# Patient Record
Sex: Male | Born: 1935
Health system: Southern US, Community
[De-identification: ages and names within clinical notes are randomized; demographics above are authoritative.]

## PROBLEM LIST (undated history)

## (undated) DIAGNOSIS — J449 Chronic obstructive pulmonary disease, unspecified: Secondary | ICD-10-CM

## (undated) DIAGNOSIS — G459 Transient cerebral ischemic attack, unspecified: Secondary | ICD-10-CM

## (undated) DIAGNOSIS — E079 Disorder of thyroid, unspecified: Secondary | ICD-10-CM

## (undated) DIAGNOSIS — E785 Hyperlipidemia, unspecified: Secondary | ICD-10-CM

## (undated) DIAGNOSIS — J45909 Unspecified asthma, uncomplicated: Secondary | ICD-10-CM

## (undated) DIAGNOSIS — I350 Nonrheumatic aortic (valve) stenosis: Secondary | ICD-10-CM

## (undated) DIAGNOSIS — I359 Nonrheumatic aortic valve disorder, unspecified: Secondary | ICD-10-CM

## (undated) DIAGNOSIS — I639 Cerebral infarction, unspecified: Secondary | ICD-10-CM

## (undated) DIAGNOSIS — C4490 Unspecified malignant neoplasm of skin, unspecified: Secondary | ICD-10-CM

## (undated) DIAGNOSIS — K922 Gastrointestinal hemorrhage, unspecified: Secondary | ICD-10-CM

## (undated) DIAGNOSIS — H269 Unspecified cataract: Secondary | ICD-10-CM

## (undated) DIAGNOSIS — I610 Nontraumatic intracerebral hemorrhage in hemisphere, subcortical: Secondary | ICD-10-CM

## (undated) DIAGNOSIS — C189 Malignant neoplasm of colon, unspecified: Secondary | ICD-10-CM

## (undated) DIAGNOSIS — E039 Hypothyroidism, unspecified: Secondary | ICD-10-CM

## (undated) DIAGNOSIS — I10 Essential (primary) hypertension: Secondary | ICD-10-CM

## (undated) DIAGNOSIS — K219 Gastro-esophageal reflux disease without esophagitis: Secondary | ICD-10-CM

## (undated) DIAGNOSIS — H409 Unspecified glaucoma: Secondary | ICD-10-CM

## (undated) DIAGNOSIS — N189 Chronic kidney disease, unspecified: Secondary | ICD-10-CM

## (undated) DIAGNOSIS — D500 Iron deficiency anemia secondary to blood loss (chronic): Secondary | ICD-10-CM

## (undated) HISTORY — PX: COLON SURGERY: SHX602

## (undated) HISTORY — DX: Chronic obstructive pulmonary disease, unspecified: J44.9

## (undated) HISTORY — DX: Cerebral infarction, unspecified: I63.9

## (undated) HISTORY — DX: Malignant neoplasm of colon, unspecified: C18.9

## (undated) HISTORY — DX: Unspecified glaucoma: H40.9

## (undated) HISTORY — DX: Unspecified cataract: H26.9

## (undated) HISTORY — DX: Iron deficiency anemia secondary to blood loss (chronic): D50.0

## (undated) HISTORY — PX: EYE SURGERY: SHX253

## (undated) HISTORY — DX: Gastrointestinal hemorrhage, unspecified: K92.2

## (undated) HISTORY — PX: JOINT REPLACEMENT: SHX530

## (undated) HISTORY — PX: COLON RESECTION: SHX5231

## (undated) HISTORY — DX: Disorder of thyroid, unspecified: E07.9

## (undated) HISTORY — DX: Chronic kidney disease, unspecified: N18.9

## (undated) HISTORY — DX: Essential (primary) hypertension: I10

## (undated) HISTORY — PX: REPLACEMENT TOTAL KNEE: SUR1224

## (undated) HISTORY — DX: Hyperlipidemia, unspecified: E78.5

---

## 2011-04-16 DIAGNOSIS — C44621 Squamous cell carcinoma of skin of unspecified upper limb, including shoulder: Secondary | ICD-10-CM | POA: Diagnosis not present

## 2011-04-16 DIAGNOSIS — L905 Scar conditions and fibrosis of skin: Secondary | ICD-10-CM | POA: Diagnosis not present

## 2011-06-30 DIAGNOSIS — E785 Hyperlipidemia, unspecified: Secondary | ICD-10-CM | POA: Diagnosis not present

## 2011-06-30 DIAGNOSIS — E039 Hypothyroidism, unspecified: Secondary | ICD-10-CM | POA: Diagnosis not present

## 2011-06-30 DIAGNOSIS — I10 Essential (primary) hypertension: Secondary | ICD-10-CM | POA: Diagnosis not present

## 2011-07-02 DIAGNOSIS — H02839 Dermatochalasis of unspecified eye, unspecified eyelid: Secondary | ICD-10-CM | POA: Diagnosis not present

## 2011-07-02 DIAGNOSIS — H0289 Other specified disorders of eyelid: Secondary | ICD-10-CM | POA: Diagnosis not present

## 2011-07-02 DIAGNOSIS — H4011X0 Primary open-angle glaucoma, stage unspecified: Secondary | ICD-10-CM | POA: Diagnosis not present

## 2011-07-02 DIAGNOSIS — H409 Unspecified glaucoma: Secondary | ICD-10-CM | POA: Diagnosis not present

## 2011-07-02 DIAGNOSIS — Z961 Presence of intraocular lens: Secondary | ICD-10-CM | POA: Diagnosis not present

## 2011-07-09 DIAGNOSIS — L578 Other skin changes due to chronic exposure to nonionizing radiation: Secondary | ICD-10-CM | POA: Diagnosis not present

## 2011-07-09 DIAGNOSIS — L570 Actinic keratosis: Secondary | ICD-10-CM | POA: Diagnosis not present

## 2011-07-09 DIAGNOSIS — L820 Inflamed seborrheic keratosis: Secondary | ICD-10-CM | POA: Diagnosis not present

## 2011-07-09 DIAGNOSIS — Z85828 Personal history of other malignant neoplasm of skin: Secondary | ICD-10-CM | POA: Diagnosis not present

## 2011-07-30 DIAGNOSIS — R972 Elevated prostate specific antigen [PSA]: Secondary | ICD-10-CM | POA: Diagnosis not present

## 2011-08-06 DIAGNOSIS — H905 Unspecified sensorineural hearing loss: Secondary | ICD-10-CM | POA: Diagnosis not present

## 2011-08-18 DIAGNOSIS — R0989 Other specified symptoms and signs involving the circulatory and respiratory systems: Secondary | ICD-10-CM | POA: Diagnosis not present

## 2011-08-18 DIAGNOSIS — I10 Essential (primary) hypertension: Secondary | ICD-10-CM | POA: Diagnosis not present

## 2011-08-18 DIAGNOSIS — R9430 Abnormal result of cardiovascular function study, unspecified: Secondary | ICD-10-CM | POA: Diagnosis not present

## 2011-08-19 DIAGNOSIS — H903 Sensorineural hearing loss, bilateral: Secondary | ICD-10-CM | POA: Diagnosis not present

## 2011-12-31 DIAGNOSIS — H2640 Unspecified secondary cataract: Secondary | ICD-10-CM | POA: Diagnosis not present

## 2011-12-31 DIAGNOSIS — Z961 Presence of intraocular lens: Secondary | ICD-10-CM | POA: Diagnosis not present

## 2011-12-31 DIAGNOSIS — H409 Unspecified glaucoma: Secondary | ICD-10-CM | POA: Diagnosis not present

## 2011-12-31 DIAGNOSIS — H4011X0 Primary open-angle glaucoma, stage unspecified: Secondary | ICD-10-CM | POA: Diagnosis not present

## 2011-12-31 DIAGNOSIS — H01009 Unspecified blepharitis unspecified eye, unspecified eyelid: Secondary | ICD-10-CM | POA: Diagnosis not present

## 2011-12-31 DIAGNOSIS — H02839 Dermatochalasis of unspecified eye, unspecified eyelid: Secondary | ICD-10-CM | POA: Diagnosis not present

## 2011-12-31 DIAGNOSIS — H0289 Other specified disorders of eyelid: Secondary | ICD-10-CM | POA: Diagnosis not present

## 2011-12-31 DIAGNOSIS — Z9849 Cataract extraction status, unspecified eye: Secondary | ICD-10-CM | POA: Diagnosis not present

## 2012-01-08 DIAGNOSIS — J9819 Other pulmonary collapse: Secondary | ICD-10-CM | POA: Diagnosis not present

## 2012-01-08 DIAGNOSIS — R918 Other nonspecific abnormal finding of lung field: Secondary | ICD-10-CM | POA: Diagnosis not present

## 2012-01-08 DIAGNOSIS — J984 Other disorders of lung: Secondary | ICD-10-CM | POA: Diagnosis not present

## 2012-01-12 DIAGNOSIS — Z23 Encounter for immunization: Secondary | ICD-10-CM | POA: Diagnosis not present

## 2012-01-12 DIAGNOSIS — J45909 Unspecified asthma, uncomplicated: Secondary | ICD-10-CM | POA: Diagnosis not present

## 2012-01-13 DIAGNOSIS — L578 Other skin changes due to chronic exposure to nonionizing radiation: Secondary | ICD-10-CM | POA: Diagnosis not present

## 2012-01-13 DIAGNOSIS — Z85828 Personal history of other malignant neoplasm of skin: Secondary | ICD-10-CM | POA: Diagnosis not present

## 2012-01-13 DIAGNOSIS — L821 Other seborrheic keratosis: Secondary | ICD-10-CM | POA: Diagnosis not present

## 2012-01-13 DIAGNOSIS — L538 Other specified erythematous conditions: Secondary | ICD-10-CM | POA: Diagnosis not present

## 2012-02-04 DIAGNOSIS — Z96659 Presence of unspecified artificial knee joint: Secondary | ICD-10-CM | POA: Diagnosis not present

## 2012-02-04 DIAGNOSIS — Z471 Aftercare following joint replacement surgery: Secondary | ICD-10-CM | POA: Diagnosis not present

## 2012-02-04 DIAGNOSIS — Z09 Encounter for follow-up examination after completed treatment for conditions other than malignant neoplasm: Secondary | ICD-10-CM | POA: Diagnosis not present

## 2012-06-16 ENCOUNTER — Ambulatory Visit (INDEPENDENT_AMBULATORY_CARE_PROVIDER_SITE_OTHER): Payer: Medicare Other | Admitting: Sports Medicine

## 2012-06-16 ENCOUNTER — Encounter: Payer: Self-pay | Admitting: Sports Medicine

## 2012-06-16 VITALS — BP 151/87 | HR 69 | Ht 70.0 in | Wt 203.0 lb

## 2012-06-16 DIAGNOSIS — E785 Hyperlipidemia, unspecified: Secondary | ICD-10-CM | POA: Insufficient documentation

## 2012-06-16 DIAGNOSIS — E039 Hypothyroidism, unspecified: Secondary | ICD-10-CM | POA: Diagnosis not present

## 2012-06-16 DIAGNOSIS — Z299 Encounter for prophylactic measures, unspecified: Secondary | ICD-10-CM

## 2012-06-16 DIAGNOSIS — Z0000 Encounter for general adult medical examination without abnormal findings: Secondary | ICD-10-CM | POA: Diagnosis not present

## 2012-06-16 DIAGNOSIS — H409 Unspecified glaucoma: Secondary | ICD-10-CM | POA: Insufficient documentation

## 2012-06-16 DIAGNOSIS — J309 Allergic rhinitis, unspecified: Secondary | ICD-10-CM

## 2012-06-16 DIAGNOSIS — K219 Gastro-esophageal reflux disease without esophagitis: Secondary | ICD-10-CM | POA: Insufficient documentation

## 2012-06-16 DIAGNOSIS — N400 Enlarged prostate without lower urinary tract symptoms: Secondary | ICD-10-CM | POA: Insufficient documentation

## 2012-06-16 DIAGNOSIS — J387 Other diseases of larynx: Secondary | ICD-10-CM

## 2012-06-16 DIAGNOSIS — I10 Essential (primary) hypertension: Secondary | ICD-10-CM | POA: Insufficient documentation

## 2012-06-16 DIAGNOSIS — J3089 Other allergic rhinitis: Secondary | ICD-10-CM | POA: Insufficient documentation

## 2012-06-16 DIAGNOSIS — J0100 Acute maxillary sinusitis, unspecified: Secondary | ICD-10-CM | POA: Insufficient documentation

## 2012-06-16 MED ORDER — OMEPRAZOLE 40 MG PO CPDR: 40.0000 mg | DELAYED_RELEASE_CAPSULE | Freq: Every day | ORAL | Status: DC

## 2012-06-16 MED ORDER — FINASTERIDE 1 MG PO TABS: 0.5000 mg | ORAL_TABLET | Freq: Every day | ORAL | Status: DC

## 2012-06-16 MED ORDER — AZITHROMYCIN 250 MG PO TABS: ORAL_TABLET | ORAL | Status: DC

## 2012-06-16 NOTE — Assessment & Plan Note (Signed)
-   Continue Flonase  °

## 2012-06-16 NOTE — Assessment & Plan Note (Signed)
Stable, checking TSH.

## 2012-06-16 NOTE — Assessment & Plan Note (Signed)
Continue current medications, refilling finasteride.

## 2012-06-16 NOTE — Assessment & Plan Note (Signed)
Physical exam performed today. Up-to-date on screening and vaccines. Colonoscopy 5 years ago was negative, Tdap 4 years ago, Pneumovax 5 years ago, Zostavax 3 years ago. Discussed PSA screening we will not proceed with this.

## 2012-06-16 NOTE — Assessment & Plan Note (Signed)
Continue eyedrops 

## 2012-06-16 NOTE — Assessment & Plan Note (Signed)
Continue current medications, recheck in one month.

## 2012-06-16 NOTE — Progress Notes (Signed)
Subjective:    CC: Establish care.   HPI: Marcus Orr is a very pleasant 79 year old male, he is my father-in-law's father. His current primary care physician passed away and he is looking for another one. He has no complaints with the exception of sinus drainage and pressure as well as hoarseness and throat clearing during the day.  Hypertension: Controlled.  Hypothyroidism: Controlled.  Hyperlipidemia: Controlled in the past.  Glaucoma: Controlled with current medications.  Perennial rhinitis: Controlled fairly well but symptoms are only at night, controlled with Flonase.  BPH: Well controlled with terazosin and finasteride.  Preventative measures: Up-to-date, see assessment and plan. Patient does desire complete physical today.  Past medical history, Surgical history, Family history not pertinant except as noted below, Social history, Allergies, and medications have been entered into the medical record, reviewed, and no changes needed.   Review of Systems: No headache, visual changes, nausea, vomiting, diarrhea, constipation, dizziness, abdominal pain, skin rash, fevers, chills, night sweats, swollen lymph nodes, weight loss, chest pain, body aches, joint swelling, muscle aches, shortness of breath, mood changes, visual or auditory hallucinations.  Objective:    General Appearance: Alert, well developed and well nourished, cooperative, no distress.  Head: Normocephalic, no obvious abnormality  Eyes: PERRL, EOM's intact, conjunctiva and corneas clear.  Nose: Nares symmetrical, septum midline, mucosa pink, clear watery discharge; no sinus tenderness  Throat: Lips, tongue, and mucosa are moist, pink, and intact; teeth intact  Neck: Supple, symmetrical, trachea midline, no adenopathy; thyroid: no enlargement, symmetric,no tenderness/mass/nodules; no carotid bruit, no JVD  Back: Symmetrical, no curvature, ROM normal, no CVA tenderness  Chest/Breast: No mass or tenderness  Lungs: Clear to  auscultation bilaterally, respirations unlabored  Heart: Normal PMI, no lower extremity edema, regular rate & rhythm, S1 and S2 normal, no murmurs, rubs, or gallops. Abdomen: Soft, non-tender, bowel sounds active all four quadrants, no mass, or organomegaly  Musculoskeletal: All joints, extremities examined, non-tender and unremarkable.  Lymphatic: No adenopathy  Skin/Hair/Nails: Skin warm, dry, and intact, no rashes or abnormal dyspigmentation  Neurologic: Alert and oriented x3, no cranial nerve deficits, normal strength and tone, gait steady   Impression and Recommendations:   The patient was counselled, risk factors were discussed, anticipatory guidance given.

## 2012-06-16 NOTE — Assessment & Plan Note (Signed)
Continue simvastatin. Checking lipids and CMET.

## 2012-06-16 NOTE — Assessment & Plan Note (Signed)
Continue Flonase, adding azithromycin 

## 2012-06-16 NOTE — Assessment & Plan Note (Addendum)
With excessive throat clearing and some nasal drainage. Adding Prilosec. Recheck in one month.

## 2012-06-17 LAB — COMPREHENSIVE METABOLIC PANEL
ALT: 18 U/L (ref 0–53)
Albumin: 4.4 g/dL (ref 3.5–5.2)
CO2: 27 mEq/L (ref 19–32)
Chloride: 101 mEq/L (ref 96–112)
Glucose, Bld: 82 mg/dL (ref 70–99)
Potassium: 3.9 mEq/L (ref 3.5–5.3)
Sodium: 138 mEq/L (ref 135–145)
Total Protein: 7.4 g/dL (ref 6.0–8.3)

## 2012-06-17 LAB — LIPID PANEL
Cholesterol: 107 mg/dL (ref 0–200)
HDL: 39 mg/dL — ABNORMAL LOW (ref >39)
LDL Cholesterol: 47 mg/dL (ref 0–99)
Total CHOL/HDL Ratio: 2.7 ratio
Triglycerides: 104 mg/dL (ref <150)
VLDL: 21 mg/dL (ref 0–40)

## 2012-06-17 LAB — CBC
HCT: 40.8 % (ref 39.0–52.0)
Hemoglobin: 14.2 g/dL (ref 13.0–17.0)
MCH: 30.1 pg (ref 26.0–34.0)
MCHC: 34.8 g/dL (ref 30.0–36.0)
MCV: 86.6 fL (ref 78.0–100.0)
Platelets: 299 K/uL (ref 150–400)
RBC: 4.71 MIL/uL (ref 4.22–5.81)
RDW: 14.2 % (ref 11.5–15.5)
WBC: 8.9 K/uL (ref 4.0–10.5)

## 2012-06-17 LAB — TESTOSTERONE, FREE, TOTAL, SHBG
Sex Hormone Binding: 45 nmol/L (ref 13–71)
Testosterone, Free: 40 pg/mL — ABNORMAL LOW (ref 47.0–244.0)
Testosterone-% Free: 1.6 % (ref 1.6–2.9)
Testosterone: 252 ng/dL — ABNORMAL LOW (ref 300–890)

## 2012-06-17 LAB — COMPREHENSIVE METABOLIC PANEL WITH GFR
AST: 21 U/L (ref 0–37)
Alkaline Phosphatase: 65 U/L (ref 39–117)
BUN: 24 mg/dL — ABNORMAL HIGH (ref 6–23)
Calcium: 9.7 mg/dL (ref 8.4–10.5)
Creat: 1.54 mg/dL — ABNORMAL HIGH (ref 0.50–1.35)
Total Bilirubin: 0.6 mg/dL (ref 0.3–1.2)

## 2012-06-17 LAB — TSH: TSH: 3.457 u[IU]/mL (ref 0.350–4.500)

## 2012-06-17 LAB — VITAMIN D 25 HYDROXY (VIT D DEFICIENCY, FRACTURES): Vit D, 25-Hydroxy: 19 ng/mL — ABNORMAL LOW (ref 30–89)

## 2012-06-17 MED ORDER — VITAMIN D (ERGOCALCIFEROL) 1.25 MG (50000 UNIT) PO CAPS: 50000.0000 [IU] | ORAL_CAPSULE | ORAL | Status: DC

## 2012-06-17 NOTE — Addendum Note (Signed)
Addended by: Monica Becton on: 06/17/2012 08:41 AM   Modules accepted: Orders

## 2012-06-21 ENCOUNTER — Other Ambulatory Visit: Payer: Self-pay | Admitting: Sports Medicine

## 2012-07-06 ENCOUNTER — Encounter: Payer: Self-pay | Admitting: Sports Medicine

## 2012-07-06 NOTE — Progress Notes (Signed)
Records reviewed, echocardiogram, Holter monitor, and EKG report scanned in.

## 2012-07-14 ENCOUNTER — Encounter: Payer: Self-pay | Admitting: Sports Medicine

## 2012-07-14 ENCOUNTER — Ambulatory Visit (INDEPENDENT_AMBULATORY_CARE_PROVIDER_SITE_OTHER): Payer: Medicare Other | Admitting: Sports Medicine

## 2012-07-14 VITALS — BP 132/77 | HR 64 | Wt 200.0 lb

## 2012-07-14 DIAGNOSIS — K219 Gastro-esophageal reflux disease without esophagitis: Secondary | ICD-10-CM

## 2012-07-14 DIAGNOSIS — N289 Disorder of kidney and ureter, unspecified: Secondary | ICD-10-CM | POA: Diagnosis not present

## 2012-07-14 DIAGNOSIS — N400 Enlarged prostate without lower urinary tract symptoms: Secondary | ICD-10-CM

## 2012-07-14 MED ORDER — HYDROCHLOROTHIAZIDE 25 MG PO TABS: 25.0000 mg | ORAL_TABLET | Freq: Every day | ORAL | Status: DC

## 2012-07-14 MED ORDER — FLUTICASONE PROPIONATE 50 MCG/ACT NA SUSP: 2.0000 | Freq: Every day | NASAL | Status: DC

## 2012-07-14 MED ORDER — TERAZOSIN HCL 5 MG PO CAPS: 5.0000 mg | ORAL_CAPSULE | Freq: Every day | ORAL | Status: DC

## 2012-07-14 MED ORDER — LISINOPRIL 40 MG PO TABS: 20.0000 mg | ORAL_TABLET | Freq: Every day | ORAL | Status: DC

## 2012-07-14 MED ORDER — FINASTERIDE 1 MG PO TABS: 0.5000 mg | ORAL_TABLET | Freq: Every day | ORAL | Status: DC

## 2012-07-14 MED ORDER — SIMVASTATIN 40 MG PO TABS: 40.0000 mg | ORAL_TABLET | Freq: Every evening | ORAL | Status: DC

## 2012-07-14 MED ORDER — LEVOTHYROXINE SODIUM 75 MCG PO TABS: 75.0000 ug | ORAL_TABLET | Freq: Every day | ORAL | Status: DC

## 2012-07-14 MED ORDER — OMEPRAZOLE 40 MG PO CPDR: 40.0000 mg | DELAYED_RELEASE_CAPSULE | Freq: Every day | ORAL | Status: DC

## 2012-07-14 MED ORDER — DORZOLAMIDE HCL-TIMOLOL MAL 2-0.5 % OP SOLN: 1.0000 [drp] | Freq: Two times a day (BID) | OPHTHALMIC | Status: DC

## 2012-07-14 MED ORDER — ASPIRIN 81 MG PO TABS: 81.0000 mg | ORAL_TABLET | Freq: Every day | ORAL | Status: DC

## 2012-07-14 NOTE — Progress Notes (Signed)
  Subjective:    CC: Followup  HPI: Hypertension: Well controlled.  Hyperlipidemia: Well controlled.  BPH: Well controlled. Does note an occasional, possibly once a month mild presyncope and weakness day in the morning that resolves by midday. He does not wish to change any of his medicines as his symptoms are overall well controlled, and these episodes are rare. He denies any palpitations, chest pain, shortness of breath, nausea during these episodes.  Renal insufficiency: Was mild, but he does understand we need to recheck his creatinine.  Past medical history, Surgical history, Family history not pertinant except as noted below, Social history, Allergies, and medications have been entered into the medical record, reviewed, and no changes needed.   Review of Systems: No fevers, chills, night sweats, weight loss, chest pain, or shortness of breath.   Objective:    General: Well Developed, well nourished, and in no acute distress.  Neuro: Alert and oriented x3, extra-ocular muscles intact, sensation grossly intact.  HEENT: Normocephalic, atraumatic, pupils equal round reactive to light, neck supple, no masses, no lymphadenopathy, thyroid nonpalpable.  Skin: Warm and dry, no rashes. Cardiac: Regular rate and rhythm, no murmurs rubs or gallops.  Respiratory: Clear to auscultation bilaterally. Not using accessory muscles, speaking in full sentences. Impression and Recommendations:

## 2012-07-15 DIAGNOSIS — L570 Actinic keratosis: Secondary | ICD-10-CM | POA: Diagnosis not present

## 2012-07-15 DIAGNOSIS — L578 Other skin changes due to chronic exposure to nonionizing radiation: Secondary | ICD-10-CM | POA: Diagnosis not present

## 2012-07-15 DIAGNOSIS — Z85828 Personal history of other malignant neoplasm of skin: Secondary | ICD-10-CM | POA: Diagnosis not present

## 2012-07-15 DIAGNOSIS — L820 Inflamed seborrheic keratosis: Secondary | ICD-10-CM | POA: Diagnosis not present

## 2012-07-15 LAB — RENAL FUNCTION PANEL
Albumin: 4.3 g/dL (ref 3.5–5.2)
BUN: 20 mg/dL (ref 6–23)
CO2: 30 meq/L (ref 19–32)
Calcium: 9.3 mg/dL (ref 8.4–10.5)
Chloride: 101 mEq/L (ref 96–112)
Creat: 1.76 mg/dL — ABNORMAL HIGH (ref 0.50–1.35)
Glucose, Bld: 88 mg/dL (ref 70–99)
Phosphorus: 3 mg/dL (ref 2.3–4.6)
Potassium: 3.9 mEq/L (ref 3.5–5.3)
Sodium: 139 meq/L (ref 135–145)

## 2012-07-15 NOTE — Addendum Note (Signed)
Addended by: Monica Becton on: 07/15/2012 09:32 AM   Modules accepted: Orders, Medications

## 2012-07-15 NOTE — Assessment & Plan Note (Signed)
Worsened on recheck. Discontinuing lisinopril, I would like to recheck in a month. If continues to worsen we'll initiate the renal failure workup.

## 2012-07-24 ENCOUNTER — Other Ambulatory Visit: Payer: Self-pay | Admitting: Sports Medicine

## 2012-08-11 ENCOUNTER — Other Ambulatory Visit: Payer: Self-pay | Admitting: *Deleted

## 2012-08-11 DIAGNOSIS — N289 Disorder of kidney and ureter, unspecified: Secondary | ICD-10-CM | POA: Diagnosis not present

## 2012-08-11 DIAGNOSIS — I10 Essential (primary) hypertension: Secondary | ICD-10-CM

## 2012-08-12 DIAGNOSIS — N289 Disorder of kidney and ureter, unspecified: Secondary | ICD-10-CM | POA: Diagnosis not present

## 2012-08-12 LAB — BASIC METABOLIC PANEL
BUN: 22 mg/dL (ref 6–23)
CO2: 28 mEq/L (ref 19–32)
Chloride: 103 mEq/L (ref 96–112)
Creat: 1.64 mg/dL — ABNORMAL HIGH (ref 0.50–1.35)

## 2012-08-12 LAB — BASIC METABOLIC PANEL WITH GFR
Calcium: 9 mg/dL (ref 8.4–10.5)
Glucose, Bld: 101 mg/dL — ABNORMAL HIGH (ref 70–99)
Potassium: 3.6 meq/L (ref 3.5–5.3)
Sodium: 139 meq/L (ref 135–145)

## 2012-08-13 ENCOUNTER — Other Ambulatory Visit: Payer: Self-pay | Admitting: *Deleted

## 2012-08-13 DIAGNOSIS — I10 Essential (primary) hypertension: Secondary | ICD-10-CM

## 2012-08-13 MED ORDER — AMLODIPINE BESYLATE 10 MG PO TABS: 5.0000 mg | ORAL_TABLET | Freq: Every day | ORAL | Status: DC

## 2012-08-13 NOTE — Assessment & Plan Note (Signed)
Blood pressures run 190s in the morning, and dropped to 120s in the evening. Continue to hold off on lisinopril. Adding amlodipine 5 mg at bedtime. Decrease hydrochlorothiazide to 12.5 mg. Return to see me in one month.

## 2012-08-18 DIAGNOSIS — H02839 Dermatochalasis of unspecified eye, unspecified eyelid: Secondary | ICD-10-CM | POA: Diagnosis not present

## 2012-08-18 DIAGNOSIS — H0289 Other specified disorders of eyelid: Secondary | ICD-10-CM | POA: Diagnosis not present

## 2012-08-18 DIAGNOSIS — H4011X0 Primary open-angle glaucoma, stage unspecified: Secondary | ICD-10-CM | POA: Diagnosis not present

## 2012-08-18 DIAGNOSIS — H409 Unspecified glaucoma: Secondary | ICD-10-CM | POA: Diagnosis not present

## 2012-08-18 DIAGNOSIS — Z961 Presence of intraocular lens: Secondary | ICD-10-CM | POA: Diagnosis not present

## 2012-09-13 ENCOUNTER — Ambulatory Visit (INDEPENDENT_AMBULATORY_CARE_PROVIDER_SITE_OTHER): Payer: Medicare Other | Admitting: Sports Medicine

## 2012-09-13 ENCOUNTER — Encounter: Payer: Self-pay | Admitting: Sports Medicine

## 2012-09-13 VITALS — BP 121/76 | HR 79 | Wt 203.0 lb

## 2012-09-13 DIAGNOSIS — N289 Disorder of kidney and ureter, unspecified: Secondary | ICD-10-CM | POA: Diagnosis not present

## 2012-09-13 DIAGNOSIS — I10 Essential (primary) hypertension: Secondary | ICD-10-CM

## 2012-09-13 NOTE — Assessment & Plan Note (Signed)
Rechecking creatinine. He is now off of lisinopril and blood pressure is extremely well-controlled on amlodipine.

## 2012-09-13 NOTE — Progress Notes (Signed)
  Subjective:    CC: Followup  HPI: Hypertension: We stopped Azul's lisinopril due to worsening renal function, I changed this to amlodipine, and blood pressures now are extremely well controlled. He does note a few episodes approximately once a month with his blood pressure will drop very low, to the 60-70 systolic, and he thinks his pulse drops into the 40s to 50s based on home blood pressure monitoring device. There is no inciting cause, it's not associated with exertion, he is not dehydrated, it occurs randomly. He denies any chest pain, neck stiffness, left arm pain, nausea, or diaphoresis during these episodes. He simply feels weak.  Hyperlipidemia: Stable.  Hypothyroidism: Stable.  Past medical history, Surgical history, Family history not pertinant except as noted below, Social history, Allergies, and medications have been entered into the medical record, reviewed, and no changes needed.   Review of Systems: No fevers, chills, night sweats, weight loss, chest pain, or shortness of breath.   Objective:    General: Well Developed, well nourished, and in no acute distress.  Neuro: Alert and oriented x3, extra-ocular muscles intact, sensation grossly intact.  HEENT: Normocephalic, atraumatic, pupils equal round reactive to light, neck supple, no masses, no lymphadenopathy, thyroid nonpalpable.  Skin: Warm and dry, no rashes. Cardiac: Regular rate and rhythm, no murmurs rubs or gallops, no lower extremity edema.  Respiratory: Clear to auscultation bilaterally. Not using accessory muscles, speaking in full sentences. Impression and Recommendations:

## 2012-09-13 NOTE — Assessment & Plan Note (Signed)
Extremely well controlled today on amlodipine 5 mg daily. To recap, he has had episodes of hypotension that are fairly rare, approximately monthly. I did have him check his vital signs and he reports that his blood pressure dropped into the 60s to 70s systolic, with a pulse in the 16X to 50s. He's not entirely sure if you or murmurs his correctly, but he wants to check it again, but I do think that this is a sign of a bradyarrhythmia. He has no symptoms of ischemia during this time. As it happened so rare I do suspect he will likely need a loop recorder implanted rather than simply a Holter monitor. He wants to look into it, recheck his vitals the next time this happens, and then will come see me in one month. I have recommended that we have him see the cardiologist for consideration of implantation of this loop recorder.

## 2012-09-14 LAB — BASIC METABOLIC PANEL WITH GFR
CO2: 29 meq/L (ref 19–32)
Calcium: 8.9 mg/dL (ref 8.4–10.5)
Chloride: 104 meq/L (ref 96–112)
Glucose, Bld: 90 mg/dL (ref 70–99)
Potassium: 3.7 meq/L (ref 3.5–5.3)
Sodium: 139 meq/L (ref 135–145)

## 2012-09-14 LAB — BASIC METABOLIC PANEL
BUN: 19 mg/dL (ref 6–23)
Creat: 1.41 mg/dL — ABNORMAL HIGH (ref 0.50–1.35)

## 2012-10-11 ENCOUNTER — Ambulatory Visit: Payer: Medicare Other | Admitting: Sports Medicine

## 2012-10-12 ENCOUNTER — Encounter: Payer: Self-pay | Admitting: Sports Medicine

## 2012-10-12 ENCOUNTER — Ambulatory Visit (INDEPENDENT_AMBULATORY_CARE_PROVIDER_SITE_OTHER): Payer: Medicare Other

## 2012-10-12 ENCOUNTER — Ambulatory Visit (INDEPENDENT_AMBULATORY_CARE_PROVIDER_SITE_OTHER): Payer: Medicare Other | Admitting: Sports Medicine

## 2012-10-12 VITALS — BP 142/82 | HR 69 | Wt 207.0 lb

## 2012-10-12 DIAGNOSIS — R062 Wheezing: Secondary | ICD-10-CM

## 2012-10-12 DIAGNOSIS — K219 Gastro-esophageal reflux disease without esophagitis: Secondary | ICD-10-CM

## 2012-10-12 DIAGNOSIS — J387 Other diseases of larynx: Secondary | ICD-10-CM

## 2012-10-12 DIAGNOSIS — N289 Disorder of kidney and ureter, unspecified: Secondary | ICD-10-CM

## 2012-10-12 DIAGNOSIS — R0602 Shortness of breath: Secondary | ICD-10-CM

## 2012-10-12 DIAGNOSIS — I10 Essential (primary) hypertension: Secondary | ICD-10-CM | POA: Diagnosis not present

## 2012-10-12 NOTE — Assessment & Plan Note (Signed)
Improved at the last visit.

## 2012-10-12 NOTE — Assessment & Plan Note (Signed)
We need to keep this in the back of our minds as a possible cause of the shortness of breath/cough/wheeze.

## 2012-10-12 NOTE — Assessment & Plan Note (Signed)
With associated cough, has been present for years, but worse when working outside in the dust. He did have a history of asthma as a child. I would like a chest x-ray, and spirometry. Come back for spirometry.

## 2012-10-12 NOTE — Assessment & Plan Note (Signed)
Doing extremely well. Episodes of hypotension and bradycardia have not occurred. If they continue to recur, he will need a loop recorder implanted.

## 2012-10-12 NOTE — Progress Notes (Signed)
  Subjective:    CC: Followup  HPI: Hypertension: Marcus Orr unfortunately had a few episodes of hypotension and bradycardia. We decreased his blood pressure medications, and this has since resolved. He has not had any further episodes.  Cough/shortness of breath: Marcus Orr has a history of asthma as a child, for the past few months he's noted mild shortness of breath, with a cough and a wheeze that is nonproductive, working outside in exposed to dust. Symptoms are mild, persistent. He denies any constitutional symptoms.  Renal insufficiency: Improved at the last visit.  Past medical history, Surgical history, Family history not pertinant except as noted below, Social history, Allergies, and medications have been entered into the medical record, reviewed, and no changes needed.   Review of Systems: No fevers, chills, night sweats, weight loss, chest pain, or shortness of breath.   Objective:    General: Well Developed, well nourished, and in no acute distress.  Neuro: Alert and oriented x3, extra-ocular muscles intact, sensation grossly intact.  HEENT: Normocephalic, atraumatic, pupils equal round reactive to light, neck supple, no masses, no lymphadenopathy, thyroid nonpalpable.  Skin: Warm and dry, no rashes. Cardiac: Regular rate and rhythm, no murmurs rubs or gallops, no lower extremity edema.  Respiratory: Clear to auscultation bilaterally. Not using accessory muscles, speaking in full sentences. Impression and Recommendations:

## 2012-10-22 ENCOUNTER — Ambulatory Visit (INDEPENDENT_AMBULATORY_CARE_PROVIDER_SITE_OTHER): Payer: Medicare Other | Admitting: Sports Medicine

## 2012-10-22 ENCOUNTER — Ambulatory Visit: Payer: Medicare Other | Admitting: Sports Medicine

## 2012-10-22 ENCOUNTER — Encounter: Payer: Self-pay | Admitting: Sports Medicine

## 2012-10-22 VITALS — BP 153/89 | HR 63 | Wt 200.0 lb

## 2012-10-22 DIAGNOSIS — I959 Hypotension, unspecified: Secondary | ICD-10-CM

## 2012-10-22 LAB — BASIC METABOLIC PANEL
CO2: 29 mEq/L (ref 19–32)
Calcium: 9.2 mg/dL (ref 8.4–10.5)
Chloride: 104 mEq/L (ref 96–112)
Sodium: 140 mEq/L (ref 135–145)

## 2012-10-22 LAB — BASIC METABOLIC PANEL WITH GFR
BUN: 21 mg/dL (ref 6–23)
Creat: 1.57 mg/dL — ABNORMAL HIGH (ref 0.50–1.35)
Glucose, Bld: 93 mg/dL (ref 70–99)
Potassium: 4 meq/L (ref 3.5–5.3)

## 2012-10-22 LAB — CBC
HCT: 38.1 % — ABNORMAL LOW (ref 39.0–52.0)
Hemoglobin: 13 g/dL (ref 13.0–17.0)
MCH: 29.4 pg (ref 26.0–34.0)
MCHC: 34.1 g/dL (ref 30.0–36.0)
MCV: 86.2 fL (ref 78.0–100.0)
Platelets: 325 10*3/uL (ref 150–400)
RBC: 4.42 MIL/uL (ref 4.22–5.81)
RDW: 14.5 % (ref 11.5–15.5)
WBC: 5.7 10*3/uL (ref 4.0–10.5)

## 2012-10-22 NOTE — Assessment & Plan Note (Signed)
Marcus Orr had a negative cardiac treadmill stress test approximately a year ago. Unfortunately when he exerts himself he gets episodes of hypotension, with blood pressures in the 80s systolic. We decreased his blood pressure medicine over a month ago, symptoms improved, but unfortunately yesterday he had another episode of relative hypotension, but feels as though his pulse rate was in the 50s during this episode. At this point I do think we need to pursue outpatient rhythm monitoring. I would like him to see his cardiologist for consideration of Holter monitor placement. I will check electrolytes, CBC, as well as cortisol levels.

## 2012-10-22 NOTE — Progress Notes (Signed)
  Subjective:    CC: Followup low blood pressures  HPI: Marcus Orr is a very pleasant 79 year old male with hypothyroidism, hyperlipidemia, hypertension.  I saw him a few months ago with episodes of hypotension to 80 systolic, we backed off of his blood pressure medicine and this improved, unfortunately he has been having further episodes almost daily of fatigue, and presyncope with exertion. He does endorse some family stress, his mother in law has moved into the house. Unfortunately these episodes persist for minutes to hours. He denies any chest pain, palpitations, visual changes during these episodes. No nausea, no diaphoresis. His wife checked his blood pressure during the episode and it was 80 systolic. He did have a treadmill stress test approximately a year ago that was negative. He has never had outpatient with monitoring. I actually discussed this with him last night, and recommended hospitalization with telemetry, but he declines this.  Past medical history, Surgical history, Family history not pertinant except as noted below, Social history, Allergies, and medications have been entered into the medical record, reviewed, and no changes needed.   Review of Systems: No fevers, chills, night sweats, weight loss, chest pain, or shortness of breath.   Objective:    General: Well Developed, well nourished, and in no acute distress.  Neuro: Alert and oriented x3, extra-ocular muscles intact, sensation grossly intact.  HEENT: Normocephalic, atraumatic, pupils equal round reactive to light, neck supple, no masses, no lymphadenopathy, thyroid nonpalpable.  Skin: Warm and dry, no rashes. Cardiac: Regular rate and rhythm, no murmurs rubs or gallops, no lower extremity edema.  Respiratory: Clear to auscultation bilaterally. Not using accessory muscles, speaking in full sentences.  12-lead EKG was reviewed, he is in sinus bradycardia with a rate of about 50, axis is normal, there are no T wave  abnormalities, PR interval is at the upper limit of normal.  Impression and Recommendations:

## 2012-10-25 LAB — CORTISOL, FREE

## 2012-10-26 ENCOUNTER — Other Ambulatory Visit: Payer: Medicare Other | Admitting: Sports Medicine

## 2012-10-26 DIAGNOSIS — E785 Hyperlipidemia, unspecified: Secondary | ICD-10-CM | POA: Diagnosis not present

## 2012-10-26 DIAGNOSIS — I498 Other specified cardiac arrhythmias: Secondary | ICD-10-CM | POA: Diagnosis not present

## 2012-10-26 DIAGNOSIS — I10 Essential (primary) hypertension: Secondary | ICD-10-CM | POA: Diagnosis not present

## 2012-10-28 ENCOUNTER — Encounter: Payer: Self-pay | Admitting: Sports Medicine

## 2012-10-28 ENCOUNTER — Ambulatory Visit: Payer: PRIVATE HEALTH INSURANCE | Admitting: Sports Medicine

## 2012-10-28 VITALS — BP 107/65 | HR 84 | Wt 202.0 lb

## 2012-10-28 DIAGNOSIS — Z299 Encounter for prophylactic measures, unspecified: Secondary | ICD-10-CM

## 2012-10-28 DIAGNOSIS — I959 Hypotension, unspecified: Secondary | ICD-10-CM

## 2012-10-28 DIAGNOSIS — R0602 Shortness of breath: Secondary | ICD-10-CM | POA: Insufficient documentation

## 2012-10-28 NOTE — Assessment & Plan Note (Signed)
Currently resolved, spirometry pre-and postbronchodilator is negative Return as needed.

## 2012-10-28 NOTE — Progress Notes (Signed)
  Subjective:    CC: Followup  HPI: Shortness of breath: Chest x-ray was negative, symptoms are likely related to seasonal inhaled allergens. We did do a pulmonary function test/spirometry today. Results will be dictated below. Shortness of breath has resolved.  Episodic hypotension: Blood pressures have dropped to 80 systolic, we backed off of his blood pressure medications but symptoms continue to recur, he declined any inpatient monitoring or evaluation, so we decided to proceed with outpatient rhythm monitoring, he recently saw the cardiologist who is setting up event monitoring. I am awaiting results.  Past medical history, Surgical history, Family history not pertinant except as noted below, Social history, Allergies, and medications have been entered into the medical record, reviewed, and no changes needed.   Review of Systems: No fevers, chills, night sweats, weight loss, chest pain, or shortness of breath.   Objective:    General: Well Developed, well nourished, and in no acute distress.  Neuro: Alert and oriented x3, extra-ocular muscles intact, sensation grossly intact.  HEENT: Normocephalic, atraumatic, pupils equal round reactive to light, neck supple, no masses, no lymphadenopathy, thyroid nonpalpable.  Skin: Warm and dry, no rashes. Cardiac: Regular rate and rhythm, no murmurs rubs or gallops, no lower extremity edema.  Respiratory: Clear to auscultation bilaterally. Not using accessory muscles, speaking in full sentences.  Spirometry was evaluated, pre-and post bronchodilator spirometry were both normal without any significant change after bronchodilator.  Impression and Recommendations:

## 2012-10-28 NOTE — Assessment & Plan Note (Signed)
Awaiting event recorder for cardiac electrical rhythm monitoring period outpatient.

## 2012-10-29 ENCOUNTER — Telehealth: Payer: Self-pay | Admitting: *Deleted

## 2012-10-29 DIAGNOSIS — R0602 Shortness of breath: Secondary | ICD-10-CM

## 2012-10-29 NOTE — Telephone Encounter (Signed)
ekg ordered

## 2012-11-01 DIAGNOSIS — I498 Other specified cardiac arrhythmias: Secondary | ICD-10-CM | POA: Diagnosis not present

## 2012-11-04 ENCOUNTER — Encounter: Payer: Self-pay | Admitting: Sports Medicine

## 2012-11-14 DIAGNOSIS — I440 Atrioventricular block, first degree: Secondary | ICD-10-CM | POA: Diagnosis not present

## 2012-11-14 DIAGNOSIS — I498 Other specified cardiac arrhythmias: Secondary | ICD-10-CM | POA: Diagnosis not present

## 2012-11-19 ENCOUNTER — Ambulatory Visit: Payer: Medicare Other | Admitting: Sports Medicine

## 2012-11-23 ENCOUNTER — Ambulatory Visit: Payer: Medicare Other | Admitting: Sports Medicine

## 2012-12-08 ENCOUNTER — Ambulatory Visit (INDEPENDENT_AMBULATORY_CARE_PROVIDER_SITE_OTHER): Payer: Medicare Other | Admitting: Sports Medicine

## 2012-12-08 ENCOUNTER — Encounter: Payer: Self-pay | Admitting: Sports Medicine

## 2012-12-08 VITALS — BP 138/80 | HR 67 | Wt 203.0 lb

## 2012-12-08 DIAGNOSIS — D485 Neoplasm of uncertain behavior of skin: Secondary | ICD-10-CM | POA: Diagnosis not present

## 2012-12-08 DIAGNOSIS — I10 Essential (primary) hypertension: Secondary | ICD-10-CM

## 2012-12-08 DIAGNOSIS — N400 Enlarged prostate without lower urinary tract symptoms: Secondary | ICD-10-CM

## 2012-12-08 DIAGNOSIS — L989 Disorder of the skin and subcutaneous tissue, unspecified: Secondary | ICD-10-CM | POA: Diagnosis not present

## 2012-12-08 DIAGNOSIS — H6191 Disorder of right external ear, unspecified: Secondary | ICD-10-CM | POA: Insufficient documentation

## 2012-12-08 DIAGNOSIS — I959 Hypotension, unspecified: Secondary | ICD-10-CM | POA: Diagnosis not present

## 2012-12-08 MED ORDER — FINASTERIDE 1 MG PO TABS: 1.0000 mg | ORAL_TABLET | Freq: Every day | ORAL | Status: DC

## 2012-12-08 NOTE — Assessment & Plan Note (Signed)
This does appear to be a small skin cancer. Cryotherapy performed. Return in a few months to recheck.

## 2012-12-08 NOTE — Assessment & Plan Note (Signed)
Well-controlled, no changes. Outpatient Holter monitoring was negative, he did have an episode while on the monitor, blood pressure dropped to 100 systolic, pulse remained above 60. Overall symptoms are less frequent.

## 2012-12-08 NOTE — Assessment & Plan Note (Signed)
Improved as above and hypertension tab.

## 2012-12-08 NOTE — Progress Notes (Signed)
  Subjective:    CC: Follow up  HPI: BPH: Doing well with finasteride, needs a refill.  Skin lesion: There is a small lesion on his right earlobe, she has had these before, they are small skin cancers, these are then treated in the past with cryotherapy. He cannot get into his dermatologist for some time.  Episodes of hypotension: Outpatient rhythm monitoring was negative, he did have an episode with a monitor in place, overall he feels much better with a decrease in his blood pressure medication. He has fewer episodes of hypotension, and with the most recent episode his blood pressure dropped to systolic 100/70, and pulse remained in the 70s.  Past medical history, Surgical history, Family history not pertinant except as noted below, Social history, Allergies, and medications have been entered into the medical record, reviewed, and no changes needed.   Review of Systems: No fevers, chills, night sweats, weight loss, chest pain, or shortness of breath.   Objective:    General: Well Developed, well nourished, and in no acute distress.  Neuro: Alert and oriented x3, extra-ocular muscles intact, sensation grossly intact.  HEENT: Normocephalic, atraumatic, pupils equal round reactive to light, neck supple, no masses, no lymphadenopathy, thyroid nonpalpable.  Skin: Warm and dry, no rashes. 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 Cardiac: Regular rate and rhythm, no murmurs rubs or gallops, no lower extremity edema.  Respiratory: Clear to auscultation bilaterally. Not using accessory muscles, speaking in full sentences.  Procedure:  Cryodestruction of right earlobe malignant lesion Consent obtained and verified. Time-out conducted. Noted no overlying erythema, induration, or other signs of local infection. Completed without difficulty using Cryo-Gun. This was repeated for a total of 3 freeze/thaw phases. Advised to call if fevers/chills, erythema,  induration, drainage, or persistent bleeding.  Impression and Recommendations:

## 2012-12-08 NOTE — Assessment & Plan Note (Signed)
Refilling , finasteride well controlled.

## 2012-12-20 ENCOUNTER — Telehealth: Payer: Self-pay

## 2012-12-20 NOTE — Telephone Encounter (Signed)
Spoke to Terri from [facility] [phone], she stated that the patient has already picked up Finasteride 1 mg 90 tablets from  the pharmacy and that no PA was required. Rhonda Cunningham,CMA

## 2012-12-22 DIAGNOSIS — R911 Solitary pulmonary nodule: Secondary | ICD-10-CM | POA: Diagnosis not present

## 2012-12-22 DIAGNOSIS — R918 Other nonspecific abnormal finding of lung field: Secondary | ICD-10-CM | POA: Diagnosis not present

## 2012-12-29 DIAGNOSIS — R911 Solitary pulmonary nodule: Secondary | ICD-10-CM | POA: Diagnosis not present

## 2012-12-29 DIAGNOSIS — J45909 Unspecified asthma, uncomplicated: Secondary | ICD-10-CM | POA: Diagnosis not present

## 2013-01-06 ENCOUNTER — Ambulatory Visit: Payer: Medicare Other | Admitting: Sports Medicine

## 2013-01-13 ENCOUNTER — Ambulatory Visit (INDEPENDENT_AMBULATORY_CARE_PROVIDER_SITE_OTHER): Payer: Medicare Other | Admitting: Sports Medicine

## 2013-01-13 ENCOUNTER — Encounter: Payer: Self-pay | Admitting: Sports Medicine

## 2013-01-13 VITALS — BP 134/76 | HR 72 | Wt 202.0 lb

## 2013-01-13 DIAGNOSIS — Z23 Encounter for immunization: Secondary | ICD-10-CM

## 2013-01-13 DIAGNOSIS — H6191 Disorder of right external ear, unspecified: Secondary | ICD-10-CM

## 2013-01-13 DIAGNOSIS — H6190 Disorder of external ear, unspecified, unspecified ear: Secondary | ICD-10-CM

## 2013-01-13 DIAGNOSIS — L570 Actinic keratosis: Secondary | ICD-10-CM

## 2013-01-13 DIAGNOSIS — N400 Enlarged prostate without lower urinary tract symptoms: Secondary | ICD-10-CM

## 2013-01-13 NOTE — Assessment & Plan Note (Signed)
Flat, looks nearly resolved. Repeat cryotherapy performed.

## 2013-01-13 NOTE — Progress Notes (Signed)
  Subjective:    CC: Followup  HPI: BPH: Well controlled on finasteride and Hytrin. 1 mg tablets were over $100 a month, 5 mg tablets are only $14 for 3 months and he would like to switch.  Skin lesion on ear lobe: Improved after initial cryotherapy, repeat cryotherapy as desired today. He also has some other skin lesions on his back.  Past medical history, Surgical history, Family history not pertinant except as noted below, Social history, Allergies, and medications have been entered into the medical record, reviewed, and no changes needed.   Review of Systems: No fevers, chills, night sweats, weight loss, chest pain, or shortness of breath.   Objective:    General: Well Developed, well nourished, and in no acute distress.  Neuro: Alert and oriented x3, extra-ocular muscles intact, sensation grossly intact.  HEENT: Normocephalic, atraumatic, pupils equal round reactive to light, neck supple, no masses, no lymphadenopathy, thyroid nonpalpable.  Skin: Warm and dry, no rashes. Cardiac: Regular rate and rhythm, no murmurs rubs or gallops, no lower extremity edema.  Respiratory: Clear to auscultation bilaterally. Not using accessory muscles, speaking in full sentences.  Procedure:  Cryodestruction of right ear 0.3 cm skin lesion, as well as multiple lesions 0.5 cm on the back that appear to be actinic keratoses. Consent obtained and verified. Time-out conducted. Noted no overlying erythema, induration, or other signs of local infection. Completed without difficulty using Cryo-Gun. Advised to call if fevers/chills, erythema, induration, drainage, or persistent bleeding.  Impression and Recommendations:

## 2013-01-13 NOTE — Assessment & Plan Note (Signed)
On the back. Cryotherapy performed on 3 locations.

## 2013-01-13 NOTE — Assessment & Plan Note (Signed)
Doing extremely well on finasteride 1 mg daily. This was expensive but 5 mg was cheaper.

## 2013-02-23 DIAGNOSIS — H02839 Dermatochalasis of unspecified eye, unspecified eyelid: Secondary | ICD-10-CM | POA: Diagnosis not present

## 2013-02-23 DIAGNOSIS — H4011X0 Primary open-angle glaucoma, stage unspecified: Secondary | ICD-10-CM | POA: Diagnosis not present

## 2013-02-23 DIAGNOSIS — H409 Unspecified glaucoma: Secondary | ICD-10-CM | POA: Diagnosis not present

## 2013-02-23 DIAGNOSIS — Z961 Presence of intraocular lens: Secondary | ICD-10-CM | POA: Diagnosis not present

## 2013-02-23 DIAGNOSIS — H0289 Other specified disorders of eyelid: Secondary | ICD-10-CM | POA: Diagnosis not present

## 2013-03-29 DIAGNOSIS — I498 Other specified cardiac arrhythmias: Secondary | ICD-10-CM | POA: Diagnosis not present

## 2013-03-29 DIAGNOSIS — E785 Hyperlipidemia, unspecified: Secondary | ICD-10-CM | POA: Diagnosis not present

## 2013-03-29 DIAGNOSIS — I10 Essential (primary) hypertension: Secondary | ICD-10-CM | POA: Diagnosis not present

## 2013-07-14 ENCOUNTER — Ambulatory Visit (INDEPENDENT_AMBULATORY_CARE_PROVIDER_SITE_OTHER): Payer: Medicare Other | Admitting: Sports Medicine

## 2013-07-14 ENCOUNTER — Encounter: Payer: Self-pay | Admitting: Sports Medicine

## 2013-07-14 VITALS — BP 127/81 | HR 74 | Ht 70.0 in | Wt 200.0 lb

## 2013-07-14 DIAGNOSIS — I959 Hypotension, unspecified: Secondary | ICD-10-CM

## 2013-07-14 DIAGNOSIS — I10 Essential (primary) hypertension: Secondary | ICD-10-CM | POA: Diagnosis not present

## 2013-07-14 DIAGNOSIS — H6190 Disorder of external ear, unspecified, unspecified ear: Secondary | ICD-10-CM | POA: Diagnosis not present

## 2013-07-14 DIAGNOSIS — N400 Enlarged prostate without lower urinary tract symptoms: Secondary | ICD-10-CM | POA: Diagnosis not present

## 2013-07-14 DIAGNOSIS — H6191 Disorder of right external ear, unspecified: Secondary | ICD-10-CM

## 2013-07-14 MED ORDER — FINASTERIDE 5 MG PO TABS: 5.0000 mg | ORAL_TABLET | Freq: Every day | ORAL | Status: DC

## 2013-07-14 NOTE — Assessment & Plan Note (Signed)
Doing extremely well on finasteride, we are going to refill the 5 mg tablets which is much cheaper for him.

## 2013-07-14 NOTE — Assessment & Plan Note (Signed)
Was due to dehydration, this has since resolved with avoiding his hydrochlorothiazide on days when he is going to work outside, and keeping himself well hydrated.

## 2013-07-14 NOTE — Assessment & Plan Note (Signed)
Resolved with cryotherapy.

## 2013-07-14 NOTE — Progress Notes (Signed)
  Subjective:    CC: Follow up  HPI: Hypertension: Well controlled, he did have a few episodes of hypotension and orthostasis last year, these resolved with discontinuing hydrochlorothiazide and prehydrating himself on days when he was going to be working outside in the heat.  BPH: Well controlled with finasteride, 5 mg tablets were much cheaper than 1 mg tablets, needs a refill.  Right ear lobe lesion : has since resolved with cryotherapy.  Past medical history, Surgical history, Family history not pertinant except as noted below, Social history, Allergies, and medications have been entered into the medical record, reviewed, and no changes needed.   Review of Systems: No fevers, chills, night sweats, weight loss, chest pain, or shortness of breath.   Objective:    General: Well Developed, well nourished, and in no acute distress.  Neuro: Alert and oriented x3, extra-ocular muscles intact, sensation grossly intact.  HEENT: Normocephalic, atraumatic, pupils equal round reactive to light, neck supple, no masses, no lymphadenopathy, thyroid nonpalpable.  Skin: Warm and dry, no rashes. Cardiac: Regular rate and rhythm, no murmurs rubs or gallops, no lower extremity edema.  Respiratory: Clear to auscultation bilaterally. Not using accessory muscles, speaking in full sentences.  Impression and Recommendations:

## 2013-07-14 NOTE — Assessment & Plan Note (Addendum)
Well-controlled, he did have some episodes of hypotension and presyncope, these were due to dehydration, he did have a negative outpatient rhythm monitoring with the cardiologist. He has done well in preventing these with pre-hydration and avoiding hydrochlorothiazide on days when he is going to be working outside in the sun. I have recommended salt-containing beverages such as Gatorade also on these days when he is working outside.

## 2013-08-13 ENCOUNTER — Other Ambulatory Visit: Payer: Self-pay | Admitting: Sports Medicine

## 2013-08-21 ENCOUNTER — Other Ambulatory Visit: Payer: Self-pay | Admitting: Sports Medicine

## 2013-08-21 DIAGNOSIS — J209 Acute bronchitis, unspecified: Secondary | ICD-10-CM | POA: Insufficient documentation

## 2013-08-21 MED ORDER — ALBUTEROL SULFATE HFA 108 (90 BASE) MCG/ACT IN AERS: 2.0000 | INHALATION_SPRAY | Freq: Four times a day (QID) | RESPIRATORY_TRACT | Status: DC | PRN

## 2013-08-21 MED ORDER — AZITHROMYCIN 250 MG PO TABS: ORAL_TABLET | ORAL | Status: DC

## 2013-08-21 MED ORDER — PREDNISONE 50 MG PO TABS: 50.0000 mg | ORAL_TABLET | Freq: Every day | ORAL | Status: DC

## 2013-08-21 NOTE — Assessment & Plan Note (Signed)
Increasing cough, wheeze, sob after working outside with pollen, dust.  Multilobe exp wheezes.  No exertional cp, nausea, diaphoresis, presyncope, palpitations.  rxing predisone burst, albuterol, azithromycin.  No lower ext swelling, no pleuritic cp.

## 2013-08-24 DIAGNOSIS — Z85828 Personal history of other malignant neoplasm of skin: Secondary | ICD-10-CM | POA: Diagnosis not present

## 2013-08-24 DIAGNOSIS — L578 Other skin changes due to chronic exposure to nonionizing radiation: Secondary | ICD-10-CM | POA: Diagnosis not present

## 2013-08-24 DIAGNOSIS — L570 Actinic keratosis: Secondary | ICD-10-CM | POA: Diagnosis not present

## 2013-08-24 DIAGNOSIS — L820 Inflamed seborrheic keratosis: Secondary | ICD-10-CM | POA: Diagnosis not present

## 2013-08-28 ENCOUNTER — Other Ambulatory Visit: Payer: Self-pay | Admitting: Sports Medicine

## 2013-08-30 ENCOUNTER — Encounter: Payer: Self-pay | Admitting: Sports Medicine

## 2013-08-30 ENCOUNTER — Ambulatory Visit (INDEPENDENT_AMBULATORY_CARE_PROVIDER_SITE_OTHER): Payer: Medicare Other | Admitting: Sports Medicine

## 2013-08-30 ENCOUNTER — Other Ambulatory Visit: Payer: Self-pay | Admitting: Sports Medicine

## 2013-08-30 VITALS — BP 164/96 | HR 71 | Ht 70.0 in | Wt 203.0 lb

## 2013-08-30 DIAGNOSIS — J209 Acute bronchitis, unspecified: Secondary | ICD-10-CM

## 2013-08-30 DIAGNOSIS — I10 Essential (primary) hypertension: Secondary | ICD-10-CM | POA: Diagnosis not present

## 2013-08-30 MED ORDER — PREDNISONE 50 MG PO TABS: 50.0000 mg | ORAL_TABLET | Freq: Every day | ORAL | Status: DC

## 2013-08-30 NOTE — Assessment & Plan Note (Signed)
Blood pressure is elevated today, he tells that he stopped his blood pressure medication recently due to some episodes of hypotension, and has been liberalizing his salt. He is going to get back onto his blood pressure medication.

## 2013-08-30 NOTE — Assessment & Plan Note (Signed)
Completely resolved with prednisone and bronchodilators. I will call in an extra course of prednisone that he can use if this recurs.

## 2013-08-30 NOTE — Progress Notes (Signed)
  Subjective:    CC: Followup  HPI: Acute bronchitis: Marcus Orr had a severe cough after working in the yard and being exposed to lots of dust a week ago. I saw him at a family function, and he was wheezing significantly with pleuritic pain. I immediately called him in some prednisone and azithromycin, as well as albuterol. He improved within an hour. He returns today with symptoms completely resolved.  Past medical history, Surgical history, Family history not pertinant except as noted below, Social history, Allergies, and medications have been entered into the medical record, reviewed, and no changes needed.   Review of Systems: No fevers, chills, night sweats, weight loss, chest pain, or shortness of breath.   Objective:    General: Well Developed, well nourished, and in no acute distress.  Neuro: Alert and oriented x3, extra-ocular muscles intact, sensation grossly intact.  HEENT: Normocephalic, atraumatic, pupils equal round reactive to light, neck supple, no masses, no lymphadenopathy, thyroid nonpalpable.  Skin: Warm and dry, no rashes. Cardiac: Regular rate and rhythm, no murmurs rubs or gallops, no lower extremity edema.  Respiratory: Clear to auscultation bilaterally. Not using accessory muscles, speaking in full sentences.  Impression and Recommendations:

## 2013-09-05 ENCOUNTER — Other Ambulatory Visit: Payer: Self-pay | Admitting: Sports Medicine

## 2013-10-11 DIAGNOSIS — I379 Nonrheumatic pulmonary valve disorder, unspecified: Secondary | ICD-10-CM | POA: Diagnosis not present

## 2013-10-11 DIAGNOSIS — Z8673 Personal history of transient ischemic attack (TIA), and cerebral infarction without residual deficits: Secondary | ICD-10-CM | POA: Diagnosis not present

## 2013-10-11 DIAGNOSIS — E785 Hyperlipidemia, unspecified: Secondary | ICD-10-CM | POA: Diagnosis not present

## 2013-10-11 DIAGNOSIS — I10 Essential (primary) hypertension: Secondary | ICD-10-CM | POA: Diagnosis not present

## 2013-10-19 DIAGNOSIS — H01009 Unspecified blepharitis unspecified eye, unspecified eyelid: Secondary | ICD-10-CM | POA: Diagnosis not present

## 2013-10-19 DIAGNOSIS — H0289 Other specified disorders of eyelid: Secondary | ICD-10-CM | POA: Diagnosis not present

## 2013-10-19 DIAGNOSIS — H02839 Dermatochalasis of unspecified eye, unspecified eyelid: Secondary | ICD-10-CM | POA: Diagnosis not present

## 2013-10-19 DIAGNOSIS — H2640 Unspecified secondary cataract: Secondary | ICD-10-CM | POA: Diagnosis not present

## 2013-10-19 DIAGNOSIS — Z961 Presence of intraocular lens: Secondary | ICD-10-CM | POA: Diagnosis not present

## 2013-10-19 DIAGNOSIS — Z9849 Cataract extraction status, unspecified eye: Secondary | ICD-10-CM | POA: Diagnosis not present

## 2013-10-19 DIAGNOSIS — H409 Unspecified glaucoma: Secondary | ICD-10-CM | POA: Diagnosis not present

## 2013-10-19 DIAGNOSIS — H4011X0 Primary open-angle glaucoma, stage unspecified: Secondary | ICD-10-CM | POA: Diagnosis not present

## 2013-12-02 ENCOUNTER — Other Ambulatory Visit: Payer: Self-pay | Admitting: Sports Medicine

## 2013-12-13 ENCOUNTER — Telehealth: Payer: Self-pay | Admitting: Sports Medicine

## 2013-12-13 MED ORDER — ACETAZOLAMIDE 125 MG PO TABS: ORAL_TABLET | ORAL | Status: DC

## 2013-12-13 NOTE — Telephone Encounter (Signed)
Marcus Orr is taking his mother to [place], he is susceptible to altitude sickness, we are going to give him the preventative dose of acetazolamide

## 2013-12-27 DIAGNOSIS — L820 Inflamed seborrheic keratosis: Secondary | ICD-10-CM | POA: Diagnosis not present

## 2013-12-27 DIAGNOSIS — Z85828 Personal history of other malignant neoplasm of skin: Secondary | ICD-10-CM | POA: Diagnosis not present

## 2013-12-27 DIAGNOSIS — L578 Other skin changes due to chronic exposure to nonionizing radiation: Secondary | ICD-10-CM | POA: Diagnosis not present

## 2013-12-27 DIAGNOSIS — L821 Other seborrheic keratosis: Secondary | ICD-10-CM | POA: Diagnosis not present

## 2014-01-05 ENCOUNTER — Other Ambulatory Visit: Payer: Self-pay | Admitting: *Deleted

## 2014-01-05 DIAGNOSIS — J209 Acute bronchitis, unspecified: Secondary | ICD-10-CM

## 2014-01-05 MED ORDER — ALBUTEROL SULFATE HFA 108 (90 BASE) MCG/ACT IN AERS: 2.0000 | INHALATION_SPRAY | Freq: Four times a day (QID) | RESPIRATORY_TRACT | Status: DC | PRN

## 2014-02-09 DIAGNOSIS — C44622 Squamous cell carcinoma of skin of right upper limb, including shoulder: Secondary | ICD-10-CM | POA: Diagnosis not present

## 2014-02-09 DIAGNOSIS — C44722 Squamous cell carcinoma of skin of right lower limb, including hip: Secondary | ICD-10-CM | POA: Diagnosis not present

## 2014-02-09 DIAGNOSIS — L570 Actinic keratosis: Secondary | ICD-10-CM | POA: Diagnosis not present

## 2014-02-09 DIAGNOSIS — Z85828 Personal history of other malignant neoplasm of skin: Secondary | ICD-10-CM | POA: Diagnosis not present

## 2014-02-09 DIAGNOSIS — D045 Carcinoma in situ of skin of trunk: Secondary | ICD-10-CM | POA: Diagnosis not present

## 2014-02-09 DIAGNOSIS — L820 Inflamed seborrheic keratosis: Secondary | ICD-10-CM | POA: Diagnosis not present

## 2014-02-09 DIAGNOSIS — D485 Neoplasm of uncertain behavior of skin: Secondary | ICD-10-CM | POA: Diagnosis not present

## 2014-02-09 DIAGNOSIS — L579 Skin changes due to chronic exposure to nonionizing radiation, unspecified: Secondary | ICD-10-CM | POA: Diagnosis not present

## 2014-02-09 DIAGNOSIS — D492 Neoplasm of unspecified behavior of bone, soft tissue, and skin: Secondary | ICD-10-CM | POA: Diagnosis not present

## 2014-02-10 DIAGNOSIS — Z23 Encounter for immunization: Secondary | ICD-10-CM | POA: Diagnosis not present

## 2014-03-02 DIAGNOSIS — D045 Carcinoma in situ of skin of trunk: Secondary | ICD-10-CM | POA: Diagnosis not present

## 2014-03-18 ENCOUNTER — Other Ambulatory Visit: Payer: Self-pay | Admitting: Sports Medicine

## 2014-04-19 DIAGNOSIS — C44722 Squamous cell carcinoma of skin of right lower limb, including hip: Secondary | ICD-10-CM | POA: Diagnosis not present

## 2014-04-19 DIAGNOSIS — L905 Scar conditions and fibrosis of skin: Secondary | ICD-10-CM | POA: Diagnosis not present

## 2014-04-25 DIAGNOSIS — H02832 Dermatochalasis of right lower eyelid: Secondary | ICD-10-CM | POA: Diagnosis not present

## 2014-04-25 DIAGNOSIS — H26493 Other secondary cataract, bilateral: Secondary | ICD-10-CM | POA: Diagnosis not present

## 2014-04-25 DIAGNOSIS — H02831 Dermatochalasis of right upper eyelid: Secondary | ICD-10-CM | POA: Diagnosis not present

## 2014-04-25 DIAGNOSIS — H4011X2 Primary open-angle glaucoma, moderate stage: Secondary | ICD-10-CM | POA: Diagnosis not present

## 2014-04-25 DIAGNOSIS — H02835 Dermatochalasis of left lower eyelid: Secondary | ICD-10-CM | POA: Diagnosis not present

## 2014-04-25 DIAGNOSIS — H01009 Unspecified blepharitis unspecified eye, unspecified eyelid: Secondary | ICD-10-CM | POA: Diagnosis not present

## 2014-04-25 DIAGNOSIS — H0289 Other specified disorders of eyelid: Secondary | ICD-10-CM | POA: Diagnosis not present

## 2014-04-25 DIAGNOSIS — H02834 Dermatochalasis of left upper eyelid: Secondary | ICD-10-CM | POA: Diagnosis not present

## 2014-04-25 DIAGNOSIS — Z961 Presence of intraocular lens: Secondary | ICD-10-CM | POA: Diagnosis not present

## 2014-04-29 ENCOUNTER — Other Ambulatory Visit: Payer: Self-pay | Admitting: Sports Medicine

## 2014-05-02 DIAGNOSIS — L570 Actinic keratosis: Secondary | ICD-10-CM | POA: Diagnosis not present

## 2014-05-02 DIAGNOSIS — L820 Inflamed seborrheic keratosis: Secondary | ICD-10-CM | POA: Diagnosis not present

## 2014-05-03 DIAGNOSIS — C44622 Squamous cell carcinoma of skin of right upper limb, including shoulder: Secondary | ICD-10-CM | POA: Diagnosis not present

## 2014-05-03 DIAGNOSIS — L905 Scar conditions and fibrosis of skin: Secondary | ICD-10-CM | POA: Diagnosis not present

## 2014-08-03 ENCOUNTER — Other Ambulatory Visit: Payer: Self-pay | Admitting: Sports Medicine

## 2014-08-31 DIAGNOSIS — L570 Actinic keratosis: Secondary | ICD-10-CM | POA: Diagnosis not present

## 2014-08-31 DIAGNOSIS — L579 Skin changes due to chronic exposure to nonionizing radiation, unspecified: Secondary | ICD-10-CM | POA: Diagnosis not present

## 2014-08-31 DIAGNOSIS — L303 Infective dermatitis: Secondary | ICD-10-CM | POA: Diagnosis not present

## 2014-08-31 DIAGNOSIS — Z85828 Personal history of other malignant neoplasm of skin: Secondary | ICD-10-CM | POA: Diagnosis not present

## 2014-08-31 DIAGNOSIS — L820 Inflamed seborrheic keratosis: Secondary | ICD-10-CM | POA: Diagnosis not present

## 2014-08-31 DIAGNOSIS — L814 Other melanin hyperpigmentation: Secondary | ICD-10-CM | POA: Diagnosis not present

## 2014-08-31 DIAGNOSIS — L821 Other seborrheic keratosis: Secondary | ICD-10-CM | POA: Diagnosis not present

## 2014-08-31 DIAGNOSIS — B372 Candidiasis of skin and nail: Secondary | ICD-10-CM | POA: Diagnosis not present

## 2014-10-05 DIAGNOSIS — L299 Pruritus, unspecified: Secondary | ICD-10-CM | POA: Diagnosis not present

## 2014-10-05 DIAGNOSIS — L2082 Flexural eczema: Secondary | ICD-10-CM | POA: Diagnosis not present

## 2014-10-09 DIAGNOSIS — E785 Hyperlipidemia, unspecified: Secondary | ICD-10-CM | POA: Diagnosis not present

## 2014-10-09 DIAGNOSIS — I371 Nonrheumatic pulmonary valve insufficiency: Secondary | ICD-10-CM | POA: Diagnosis not present

## 2014-10-09 DIAGNOSIS — I10 Essential (primary) hypertension: Secondary | ICD-10-CM | POA: Diagnosis not present

## 2014-10-20 ENCOUNTER — Other Ambulatory Visit: Payer: Self-pay | Admitting: Sports Medicine

## 2014-10-26 ENCOUNTER — Other Ambulatory Visit: Payer: Self-pay | Admitting: Sports Medicine

## 2014-10-26 DIAGNOSIS — H01001 Unspecified blepharitis right upper eyelid: Secondary | ICD-10-CM | POA: Diagnosis not present

## 2014-10-26 DIAGNOSIS — H02839 Dermatochalasis of unspecified eye, unspecified eyelid: Secondary | ICD-10-CM | POA: Diagnosis not present

## 2014-10-26 DIAGNOSIS — H02831 Dermatochalasis of right upper eyelid: Secondary | ICD-10-CM | POA: Diagnosis not present

## 2014-10-26 DIAGNOSIS — H01009 Unspecified blepharitis unspecified eye, unspecified eyelid: Secondary | ICD-10-CM | POA: Diagnosis not present

## 2014-10-26 DIAGNOSIS — Z961 Presence of intraocular lens: Secondary | ICD-10-CM | POA: Diagnosis not present

## 2014-10-26 DIAGNOSIS — H4011X2 Primary open-angle glaucoma, moderate stage: Secondary | ICD-10-CM | POA: Diagnosis not present

## 2014-11-09 DIAGNOSIS — L303 Infective dermatitis: Secondary | ICD-10-CM | POA: Diagnosis not present

## 2014-11-09 DIAGNOSIS — L820 Inflamed seborrheic keratosis: Secondary | ICD-10-CM | POA: Diagnosis not present

## 2014-11-09 DIAGNOSIS — L2082 Flexural eczema: Secondary | ICD-10-CM | POA: Diagnosis not present

## 2014-11-09 DIAGNOSIS — L570 Actinic keratosis: Secondary | ICD-10-CM | POA: Diagnosis not present

## 2014-11-09 DIAGNOSIS — B079 Viral wart, unspecified: Secondary | ICD-10-CM | POA: Diagnosis not present

## 2014-12-13 DIAGNOSIS — L570 Actinic keratosis: Secondary | ICD-10-CM | POA: Diagnosis not present

## 2014-12-13 DIAGNOSIS — L820 Inflamed seborrheic keratosis: Secondary | ICD-10-CM | POA: Diagnosis not present

## 2015-01-04 ENCOUNTER — Encounter: Payer: Self-pay | Admitting: Sports Medicine

## 2015-01-04 ENCOUNTER — Ambulatory Visit (INDEPENDENT_AMBULATORY_CARE_PROVIDER_SITE_OTHER): Payer: Medicare Other | Admitting: Sports Medicine

## 2015-01-04 VITALS — BP 143/79 | HR 70 | Ht 70.0 in | Wt 193.0 lb

## 2015-01-04 DIAGNOSIS — E785 Hyperlipidemia, unspecified: Secondary | ICD-10-CM

## 2015-01-04 DIAGNOSIS — Z23 Encounter for immunization: Secondary | ICD-10-CM | POA: Diagnosis not present

## 2015-01-04 DIAGNOSIS — H6122 Impacted cerumen, left ear: Secondary | ICD-10-CM | POA: Diagnosis not present

## 2015-01-04 DIAGNOSIS — I10 Essential (primary) hypertension: Secondary | ICD-10-CM

## 2015-01-04 DIAGNOSIS — Z0000 Encounter for general adult medical examination without abnormal findings: Secondary | ICD-10-CM

## 2015-01-04 DIAGNOSIS — E039 Hypothyroidism, unspecified: Secondary | ICD-10-CM

## 2015-01-04 NOTE — Assessment & Plan Note (Signed)
Rechecking TSH 

## 2015-01-04 NOTE — Progress Notes (Signed)
Subjective:    Marcus Vantol Sr. is a 81 y.o. male who presents for Medicare Annual/Subsequent preventive examination.   Preventive Screening-Counseling & Management  Tobacco History  Smoking status  . Never Smoker   Smokeless tobacco  . Not on file    Problems Prior to Visit 1. Difficulty hearing from left ear  Current Problems (verified) Patient Active Problem List   Diagnosis Date Noted  . Acute bronchitis 08/21/2013  . Actinic keratosis 01/13/2013  . Lesion of right earlobe 12/08/2012  . Renal insufficiency 07/14/2012  . BPH (benign prostatic hyperplasia) 06/16/2012  . Essential hypertension, benign 06/16/2012  . Hyperlipidemia LDL goal <100 06/16/2012  . Hypothyroidism 06/16/2012  . Glaucoma 06/16/2012  . Laryngopharyngeal reflux 06/16/2012  . Perennial allergic rhinitis 06/16/2012  . Preventive measure 06/16/2012    Medications Prior to Visit Current Outpatient Prescriptions on File Prior to Visit  Medication Sig Dispense Refill  . acetaZOLAMIDE (DIAMOX) 125 MG tablet One tab by mouth twice a day starting 24 hours before ascent and stopping 2 - 3 days after arrival. 60 tablet 0  . albuterol (PROVENTIL HFA;VENTOLIN HFA) 108 (90 BASE) MCG/ACT inhaler Inhale 2 puffs into the lungs every 6 (six) hours as needed for wheezing. 2 Inhaler 11  . amLODipine (NORVASC) 10 MG tablet TAKE ONE-HALF (1/2) TABLET (5 MG) DAILY 30 tablet 10  . ASPIRIN LOW DOSE 81 MG EC tablet TAKE 1 TABLET DAILY 90 tablet 0  . dorzolamide-timolol (COSOPT) 22.3-6.8 MG/ML ophthalmic solution Place 1 drop into both eyes 2 (two) times daily. 10 mL 11  . finasteride (PROSCAR) 5 MG tablet Take 1 tablet (5 mg total) by mouth daily. 90 tablet 3  . fluticasone (FLONASE) 50 MCG/ACT nasal spray Place 2 sprays into the nose daily. 16 g 3  . hydrochlorothiazide (HYDRODIURIL) 25 MG tablet TAKE 1 TABLET DAILY 90 tablet 0  . levothyroxine (SYNTHROID, LEVOTHROID) 75 MCG tablet TAKE 1 TABLET DAILY 90 tablet 1  .  lisinopril (PRINIVIL,ZESTRIL) 40 MG tablet TAKE 1/2 TABLET (20MG )  DAILY 90 tablet 2  . omeprazole (PRILOSEC) 40 MG capsule TAKE 1 CAPSULE DAILY AT DINNERTIME 90 capsule 0  . simvastatin (ZOCOR) 40 MG tablet TAKE 1 TABLET EVERY EVENING 900 tablet 0  . terazosin (HYTRIN) 5 MG capsule TAKE 1 CAPSULE AT BEDTIME 90 capsule 0  . Vitamin D, Ergocalciferol, (DRISDOL) 50000 UNITS CAPS      No current facility-administered medications on file prior to visit.    Current Medications (verified) Current Outpatient Prescriptions  Medication Sig Dispense Refill  . acetaZOLAMIDE (DIAMOX) 125 MG tablet One tab by mouth twice a day starting 24 hours before ascent and stopping 2 - 3 days after arrival. 60 tablet 0  . albuterol (PROVENTIL HFA;VENTOLIN HFA) 108 (90 BASE) MCG/ACT inhaler Inhale 2 puffs into the lungs every 6 (six) hours as needed for wheezing. 2 Inhaler 11  . amLODipine (NORVASC) 10 MG tablet TAKE ONE-HALF (1/2) TABLET (5 MG) DAILY 30 tablet 10  . ASPIRIN LOW DOSE 81 MG EC tablet TAKE 1 TABLET DAILY 90 tablet 0  . dorzolamide-timolol (COSOPT) 22.3-6.8 MG/ML ophthalmic solution Place 1 drop into both eyes 2 (two) times daily. 10 mL 11  . finasteride (PROSCAR) 5 MG tablet Take 1 tablet (5 mg total) by mouth daily. 90 tablet 3  . fluticasone (FLONASE) 50 MCG/ACT nasal spray Place 2 sprays into the nose daily. 16 g 3  . hydrochlorothiazide (HYDRODIURIL) 25 MG tablet TAKE 1 TABLET DAILY 90 tablet 0  . levothyroxine (  SYNTHROID, LEVOTHROID) 75 MCG tablet TAKE 1 TABLET DAILY 90 tablet 1  . lisinopril (PRINIVIL,ZESTRIL) 40 MG tablet TAKE 1/2 TABLET (20MG )  DAILY 90 tablet 2  . omeprazole (PRILOSEC) 40 MG capsule TAKE 1 CAPSULE DAILY AT DINNERTIME 90 capsule 0  . simvastatin (ZOCOR) 40 MG tablet TAKE 1 TABLET EVERY EVENING 900 tablet 0  . terazosin (HYTRIN) 5 MG capsule TAKE 1 CAPSULE AT BEDTIME 90 capsule 0  . Vitamin D, Ergocalciferol, (DRISDOL) 50000 UNITS CAPS      No current facility-administered  medications for this visit.     Allergies (verified) Review of patient's allergies indicates no known allergies.   PAST HISTORY  Family History History reviewed. No pertinent family history.  Social History Social History  Substance Use Topics  . Smoking status: Never Smoker   . Smokeless tobacco: Not on file  . Alcohol Use: No    Are there smokers in your home (other than you)?  No  Risk Factors Current exercise habits: Home exercise routine includes walking 2 hrs per day.  Dietary issues discussed: Not indicated   Cardiac risk factors: advanced age (older than 53 for men, 21 for women), dyslipidemia and hypertension.  Depression Screen (Note: if answer to either of the following is "Yes", a more complete depression screening is indicated)   Q1: Over the past two weeks, have you felt down, depressed or hopeless? No  Q2: Over the past two weeks, have you felt little interest or pleasure in doing things? No  Have you lost interest or pleasure in daily life? No  Do you often feel hopeless? No  Do you cry easily over simple problems? No  Activities of Daily Living In your present state of health, do you have any difficulty performing the following activities?:  Driving? No Managing money?  No Feeding yourself? No Getting from bed to chair? No Climbing a flight of stairs? No Preparing food and eating?: No Bathing or showering? No Getting dressed: No Getting to the toilet? No Using the toilet:No Moving around from place to place: No In the past year have you fallen or had a near fall?:No   Are you sexually active?  No  Do you have more than one partner?  No  Hearing Difficulties: No Do you often ask people to speak up or repeat themselves? No Do you experience ringing or noises in your ears? No Do you have difficulty understanding soft or whispered voices? No   Do you feel that you have a problem with memory? No  Do you often misplace items? No  Do you feel safe  at home?  Yes  Cognitive Testing  Alert? Yes  Normal Appearance?Yes  Oriented to person? Yes  Place? Yes   Time? Yes  Recall of three objects?  Yes  Can perform simple calculations? Yes  Displays appropriate judgment?Yes  Can read the correct time from a watch face?Yes   Advanced Directives have been discussed with the patient? Yes   List the Names of Other Physician/Practitioners you currently use: 1.    Indicate any recent Medical Services you may have received from other than [facility] providers in the past year (date may be approximate).  Immunization History  Administered Date(s) Administered  . Influenza Whole 04/15/2011  . Influenza,inj,Quad PF,36+ Mos 01/13/2013    Screening Tests Health Maintenance  Topic Date Due  . PNA vac Low Risk Adult (1 of 2 - PCV13) 10/18/2000  . INFLUENZA VACCINE  11/13/2014  . COLONOSCOPY  04/14/2017  .  TETANUS/TDAP  04/14/2018  . ZOSTAVAX  Addressed    All answers were reviewed with the patient and necessary referrals were made:  Aundria Mems, MD   01/04/2015   History reviewed: allergies, current medications, past family history, past medical history, past social history, past surgical history and problem list  Review of Systems A comprehensive review of systems was negative.    Objective:   Blood pressure 143/79, pulse 70, height 5\' 10"  (1.778 m), weight 193 lb (87.544 kg). Body mass index is 27.69 kg/(m^2).  General: Well Developed, well nourished, and in no acute distress.  Neuro: Alert and oriented x3, extra-ocular muscles intact, sensation grossly intact. Cranial nerves II through XII are intact, motor, sensory, and coordinative functions are all intact. HEENT: Normocephalic, atraumatic, pupils equal round reactive to light, neck supple, no masses, no lymphadenopathy, thyroid nonpalpable. Oropharynx, nasopharynx, external ear canals are unremarkable.cerumen impaction in the left ear canal Skin: Warm and dry, no rashes  noted.  Cardiac: Regular rate and rhythm, no murmurs rubs or gallops.  Respiratory: Clear to auscultation bilaterally. Not using accessory muscles, speaking in full sentences.  Abdominal: Soft, nontender, nondistended, positive bowel sounds, no masses, no organomegaly.  Musculoskeletal: Shoulder, elbow, wrist, hip, knee, ankle stable, and with full range of motion.  Indication: Cerumen impaction of the ear(s) Medical necessity statement: On physical examination, cerumen impairs clinically significant portions of the external auditory canal, and tympanic membrane. Noted obstructive, copious cerumen that cannot be removed without magnification and instrumentations requiring physician skills Consent: Discussed benefits and risks of procedure and verbal consent obtained Procedure: Patient was prepped for the procedure. Utilized an otoscope to assess and take note of the ear canal, the tympanic membrane, and the presence, amount, and placement of the cerumen. Gentle water irrigation and soft plastic curette was utilized to remove cerumen.  Post procedure examination: shows cerumen was completely removed. Patient tolerated procedure well. The patient is made aware that they may experience temporary vertigo, temporary hearing loss, and temporary discomfort. If these symptom last for more than 24 hours to call the clinic or proceed to the ED.   Assessment:     Healthy male      Plan:     During the course of the visit the patient was educated and counseled about appropriate screening and preventive services including:    Pneumococcal vaccine   Influenza vaccine  Diet review for nutrition referral? Yes ____  Not Indicated _x___   Patient Instructions (the written plan) was given to the patient.  Medicare Attestation I have personally reviewed: The patient's medical and social history Their use of alcohol, tobacco or illicit drugs Their current medications and supplements The patient's  functional ability including ADLs,fall risks, home safety risks, cognitive, and hearing and visual impairment Diet and physical activities Evidence for depression or mood disorders  The patient's weight, height, BMI, and visual acuity have been recorded in the chart.  I have made referrals, counseling, and provided education to the patient based on review of the above and I have provided the patient with a written personalized care plan for preventive services.     ___________________________________________ Gwen Her. Dianah Field, M.D., ABFM., CAQSM. Primary Care and [place] [employer] of [employer] [facility]

## 2015-01-04 NOTE — Assessment & Plan Note (Signed)
Continue simvastatin, rechecking lipids

## 2015-01-04 NOTE — Assessment & Plan Note (Signed)
Under moderate control on current medications, he did become presyncopal and hypotensive with any increase in his blood pressure medication. At his age his current numbers are adequate.

## 2015-01-08 DIAGNOSIS — E039 Hypothyroidism, unspecified: Secondary | ICD-10-CM | POA: Diagnosis not present

## 2015-01-08 DIAGNOSIS — E785 Hyperlipidemia, unspecified: Secondary | ICD-10-CM | POA: Diagnosis not present

## 2015-01-09 LAB — CBC
HCT: 38.7 % — ABNORMAL LOW (ref 39.0–52.0)
Hemoglobin: 12.8 g/dL — ABNORMAL LOW (ref 13.0–17.0)
MCH: 29.8 pg (ref 26.0–34.0)
MCHC: 33.1 g/dL (ref 30.0–36.0)
MCV: 90 fL (ref 78.0–100.0)
MPV: 9.3 fL (ref 8.6–12.4)
Platelets: 297 10*3/uL (ref 150–400)
RBC: 4.3 MIL/uL (ref 4.22–5.81)
RDW: 14.8 % (ref 11.5–15.5)
WBC: 5.7 10*3/uL (ref 4.0–10.5)

## 2015-01-09 LAB — COMPREHENSIVE METABOLIC PANEL WITH GFR
AST: 18 U/L (ref 10–35)
Albumin: 4 g/dL (ref 3.6–5.1)
Alkaline Phosphatase: 66 U/L (ref 40–115)
CO2: 27 mmol/L (ref 20–31)
Calcium: 9 mg/dL (ref 8.6–10.3)
Glucose, Bld: 87 mg/dL (ref 65–99)
Potassium: 4.3 mmol/L (ref 3.5–5.3)
Total Bilirubin: 0.5 mg/dL (ref 0.2–1.2)

## 2015-01-09 LAB — COMPREHENSIVE METABOLIC PANEL
ALT: 14 U/L (ref 9–46)
BUN: 17 mg/dL (ref 7–25)
Chloride: 105 mmol/L (ref 98–110)
Creat: 1.46 mg/dL — ABNORMAL HIGH (ref 0.70–1.18)
Sodium: 142 mmol/L (ref 135–146)
Total Protein: 6.7 g/dL (ref 6.1–8.1)

## 2015-01-09 LAB — LIPID PANEL
Cholesterol: 178 mg/dL (ref 125–200)
HDL: 36 mg/dL — ABNORMAL LOW (ref >=40)
LDL Cholesterol: 111 mg/dL (ref <130)
Total CHOL/HDL Ratio: 4.9 ratio (ref <=5.0)
Triglycerides: 157 mg/dL — ABNORMAL HIGH (ref <150)
VLDL: 31 mg/dL — ABNORMAL HIGH (ref <30)

## 2015-01-09 LAB — TSH: TSH: 3.24 u[IU]/mL (ref 0.350–4.500)

## 2015-01-10 DIAGNOSIS — L570 Actinic keratosis: Secondary | ICD-10-CM | POA: Diagnosis not present

## 2015-01-22 ENCOUNTER — Other Ambulatory Visit: Payer: Self-pay | Admitting: Sports Medicine

## 2015-03-06 ENCOUNTER — Other Ambulatory Visit: Payer: Self-pay | Admitting: Sports Medicine

## 2015-03-06 ENCOUNTER — Ambulatory Visit (INDEPENDENT_AMBULATORY_CARE_PROVIDER_SITE_OTHER): Payer: Medicare Other

## 2015-03-06 DIAGNOSIS — Z96653 Presence of artificial knee joint, bilateral: Secondary | ICD-10-CM | POA: Insufficient documentation

## 2015-03-06 DIAGNOSIS — Z96652 Presence of left artificial knee joint: Secondary | ICD-10-CM | POA: Diagnosis not present

## 2015-03-06 DIAGNOSIS — Z96651 Presence of right artificial knee joint: Secondary | ICD-10-CM | POA: Diagnosis not present

## 2015-03-06 DIAGNOSIS — Z471 Aftercare following joint replacement surgery: Secondary | ICD-10-CM | POA: Diagnosis not present

## 2015-03-06 NOTE — Assessment & Plan Note (Signed)
Rechecking x-rays, bilateral arthroplasty was done in [place], patient is doing extremely well, asymptomatic, and simply need surveillance x-rays.

## 2015-04-03 DIAGNOSIS — D492 Neoplasm of unspecified behavior of bone, soft tissue, and skin: Secondary | ICD-10-CM | POA: Diagnosis not present

## 2015-04-03 DIAGNOSIS — L570 Actinic keratosis: Secondary | ICD-10-CM | POA: Diagnosis not present

## 2015-04-03 DIAGNOSIS — L579 Skin changes due to chronic exposure to nonionizing radiation, unspecified: Secondary | ICD-10-CM | POA: Diagnosis not present

## 2015-04-03 DIAGNOSIS — Z85828 Personal history of other malignant neoplasm of skin: Secondary | ICD-10-CM | POA: Diagnosis not present

## 2015-04-03 DIAGNOSIS — L820 Inflamed seborrheic keratosis: Secondary | ICD-10-CM | POA: Diagnosis not present

## 2015-04-03 DIAGNOSIS — D0471 Carcinoma in situ of skin of right lower limb, including hip: Secondary | ICD-10-CM | POA: Diagnosis not present

## 2015-04-20 ENCOUNTER — Other Ambulatory Visit: Payer: Self-pay | Admitting: Sports Medicine

## 2015-04-26 ENCOUNTER — Other Ambulatory Visit: Payer: Self-pay

## 2015-04-26 DIAGNOSIS — J209 Acute bronchitis, unspecified: Secondary | ICD-10-CM

## 2015-04-26 MED ORDER — ALBUTEROL SULFATE HFA 108 (90 BASE) MCG/ACT IN AERS: 2.0000 | INHALATION_SPRAY | Freq: Four times a day (QID) | RESPIRATORY_TRACT | Status: DC | PRN

## 2015-05-16 DIAGNOSIS — C44722 Squamous cell carcinoma of skin of right lower limb, including hip: Secondary | ICD-10-CM | POA: Diagnosis not present

## 2015-06-25 ENCOUNTER — Ambulatory Visit (INDEPENDENT_AMBULATORY_CARE_PROVIDER_SITE_OTHER): Payer: Medicare Other | Admitting: Sports Medicine

## 2015-06-25 ENCOUNTER — Encounter: Payer: Self-pay | Admitting: Sports Medicine

## 2015-06-25 ENCOUNTER — Ambulatory Visit (INDEPENDENT_AMBULATORY_CARE_PROVIDER_SITE_OTHER): Payer: Medicare Other

## 2015-06-25 VITALS — BP 142/87 | HR 79 | Resp 18 | Wt 199.2 lb

## 2015-06-25 DIAGNOSIS — R6889 Other general symptoms and signs: Secondary | ICD-10-CM | POA: Insufficient documentation

## 2015-06-25 DIAGNOSIS — R059 Cough, unspecified: Secondary | ICD-10-CM

## 2015-06-25 DIAGNOSIS — R05 Cough: Secondary | ICD-10-CM

## 2015-06-25 DIAGNOSIS — R0602 Shortness of breath: Secondary | ICD-10-CM | POA: Diagnosis not present

## 2015-06-25 DIAGNOSIS — R079 Chest pain, unspecified: Secondary | ICD-10-CM | POA: Diagnosis not present

## 2015-06-25 MED ORDER — PREDNISONE 50 MG PO TABS
50.0000 mg | ORAL_TABLET | Freq: Every day | ORAL | Status: DC
Start: 2015-06-25 — End: 2015-06-26

## 2015-06-25 MED ORDER — FLUTICASONE PROPIONATE 50 MCG/ACT NA SUSP: NASAL | Status: DC

## 2015-06-25 MED ORDER — AZITHROMYCIN 250 MG PO TABS: ORAL_TABLET | ORAL | Status: DC

## 2015-06-25 NOTE — Assessment & Plan Note (Addendum)
Azithromycin, prednisone, chest x-ray.  Flonase twice a day. Return if no better in 2 weeks  Pharmacy did not have 50 mg prednisone pills and stopped, they're requesting to do 5, 10 mg pills daily for 5 days.

## 2015-06-25 NOTE — Progress Notes (Signed)
  Subjective:    CC: Coughing  HPI: Marcus Orr just returned from a trip to [place], for the past several weeks he's had an increasing cough, shortness of breath, fatigue. Symptoms are moderate, persistent, no chest pain, no lower extremity swelling, no fevers, chills, night sweats, or weight loss.  Past medical history, Surgical history, Family history not pertinant except as noted below, Social history, Allergies, and medications have been entered into the medical record, reviewed, and no changes needed.   Review of Systems: No fevers, chills, night sweats, weight loss, chest pain, or shortness of breath.   Objective:    General: Well Developed, well nourished, and in no acute distress.  Neuro: Alert and oriented x3, extra-ocular muscles intact, sensation grossly intact.  HEENT: Normocephalic, atraumatic, pupils equal round reactive to light, neck supple, no masses, no lymphadenopathy, thyroid nonpalpable.  Oropharynx, nasopharynx, ear canals unremarkable Skin: Warm and dry, no rashes. Cardiac: Regular rate and rhythm, no murmurs rubs or gallops, no lower extremity edema.  Respiratory: Coarse breath sounds in the left base. Not using accessory muscles, speaking in full sentences.  X-ray personally reviewed, there are prominent vascular markings but no clear evidence of infiltrate  Impression and Recommendations:

## 2015-06-26 MED ORDER — PREDNISONE 10 MG PO TABS: 50.0000 mg | ORAL_TABLET | Freq: Every day | ORAL | Status: DC

## 2015-06-26 NOTE — Addendum Note (Signed)
Addended by: Silverio Decamp on: 06/26/2015 02:05 PM   Modules accepted: Orders

## 2015-07-09 ENCOUNTER — Ambulatory Visit (INDEPENDENT_AMBULATORY_CARE_PROVIDER_SITE_OTHER): Payer: Medicare Other | Admitting: Sports Medicine

## 2015-07-09 ENCOUNTER — Encounter: Payer: Self-pay | Admitting: Sports Medicine

## 2015-07-09 VITALS — BP 158/70 | HR 69 | Resp 18 | Wt 196.1 lb

## 2015-07-09 DIAGNOSIS — N400 Enlarged prostate without lower urinary tract symptoms: Secondary | ICD-10-CM | POA: Diagnosis not present

## 2015-07-09 DIAGNOSIS — R05 Cough: Secondary | ICD-10-CM

## 2015-07-09 DIAGNOSIS — R059 Cough, unspecified: Secondary | ICD-10-CM

## 2015-07-09 MED ORDER — FINASTERIDE 5 MG PO TABS: 5.0000 mg | ORAL_TABLET | Freq: Every day | ORAL | Status: DC

## 2015-07-09 MED ORDER — BUDESONIDE-FORMOTEROL FUMARATE 160-4.5 MCG/ACT IN AERO: 1.0000 | INHALATION_SPRAY | Freq: Two times a day (BID) | RESPIRATORY_TRACT | Status: DC

## 2015-07-09 MED ORDER — LORATADINE 10 MG PO TABS: 10.0000 mg | ORAL_TABLET | Freq: Every day | ORAL | Status: DC

## 2015-07-09 NOTE — Assessment & Plan Note (Signed)
Overall feels better and exam is benign. Still has some subjective shortness of breath. Spirometry was negative in 2014 and an echocardiogram was negative in 2012, stress test was negative approximately 2 years ago. Adding Symbicort for daily use. My suspicion is there is some element of allergy here.

## 2015-07-09 NOTE — Progress Notes (Signed)
  Subjective:    CC: Follow-up  HPI: Cough: Improved with steroids and antibiotics however still has some subjective shortness of breath with exertion, he had a negative echocardiogram in 2012, a normal stress test 2 years ago, and spirometry was negative in 2014. Does have some upper respiratory type stridor, also has significant rhinorrhea and other signs of allergy. Not taking any antihistamines. No chest pain.  Past medical history, Surgical history, Family history not pertinant except as noted below, Social history, Allergies, and medications have been entered into the medical record, reviewed, and no changes needed.   Review of Systems: No fevers, chills, night sweats, weight loss, chest pain, or shortness of breath.   Objective:    General: Well Developed, well nourished, and in no acute distress.  Neuro: Alert and oriented x3, extra-ocular muscles intact, sensation grossly intact.  HEENT: Normocephalic, atraumatic, pupils equal round reactive to light, neck supple, no masses, no lymphadenopathy, thyroid nonpalpable.  Skin: Warm and dry, no rashes. Cardiac: Regular rate and rhythm, no murmurs rubs or gallops, no lower extremity edema.  Respiratory: Clear to auscultation bilaterally. Not using accessory muscles, speaking in full sentences.  Impression and Recommendations:    I spent 25 minutes with this patient, greater than 50% was face-to-face time counseling regarding the above diagnoses

## 2015-07-12 ENCOUNTER — Other Ambulatory Visit: Payer: Self-pay | Admitting: *Deleted

## 2015-07-12 DIAGNOSIS — N400 Enlarged prostate without lower urinary tract symptoms: Secondary | ICD-10-CM

## 2015-07-12 MED ORDER — FINASTERIDE 5 MG PO TABS: 5.0000 mg | ORAL_TABLET | Freq: Every day | ORAL | Status: DC

## 2015-08-06 ENCOUNTER — Encounter: Payer: Self-pay | Admitting: Sports Medicine

## 2015-08-06 ENCOUNTER — Ambulatory Visit (INDEPENDENT_AMBULATORY_CARE_PROVIDER_SITE_OTHER): Payer: Medicare Other | Admitting: Sports Medicine

## 2015-08-06 VITALS — BP 164/78 | HR 71 | Resp 16 | Wt 199.5 lb

## 2015-08-06 DIAGNOSIS — R05 Cough: Secondary | ICD-10-CM | POA: Diagnosis not present

## 2015-08-06 DIAGNOSIS — R059 Cough, unspecified: Secondary | ICD-10-CM

## 2015-08-06 DIAGNOSIS — I10 Essential (primary) hypertension: Secondary | ICD-10-CM

## 2015-08-06 NOTE — Assessment & Plan Note (Signed)
Persistently elevated on recheck, he is only doing one half tab of amlodipine, he will increase to one half tab twice a day.

## 2015-08-06 NOTE — Progress Notes (Signed)
   Subjective:    CC: Follow-up  HPI: Hypertension: Slightly elevated, has self discontinued his lisinopril.  Shows of breath: Negative recent stress test, negative pre-and postbronchodilator spirometry, we did add a bit of Symbicort which has resolved all the symptoms.  Past medical history, Surgical history, Family history not pertinant except as noted below, Social history, Allergies, and medications have been entered into the medical record, reviewed, and no changes needed.   Review of Systems: No fevers, chills, night sweats, weight loss, chest pain, or shortness of breath.   Objective:    General: Well Developed, well nourished, and in no acute distress.  Neuro: Alert and oriented x3, extra-ocular muscles intact, sensation grossly intact.  HEENT: Normocephalic, atraumatic, pupils equal round reactive to light, neck supple, no masses, no lymphadenopathy, thyroid nonpalpable.  Skin: Warm and dry, no rashes. Cardiac: Regular rate and rhythm, no murmurs rubs or gallops, no lower extremity edema.  Respiratory: Clear to auscultation bilaterally. Not using accessory muscles, speaking in full sentences.  Impression and Recommendations:

## 2015-08-06 NOTE — Assessment & Plan Note (Signed)
Resolved with Symbicort

## 2015-08-10 DIAGNOSIS — H01009 Unspecified blepharitis unspecified eye, unspecified eyelid: Secondary | ICD-10-CM | POA: Diagnosis not present

## 2015-08-10 DIAGNOSIS — H401131 Primary open-angle glaucoma, bilateral, mild stage: Secondary | ICD-10-CM | POA: Diagnosis not present

## 2015-08-10 DIAGNOSIS — H401132 Primary open-angle glaucoma, bilateral, moderate stage: Secondary | ICD-10-CM | POA: Diagnosis not present

## 2015-08-10 DIAGNOSIS — H02839 Dermatochalasis of unspecified eye, unspecified eyelid: Secondary | ICD-10-CM | POA: Diagnosis not present

## 2015-08-10 DIAGNOSIS — H02836 Dermatochalasis of left eye, unspecified eyelid: Secondary | ICD-10-CM | POA: Diagnosis not present

## 2015-08-10 DIAGNOSIS — H02833 Dermatochalasis of right eye, unspecified eyelid: Secondary | ICD-10-CM | POA: Diagnosis not present

## 2015-08-10 DIAGNOSIS — Z79899 Other long term (current) drug therapy: Secondary | ICD-10-CM | POA: Diagnosis not present

## 2015-08-10 DIAGNOSIS — Z7982 Long term (current) use of aspirin: Secondary | ICD-10-CM | POA: Diagnosis not present

## 2015-08-10 DIAGNOSIS — Z961 Presence of intraocular lens: Secondary | ICD-10-CM | POA: Diagnosis not present

## 2015-08-14 DIAGNOSIS — L579 Skin changes due to chronic exposure to nonionizing radiation, unspecified: Secondary | ICD-10-CM | POA: Diagnosis not present

## 2015-08-14 DIAGNOSIS — L570 Actinic keratosis: Secondary | ICD-10-CM | POA: Diagnosis not present

## 2015-08-14 DIAGNOSIS — L820 Inflamed seborrheic keratosis: Secondary | ICD-10-CM | POA: Diagnosis not present

## 2015-08-14 DIAGNOSIS — Z85828 Personal history of other malignant neoplasm of skin: Secondary | ICD-10-CM | POA: Diagnosis not present

## 2015-09-12 ENCOUNTER — Other Ambulatory Visit: Payer: Self-pay

## 2015-09-12 MED ORDER — AMLODIPINE BESYLATE 10 MG PO TABS: 5.0000 mg | ORAL_TABLET | Freq: Two times a day (BID) | ORAL | Status: DC

## 2015-09-12 NOTE — Progress Notes (Signed)
Reordered Rx per Pt request. Directions changed per last OV with PCP.

## 2015-09-21 ENCOUNTER — Other Ambulatory Visit: Payer: Self-pay | Admitting: Sports Medicine

## 2015-09-21 DIAGNOSIS — R059 Cough, unspecified: Secondary | ICD-10-CM

## 2015-09-21 DIAGNOSIS — R05 Cough: Secondary | ICD-10-CM

## 2015-09-21 MED ORDER — FLUTICASONE PROPIONATE 50 MCG/ACT NA SUSP: NASAL | Status: DC

## 2015-09-30 ENCOUNTER — Telehealth: Payer: Self-pay | Admitting: Sports Medicine

## 2015-09-30 ENCOUNTER — Other Ambulatory Visit: Payer: Self-pay | Admitting: Sports Medicine

## 2015-09-30 DIAGNOSIS — D509 Iron deficiency anemia, unspecified: Secondary | ICD-10-CM

## 2015-09-30 DIAGNOSIS — R6889 Other general symptoms and signs: Secondary | ICD-10-CM | POA: Diagnosis not present

## 2015-09-30 DIAGNOSIS — R791 Abnormal coagulation profile: Secondary | ICD-10-CM | POA: Diagnosis not present

## 2015-09-30 DIAGNOSIS — D649 Anemia, unspecified: Secondary | ICD-10-CM | POA: Diagnosis not present

## 2015-09-30 NOTE — Telephone Encounter (Signed)
Spirometry negative in 2014, no symptoms of angina, negative treadmill stress test 3 years ago, ECHO in 2012 showed moderate pulmonic regurgitation and mild to moderate aortic stenosis, suspect simple deconditioning.  No chest pain, no heart failure symptoms.  Checking CBC, CMP, d-dimer, BNP.  Need updated ECHO.

## 2015-09-30 NOTE — Assessment & Plan Note (Addendum)
Spirometry negative in 2014, no symptoms of angina, negative treadmill stress test 3 years ago, ECHO in 2012 showed moderate pulmonic regurgitation and mild to moderate aortic stenosis, suspect simple deconditioning.  No chest pain, no heart failure symptoms.  Checking CBC, CMP, d-dimer, BNP.  Need updated ECHO.  Echo is scheduled, noted hemoglobin down to 6, adding additional blood work including an anemia panel, reticulocyte count, heavy metal screen, peripheral smear, blood type, he will need 2 units of packed red blood cells, we are setting this up at the [place] [facility] [place]. He will also need referral to gastroenterology, on further questioning does endorse some black and tarry stools. He also has an elevated d-dimer, setting him up for CT angiogram of the pulmonary arteries today.

## 2015-10-01 DIAGNOSIS — R6889 Other general symptoms and signs: Secondary | ICD-10-CM | POA: Diagnosis not present

## 2015-10-01 LAB — CBC
HCT: 21.2 % — ABNORMAL LOW (ref 38.5–50.0)
Hemoglobin: 6.4 g/dL — CL (ref 13.2–17.1)
MCH: 21.3 pg — ABNORMAL LOW (ref 27.0–33.0)
MCHC: 30.2 g/dL — ABNORMAL LOW (ref 32.0–36.0)
MCV: 70.4 fL — ABNORMAL LOW (ref 80.0–100.0)
MPV: 9 fL (ref 7.5–12.5)
Platelets: 439 10*3/uL — ABNORMAL HIGH (ref 140–400)
RBC: 3.01 MIL/uL — ABNORMAL LOW (ref 4.20–5.80)
RDW: 16 % — ABNORMAL HIGH (ref 11.0–15.0)
WBC: 7.3 K/uL (ref 3.8–10.8)

## 2015-10-01 LAB — COMPREHENSIVE METABOLIC PANEL
AST: 19 U/L (ref 10–35)
Albumin: 4 g/dL (ref 3.6–5.1)
CO2: 25 mmol/L (ref 20–31)
Calcium: 8.7 mg/dL (ref 8.6–10.3)
Chloride: 107 mmol/L (ref 98–110)
Creat: 1.48 mg/dL — ABNORMAL HIGH (ref 0.70–1.18)
Sodium: 140 mmol/L (ref 135–146)
Total Bilirubin: 0.3 mg/dL (ref 0.2–1.2)
Total Protein: 6.5 g/dL (ref 6.1–8.1)

## 2015-10-01 LAB — COMPREHENSIVE METABOLIC PANEL WITH GFR
ALT: 15 U/L (ref 9–46)
Alkaline Phosphatase: 58 U/L (ref 40–115)
BUN: 28 mg/dL — ABNORMAL HIGH (ref 7–25)
Glucose, Bld: 92 mg/dL (ref 65–99)
Potassium: 4.7 mmol/L (ref 3.5–5.3)

## 2015-10-02 ENCOUNTER — Encounter: Payer: Self-pay | Admitting: Sports Medicine

## 2015-10-02 ENCOUNTER — Encounter: Payer: Self-pay | Admitting: Hematology & Oncology

## 2015-10-02 ENCOUNTER — Ambulatory Visit (HOSPITAL_BASED_OUTPATIENT_CLINIC_OR_DEPARTMENT_OTHER)
Admission: RE | Admit: 2015-10-02 | Discharge: 2015-10-02 | Disposition: A | Discharge status: Home / Self Care | Payer: Medicare Other | Source: Ambulatory Visit | Attending: Sports Medicine | Admitting: Sports Medicine

## 2015-10-02 ENCOUNTER — Ambulatory Visit (HOSPITAL_BASED_OUTPATIENT_CLINIC_OR_DEPARTMENT_OTHER): Payer: Medicare Other

## 2015-10-02 ENCOUNTER — Encounter (HOSPITAL_BASED_OUTPATIENT_CLINIC_OR_DEPARTMENT_OTHER): Payer: Self-pay

## 2015-10-02 ENCOUNTER — Ambulatory Visit (HOSPITAL_BASED_OUTPATIENT_CLINIC_OR_DEPARTMENT_OTHER): Payer: Medicare Other | Admitting: Hematology & Oncology

## 2015-10-02 ENCOUNTER — Other Ambulatory Visit (HOSPITAL_BASED_OUTPATIENT_CLINIC_OR_DEPARTMENT_OTHER): Payer: Medicare Other

## 2015-10-02 VITALS — BP 152/68 | HR 57 | Resp 20

## 2015-10-02 VITALS — BP 156/67 | HR 75 | Temp 97.7°F | Wt 198.0 lb

## 2015-10-02 DIAGNOSIS — K769 Liver disease, unspecified: Secondary | ICD-10-CM | POA: Diagnosis not present

## 2015-10-02 DIAGNOSIS — K922 Gastrointestinal hemorrhage, unspecified: Secondary | ICD-10-CM

## 2015-10-02 DIAGNOSIS — D500 Iron deficiency anemia secondary to blood loss (chronic): Secondary | ICD-10-CM

## 2015-10-02 DIAGNOSIS — K921 Melena: Secondary | ICD-10-CM

## 2015-10-02 DIAGNOSIS — R918 Other nonspecific abnormal finding of lung field: Secondary | ICD-10-CM | POA: Insufficient documentation

## 2015-10-02 DIAGNOSIS — R6889 Other general symptoms and signs: Secondary | ICD-10-CM | POA: Insufficient documentation

## 2015-10-02 HISTORY — DX: Iron deficiency anemia secondary to blood loss (chronic): D50.0

## 2015-10-02 HISTORY — DX: Gastrointestinal hemorrhage, unspecified: K92.2

## 2015-10-02 LAB — BRAIN NATRIURETIC PEPTIDE: Brain Natriuretic Peptide: 43.9 pg/mL (ref <100)

## 2015-10-02 LAB — IRON AND TIBC
%SAT: 1 % — ABNORMAL LOW (ref 15–60)
Iron: <10 ug/dL — ABNORMAL LOW (ref 50–180)
TIBC: 338 ug/dL (ref 250–425)
UIBC: 333 ug/dL (ref 125–400)

## 2015-10-02 LAB — CMP (CANCER CENTER ONLY)
ALK PHOS: 59 U/L (ref 26–84)
ALT: 25 U/L (ref 10–47)
AST: 23 U/L (ref 11–38)
Albumin: 3.6 g/dL (ref 3.3–5.5)
BILIRUBIN TOTAL: 0.6 mg/dL (ref 0.20–1.60)
BUN: 27 mg/dL — AB (ref 7–22)
CALCIUM: 8.6 mg/dL (ref 8.0–10.3)
CO2: 27 mEq/L (ref 18–33)
Chloride: 102 mEq/L (ref 98–108)
Creat: 1.7 mg/dl — ABNORMAL HIGH (ref 0.6–1.2)
GLUCOSE: 93 mg/dL (ref 73–118)
Potassium: 4 mEq/L (ref 3.3–4.7)
Sodium: 133 mEq/L (ref 128–145)
TOTAL PROTEIN: 6.7 g/dL (ref 6.4–8.1)

## 2015-10-02 LAB — CBC WITH DIFFERENTIAL (CANCER CENTER ONLY)
BASO#: 0.1 10*3/uL (ref 0.0–0.2)
BASO%: 1.2 % (ref 0.0–2.0)
EOS%: 5.6 % (ref 0.0–7.0)
Eosinophils Absolute: 0.4 10*3/uL (ref 0.0–0.5)
HEMATOCRIT: 20.5 % — AB (ref 38.7–49.9)
HGB: 6.2 g/dL — CL (ref 13.0–17.1)
LYMPH#: 1.4 10*3/uL (ref 0.9–3.3)
LYMPH%: 19.9 % (ref 14.0–48.0)
MCH: 21.9 pg — ABNORMAL LOW (ref 28.0–33.4)
MCHC: 30.2 g/dL — ABNORMAL LOW (ref 32.0–35.9)
MCV: 72 fL — AB (ref 82–98)
MONO#: 0.7 10*3/uL (ref 0.1–0.9)
MONO%: 9.4 % (ref 0.0–13.0)
NEUT%: 63.9 % (ref 40.0–80.0)
NEUTROS ABS: 4.5 10*3/uL (ref 1.5–6.5)
PLATELETS: 326 10*3/uL (ref 145–400)
RBC: 2.83 10*6/uL — AB (ref 4.20–5.70)
RDW: 15.4 % (ref 11.1–15.7)
WBC: 7 10*3/uL (ref 4.0–10.0)

## 2015-10-02 LAB — CHCC SATELLITE - SMEAR

## 2015-10-02 LAB — RETICULOCYTES
ABS Retic: 30400 cells/uL (ref 25000–90000)
RBC.: 3.04 MIL/uL — ABNORMAL LOW (ref 4.20–5.80)
Retic Ct Pct: 1 %

## 2015-10-02 LAB — D-DIMER, QUANTITATIVE: D-Dimer, Quant: 0.96 mcg/mL FEU — ABNORMAL HIGH (ref <0.50)

## 2015-10-02 MED ORDER — IOPAMIDOL (ISOVUE-370) INJECTION 76%
100.0000 mL | Freq: Once | INTRAVENOUS | Status: AC | PRN
  Administered 2015-10-02: 80 mL via INTRAVENOUS

## 2015-10-02 MED ORDER — SODIUM CHLORIDE 0.9 % IV SOLN
Freq: Once | INTRAVENOUS | Status: AC
  Administered 2015-10-02: 14:00:00 via INTRAVENOUS

## 2015-10-02 MED ORDER — SODIUM CHLORIDE 0.9 % IV SOLN
510.0000 mg | Freq: Once | INTRAVENOUS | Status: AC
  Administered 2015-10-02: 510 mg via INTRAVENOUS
  Filled 2015-10-02: qty 17

## 2015-10-02 NOTE — Assessment & Plan Note (Signed)
Severe and symptomatically. Adding additional hematologic indices, referral to the [place] [facility] for 2 units of packed red blood cells, I'm also going to send him to GI.

## 2015-10-02 NOTE — Patient Instructions (Signed)

## 2015-10-02 NOTE — Addendum Note (Signed)
Addended by: Silverio Decamp on: 10/02/2015 10:02 AM   Modules accepted: Orders

## 2015-10-02 NOTE — Progress Notes (Signed)
Referral MD  Reason for Referral: Iron deficiency anemia secondary to GI bleeding   Chief Complaint  Patient presents with  . New Patient (Initial Visit)  : I feel short of breath and my blood count is low.  HPI: [name] is a very nice 82 year old white male. He has a very interesting job. He used to work for [employer] back in the 4s and 75s. 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 He really enjoyed this.  His grandson in law is Dr. Marland KitchenT" who is a internal medicine doctor and Marcus Orr. [name] has been having some shortness of breath issues. He subsequently saw Dr. Darene Orr and had a CT) of the chest done. This did not show pulmonary embolism. He has some non-distinct pulmonary nodules which were subcentimeter.  Blood work was then done. This showed a hemoglobin of 6.4. His white cell count and platelet count were okay.  He has not had any obvious melena. He has had no hematemesis. He has had no fever.  His MCV was quite low. It was 70. Back 6 months ago it was 90.  He is not chewing ice. He does feel some shortness of breath. He has had no cough. He has had no rashes. He has had no obvious change in bowel or bladder habits.  He is not a vegetarian. He has not had any abdominal surgery.  There has not been any change in medications. He is not on any blood thinner.  He does not smoke. He really does not drink.  There is no history of blood disorders in the family.  Overall, his performance status is ECOG 0.   Past Medical History  Diagnosis Date  . Hypertension   . Hyperlipidemia   . Thyroid disease   . Iron deficiency anemia due to chronic blood loss 10/02/2015  . Lower GI bleed 10/02/2015  :  Past Surgical History  Procedure Laterality Date  . Replacement total knee    :   Current outpatient prescriptions:  .  albuterol (PROVENTIL HFA;VENTOLIN HFA) 108 (90 Base) MCG/ACT inhaler, Inhale 2 puffs into the lungs every 6 (six) hours as needed for wheezing., Disp: 2 Inhaler, Rfl:  11 .  amLODipine (NORVASC) 10 MG tablet, Take 0.5 tablets (5 mg total) by mouth 2 (two) times daily., Disp: 180 tablet, Rfl: 1 .  ASPIRIN LOW DOSE 81 MG EC tablet, TAKE 1 TABLET DAILY, Disp: 90 tablet, Rfl: 3 .  budesonide-formoterol (SYMBICORT) 160-4.5 MCG/ACT inhaler, Inhale 1 puff into the lungs 2 (two) times daily., Disp: 1 Inhaler, Rfl: 0 .  dorzolamide-timolol (COSOPT) 22.3-6.8 MG/ML ophthalmic solution, Place 1 drop into both eyes 2 (two) times daily., Disp: 10 mL, Rfl: 11 .  finasteride (PROSCAR) 5 MG tablet, Take 1 tablet (5 mg total) by mouth daily., Disp: 90 tablet, Rfl: 3 .  fluticasone (FLONASE) 50 MCG/ACT nasal spray, One spray in each nostril twice a day, use left hand for right nostril, and right hand for left nostril., Disp: 48 g, Rfl: 3 .  hydrochlorothiazide (HYDRODIURIL) 25 MG tablet, TAKE 1 TABLET DAILY, Disp: 90 tablet, Rfl: 3 .  levothyroxine (SYNTHROID, LEVOTHROID) 75 MCG tablet, TAKE 1 TABLET DAILY, Disp: 90 tablet, Rfl: 1 .  loratadine (CLARITIN) 10 MG tablet, Take 1 tablet (10 mg total) by mouth daily., Disp: 90 tablet, Rfl: 3 .  omeprazole (PRILOSEC) 40 MG capsule, TAKE 1 CAPSULE DAILY AT DINNER TIME, Disp: 90 capsule, Rfl: 3 .  simvastatin (ZOCOR) 40 MG tablet, TAKE 1 TABLET  EVERY EVENING, Disp: 900 tablet, Rfl: 0 .  terazosin (HYTRIN) 5 MG capsule, TAKE 1 CAPSULE AT BEDTIME, Disp: 90 capsule, Rfl: 3:  :  No Known Allergies:  No family history on file.:  Social History   Social History  . Marital Status: Married    Spouse Name: N/A  . Number of Children: N/A  . Years of Education: N/A   Occupational History  . Not on file.   Social History Main Topics  . Smoking status: Never Smoker   . Smokeless tobacco: Not on file  . Alcohol Use: No  . Drug Use: No  . Sexual Activity: Yes   Other Topics Concern  . Not on file   Social History Narrative  :  Pertinent items are noted in HPI.  Exam: @IPVITALS @ Well-developed and well-nourished white male in  no obvious distress. Vital signs temperature of 97.7. Pulse 75. Blood pressure 156/67. Weight is 198 pounds. Head negative exam shows no ocular or oral lesions. There are no palpable cervical or supraclavicular lymph nodes. Lungs are clear bilaterally. Cardiac exam regular rate and rhythm with a normal S1 and S2. He has no murmurs, rubs or bruits. Abdomen is soft. Has good bowel sounds. There is no fluid wave. There is no palpable liver or spleen tip. Rectal exam shows no external hemorrhoids. No mass is noted in the rectal vault. Stool is brown. There is some heme positivity. Extremity shows no clubbing, cyanosis or edema. Neurological exam shows no focal neurological deficits. Skin exam shows no rashes, ecchymoses or petechia.    Recent Labs  09/30/15 1046  WBC 7.3  HGB 6.4*  HCT 21.2*  PLT 439*    Recent Labs  09/30/15 1046  NA 140  K 4.7  CL 107  CO2 25  GLUCOSE 92  BUN 28*  CREATININE 1.48*  CALCIUM 8.7    Blood smear review: Microcytic population of red blood cells. There is some hypochromia. There is some anisocytosis and poikilocytosis. There are no nucleated red blood cells. I see no teardrop cells. He has no schistocytes or spherocytes. There is no rouleau formation. White cells been normal in morphology maturation. He has no immature myeloid or lymphoid forms. There is no hypersegmented polys. I see no atypical lymphocytes. Platelets are adequate in number and size.  Pathology: None     Assessment and Plan:  [name] is a very nice 82 year old white male. He clearly has iron deficiency anemia. His blood smear is highly consistent with iron deficiency anemia.  He definitely needs to have a upper endoscopy and colonoscopy. His stool is heme positive.  He is pretty asymptomatic. As such, this GI bleeding is slow and obviously long-lasting. His body has been able to compensate.  I don't think we have to transfuse him at this point. I think we can give him iron. We'll  give him iron today. I'll give him iron in one week.  He says she's going to be seen by a gastroenterologist.  He does not need a bone marrow test. I don't see any other tests that we have to do. He does not any x-ray test. He had a CT angiogram of his chest.  I spent about 45 minutes with him. It has been very nice talking with him. He is a very interesting man.  We'll plan to get him back to see [place] in another 3 weeks. When he comes in for iron infusion, we will check his blood count.

## 2015-10-02 NOTE — Addendum Note (Signed)
Addended by: Silverio Decamp on: 10/02/2015 09:58 AM   Modules accepted: Orders

## 2015-10-03 DIAGNOSIS — K9089 Other intestinal malabsorption: Secondary | ICD-10-CM | POA: Diagnosis not present

## 2015-10-03 DIAGNOSIS — R0600 Dyspnea, unspecified: Secondary | ICD-10-CM | POA: Diagnosis not present

## 2015-10-03 DIAGNOSIS — D509 Iron deficiency anemia, unspecified: Secondary | ICD-10-CM | POA: Diagnosis not present

## 2015-10-03 LAB — ABO AND RH: Rh Type: POSITIVE

## 2015-10-03 LAB — IRON AND TIBC
%SAT: 4 % — AB (ref 20–55)
Iron: 13 ug/dL — ABNORMAL LOW (ref 42–163)
TIBC: 330 ug/dL (ref 202–409)
UIBC: 317 ug/dL (ref 117–376)

## 2015-10-03 LAB — FOLATE: Folate: 10.3 ng/mL (ref >5.4)

## 2015-10-03 LAB — FERRITIN
Ferritin: 10 ng/mL — ABNORMAL LOW (ref 20–380)
Ferritin: 13 ng/ml — ABNORMAL LOW (ref 22–316)

## 2015-10-03 LAB — RETICULOCYTES: Reticulocyte Count: 1.1 % (ref 0.6–2.6)

## 2015-10-03 LAB — ERYTHROPOIETIN: ERYTHROPOIETIN: 178.8 m[IU]/mL — AB (ref 2.6–18.5)

## 2015-10-03 LAB — VITAMIN B12: Vitamin B-12: 266 pg/mL (ref 200–1100)

## 2015-10-04 LAB — HEMOGLOBINOPATHY EVALUATION
HGB C: 0 %
HGB S: 0 %
Hemoglobin A2 Quantitation: 1.5 % (ref 0.7–3.1)
Hemoglobin F Quantitation: 0 % (ref 0.0–2.0)
Hgb A: 98.5 % — ABNORMAL HIGH (ref 94.0–98.0)

## 2015-10-05 DIAGNOSIS — K648 Other hemorrhoids: Secondary | ICD-10-CM | POA: Diagnosis not present

## 2015-10-05 DIAGNOSIS — D509 Iron deficiency anemia, unspecified: Secondary | ICD-10-CM | POA: Diagnosis not present

## 2015-10-05 DIAGNOSIS — D122 Benign neoplasm of ascending colon: Secondary | ICD-10-CM | POA: Diagnosis not present

## 2015-10-05 DIAGNOSIS — K2970 Gastritis, unspecified, without bleeding: Secondary | ICD-10-CM | POA: Diagnosis not present

## 2015-10-05 DIAGNOSIS — C180 Malignant neoplasm of cecum: Secondary | ICD-10-CM | POA: Diagnosis not present

## 2015-10-05 DIAGNOSIS — D120 Benign neoplasm of cecum: Secondary | ICD-10-CM | POA: Diagnosis not present

## 2015-10-09 ENCOUNTER — Encounter: Payer: Self-pay | Admitting: Hematology & Oncology

## 2015-10-10 ENCOUNTER — Other Ambulatory Visit (HOSPITAL_BASED_OUTPATIENT_CLINIC_OR_DEPARTMENT_OTHER): Payer: Medicare Other

## 2015-10-10 ENCOUNTER — Encounter: Payer: Self-pay | Admitting: Hematology & Oncology

## 2015-10-10 ENCOUNTER — Telehealth: Payer: Self-pay | Admitting: *Deleted

## 2015-10-10 ENCOUNTER — Other Ambulatory Visit: Payer: Self-pay | Admitting: Family

## 2015-10-10 ENCOUNTER — Ambulatory Visit (HOSPITAL_BASED_OUTPATIENT_CLINIC_OR_DEPARTMENT_OTHER): Payer: Medicare Other | Admitting: Hematology & Oncology

## 2015-10-10 ENCOUNTER — Ambulatory Visit (HOSPITAL_COMMUNITY)
Admission: RE | Admit: 2015-10-10 | Discharge: 2015-10-10 | Disposition: A | Discharge status: Home / Self Care | Payer: Medicare Other | Source: Ambulatory Visit | Attending: Hematology & Oncology | Admitting: Hematology & Oncology

## 2015-10-10 ENCOUNTER — Ambulatory Visit (HOSPITAL_BASED_OUTPATIENT_CLINIC_OR_DEPARTMENT_OTHER)
Admission: RE | Admit: 2015-10-10 | Discharge: 2015-10-10 | Disposition: A | Discharge status: Home / Self Care | Payer: Medicare Other | Source: Ambulatory Visit | Attending: Sports Medicine | Admitting: Sports Medicine

## 2015-10-10 ENCOUNTER — Ambulatory Visit (HOSPITAL_BASED_OUTPATIENT_CLINIC_OR_DEPARTMENT_OTHER): Payer: Medicare Other

## 2015-10-10 VITALS — BP 128/7 | HR 86 | Temp 97.8°F | Resp 20

## 2015-10-10 DIAGNOSIS — K922 Gastrointestinal hemorrhage, unspecified: Secondary | ICD-10-CM | POA: Diagnosis not present

## 2015-10-10 DIAGNOSIS — K921 Melena: Secondary | ICD-10-CM | POA: Diagnosis not present

## 2015-10-10 DIAGNOSIS — I059 Rheumatic mitral valve disease, unspecified: Secondary | ICD-10-CM | POA: Insufficient documentation

## 2015-10-10 DIAGNOSIS — D500 Iron deficiency anemia secondary to blood loss (chronic): Secondary | ICD-10-CM

## 2015-10-10 DIAGNOSIS — I77810 Thoracic aortic ectasia: Secondary | ICD-10-CM | POA: Diagnosis not present

## 2015-10-10 DIAGNOSIS — I119 Hypertensive heart disease without heart failure: Secondary | ICD-10-CM | POA: Insufficient documentation

## 2015-10-10 DIAGNOSIS — C180 Malignant neoplasm of cecum: Secondary | ICD-10-CM

## 2015-10-10 DIAGNOSIS — R6889 Other general symptoms and signs: Secondary | ICD-10-CM | POA: Diagnosis not present

## 2015-10-10 DIAGNOSIS — E785 Hyperlipidemia, unspecified: Secondary | ICD-10-CM | POA: Insufficient documentation

## 2015-10-10 DIAGNOSIS — K639 Disease of intestine, unspecified: Secondary | ICD-10-CM

## 2015-10-10 DIAGNOSIS — I352 Nonrheumatic aortic (valve) stenosis with insufficiency: Secondary | ICD-10-CM | POA: Diagnosis not present

## 2015-10-10 DIAGNOSIS — C182 Malignant neoplasm of ascending colon: Secondary | ICD-10-CM

## 2015-10-10 DIAGNOSIS — R0600 Dyspnea, unspecified: Secondary | ICD-10-CM | POA: Diagnosis not present

## 2015-10-10 DIAGNOSIS — C189 Malignant neoplasm of colon, unspecified: Secondary | ICD-10-CM

## 2015-10-10 HISTORY — DX: Malignant neoplasm of colon, unspecified: C18.9

## 2015-10-10 LAB — ECHOCARDIOGRAM COMPLETE
AO mean calculated velocity dopler: 140 cm/s
AV Area VTI index: 0.87 cm2/m2
AV Area VTI: 1.62 cm2
AV Area mean vel: 1.32 cm2
AV Mean grad: 9 mmHg
AV Peak grad: 17 mmHg
AV VEL mean LVOT/AV: 0.42
AV area mean vel ind: 0.63 cm2/m2
AV peak Index: 0.78
AV pk vel: 207 cm/s
AV vel: 1.81
Ao pk vel: 0.52 m/s
E decel time: 236 ms
E/e' ratio: 9.28
FS: 28 % (ref 28–44)
IVS/LV PW RATIO, ED: 0.99
LA ID, A-P, ES: 41 mm
LA diam end sys: 41 mm
LA diam index: 1.97 cm/m2
LA vol A4C: 74.2 mL
LV E/e' medial: 9.28
LV E/e'average: 9.28
LV PW d: 12 mm — AB (ref 0.6–1.1)
LV dias vol index: 40 mL/m2
LV dias vol: 83 mL (ref 62–150)
LV e' LATERAL: 8.05 cm/s
LV sys vol index: 16 mL/m2
LV sys vol: 33 mL (ref 21–61)
LVOT SV: 83 mL
LVOT VTI: 26.4 cm
LVOT area: 3.14 cm2
LVOT diameter: 20 mm
LVOT peak VTI: 0.58 cm
LVOT peak vel: 107 cm/s
MV Dec: 236
MV Peak grad: 2 mmHg
MV pk A vel: 121 m/s
MV pk E vel: 74.7 m/s
Simpson's disk: 60
Stroke v: 50 ml
TAPSE: 22.8 mm
TDI e' lateral: 8.05
TDI e' medial: 7.18
VTI: 45.7 cm
Valve area index: 0.87
Valve area: 1.81 cm2

## 2015-10-10 LAB — CBC WITH DIFFERENTIAL (CANCER CENTER ONLY)
BASO#: 0.1 10*3/uL (ref 0.0–0.2)
BASO%: 0.8 % (ref 0.0–2.0)
EOS ABS: 0.5 10*3/uL (ref 0.0–0.5)
EOS%: 6.5 % (ref 0.0–7.0)
HCT: 22.1 % — ABNORMAL LOW (ref 38.7–49.9)
HEMOGLOBIN: 6.7 g/dL — AB (ref 13.0–17.1)
LYMPH#: 1 10*3/uL (ref 0.9–3.3)
LYMPH%: 14.1 % (ref 14.0–48.0)
MCH: 23.9 pg — AB (ref 28.0–33.4)
MCHC: 30.3 g/dL — ABNORMAL LOW (ref 32.0–35.9)
MCV: 79 fL — AB (ref 82–98)
MONO#: 0.5 10*3/uL (ref 0.1–0.9)
MONO%: 6.8 % (ref 0.0–13.0)
NEUT%: 71.8 % (ref 40.0–80.0)
NEUTROS ABS: 5.3 10*3/uL (ref 1.5–6.5)
Platelets: 350 10*3/uL (ref 145–400)
RBC: 2.8 10*6/uL — AB (ref 4.20–5.70)
RDW: 22.9 % — ABNORMAL HIGH (ref 11.1–15.7)
WBC: 7.4 10*3/uL (ref 4.0–10.0)

## 2015-10-10 LAB — ABO/RH: ABO/RH(D): A POS

## 2015-10-10 LAB — CHCC SATELLITE - SMEAR

## 2015-10-10 MED ORDER — HEPARIN SOD (PORK) LOCK FLUSH 100 UNIT/ML IV SOLN
250.0000 [IU] | Freq: Once | INTRAVENOUS | Status: DC | PRN
  Filled 2015-10-10: qty 5

## 2015-10-10 MED ORDER — SODIUM CHLORIDE 0.9 % IV SOLN
Freq: Once | INTRAVENOUS | Status: DC
  Administered 2015-10-10: 12:00:00 via INTRAVENOUS

## 2015-10-10 MED ORDER — SODIUM CHLORIDE 0.9 % IV SOLN
510.0000 mg | Freq: Once | INTRAVENOUS | Status: DC
  Administered 2015-10-10: 510 mg via INTRAVENOUS
  Filled 2015-10-10: qty 17

## 2015-10-10 NOTE — Telephone Encounter (Signed)
Critical Value Hgb 6.7 Dr Ennever notified. No orders at this time.  

## 2015-10-10 NOTE — Progress Notes (Signed)
11:25 AM Pt placed in exam room to speak with Dr. Marin Olp.

## 2015-10-10 NOTE — Progress Notes (Signed)
Hematology and Oncology Follow Up Visit  Marcus Knappenberger Sr. [id number] Sep 04, 1933 82 y.o. 10/10/2015   Principle Diagnosis:   Iron deficiency anemia secondary to GI bleeding due to cecal mass  Current Therapy:    IV iron  Resection of cecal mass in 1-2 weeks  Blood transfusion with 2 units of packed blood cells on 6/30     Interim History:  Marcus Orr is back for an unscheduled visit. Shockingly enough, he had a colonoscopy done on 10/03/2015. This showed a mass in the cecum. Biopsies were done and the pathology report ([id number]) showed adenocarcinoma. He did have upper endoscopy also. This was relatively unremarkable.  He was in for his second dose of IV iron today. Once I found out about the cecal adenocarcinoma, I wanted to talk with him.  He sees the surgeon I think tomorrow.  His hemoglobin is 6.7. I think he will need 2 units of blood so that he will be much more ready for surgery.  He is not having any abdominal pain.  We first saw him last week, we did do a rectal exam on him which did show some blood in the stool.  Overall, his performance status is ECOG 0.  Medications:  Current outpatient prescriptions:  .  albuterol (PROVENTIL HFA;VENTOLIN HFA) 108 (90 Base) MCG/ACT inhaler, Inhale 2 puffs into the lungs every 6 (six) hours as needed for wheezing., Disp: 2 Inhaler, Rfl: 11 .  amLODipine (NORVASC) 10 MG tablet, Take 0.5 tablets (5 mg total) by mouth 2 (two) times daily., Disp: 180 tablet, Rfl: 1 .  budesonide-formoterol (SYMBICORT) 160-4.5 MCG/ACT inhaler, Inhale 1 puff into the lungs 2 (two) times daily. (Patient not taking: Reported on 10/10/2015), Disp: 1 Inhaler, Rfl: 0 .  dorzolamide-timolol (COSOPT) 22.3-6.8 MG/ML ophthalmic solution, Place 1 drop into both eyes 2 (two) times daily., Disp: 10 mL, Rfl: 11 .  finasteride (PROSCAR) 5 MG tablet, Take 1 tablet (5 mg total) by mouth daily., Disp: 90 tablet, Rfl: 3 .  fluticasone (FLONASE) 50 MCG/ACT nasal spray, One  spray in each nostril twice a day, use left hand for right nostril, and right hand for left nostril., Disp: 48 g, Rfl: 3 .  hydrochlorothiazide (HYDRODIURIL) 25 MG tablet, TAKE 1 TABLET DAILY, Disp: 90 tablet, Rfl: 3 .  levothyroxine (SYNTHROID, LEVOTHROID) 75 MCG tablet, TAKE 1 TABLET DAILY, Disp: 90 tablet, Rfl: 1 .  loratadine (CLARITIN) 10 MG tablet, Take 1 tablet (10 mg total) by mouth daily., Disp: 90 tablet, Rfl: 3 .  omeprazole (PRILOSEC) 40 MG capsule, TAKE 1 CAPSULE DAILY AT DINNER TIME, Disp: 90 capsule, Rfl: 3 .  simvastatin (ZOCOR) 40 MG tablet, TAKE 1 TABLET EVERY EVENING, Disp: 900 tablet, Rfl: 0 .  terazosin (HYTRIN) 5 MG capsule, TAKE 1 CAPSULE AT BEDTIME, Disp: 90 capsule, Rfl: 3  Allergies: No Known Allergies  Past Medical History, Surgical history, Social history, and Family History were reviewed and updated.  Review of Systems: As above  Physical Exam:  vitals were not taken for this visit.  Wt Readings from Last 3 Encounters:  10/02/15 198 lb (89.812 kg)  08/06/15 199 lb 8 oz (90.493 kg)  07/09/15 196 lb 1.6 oz (88.95 kg)     Head negative exam shows no ocular or oral lesions. There are no palpable cervical or supraclavicular lymph nodes. Lungs are clear bilaterally. Cardiac exam regular rate and rhythm with a normal S1 and S2. He has no murmurs, rubs or bruits. Abdomen is soft. Has good bowel sounds.  There is no fluid wave. There is no palpable liver or spleen tip. Extremity shows no clubbing, cyanosis or edema. Neurological exam shows no focal neurological deficits. Skin exam shows no rashes, ecchymoses or petechia. Axillary exam shows no axillary lymphadenopathy.  Lab Results  Component Value Date   WBC 7.4 10/10/2015   HGB 6.7* 10/10/2015   HCT 22.1* 10/10/2015   MCV 79* 10/10/2015   PLT 350 10/10/2015     Chemistry      Component Value Date/Time   NA 133 10/02/2015 1346   NA 140 09/30/2015 1046   K 4.0 10/02/2015 1346   K 4.7 09/30/2015 1046   CL  102 10/02/2015 1346   CL 107 09/30/2015 1046   CO2 27 10/02/2015 1346   CO2 25 09/30/2015 1046   BUN 27* 10/02/2015 1346   BUN 28* 09/30/2015 1046   CREATININE 1.7* 10/02/2015 1346      Component Value Date/Time   CALCIUM 8.6 10/02/2015 1346   CALCIUM 8.7 09/30/2015 1046   ALKPHOS 59 10/02/2015 1346   ALKPHOS 58 09/30/2015 1046   AST 23 10/02/2015 1346   AST 19 09/30/2015 1046   ALT 25 10/02/2015 1346   ALT 15 09/30/2015 1046   BILITOT 0.60 10/02/2015 1346   BILITOT 0.3 09/30/2015 1046         Impression and Plan: Marcus Orr is A 83 year old white male. He is in great shape. It is no surprise that he has a cecal mass. These typically only cause anemia secondary to chronic GI blood loss.  He says he did have a CT scan that was done. This did not show any obvious metastatic disease.  We will have to await the results of the surgery. We will have to see if he has any positive lymph nodes. If he does, and he would still be a candidate for adjuvant chemotherapy.  I need to make sure that his tumor is also sent off for the genetic markers. I would like to see the tumor sent off for KRAS, MMR/MSI, and HER-2.  Hopefully, the surgery will be set up for the next week or so.  I plan to see him back in about a month or so. By then, I would've thought that he would've had surgery.  I spent about 35 minutes with him this morning.   Volanda Napoleon, MD 6/28/20175:15 PM

## 2015-10-10 NOTE — Patient Instructions (Signed)

## 2015-10-10 NOTE — Progress Notes (Signed)
Echocardiogram 2D Echocardiogram has been performed.  Joelene Millin 10/10/2015, 10:14 AM

## 2015-10-11 DIAGNOSIS — C182 Malignant neoplasm of ascending colon: Secondary | ICD-10-CM | POA: Diagnosis not present

## 2015-10-11 DIAGNOSIS — D509 Iron deficiency anemia, unspecified: Secondary | ICD-10-CM | POA: Diagnosis not present

## 2015-10-11 LAB — RETICULOCYTES: RETICULOCYTE COUNT: 3.8 % — AB (ref 0.6–2.6)

## 2015-10-12 ENCOUNTER — Ambulatory Visit (HOSPITAL_BASED_OUTPATIENT_CLINIC_OR_DEPARTMENT_OTHER): Payer: Medicare Other

## 2015-10-12 VITALS — BP 163/87 | HR 60 | Temp 97.8°F | Resp 16

## 2015-10-12 DIAGNOSIS — K639 Disease of intestine, unspecified: Secondary | ICD-10-CM

## 2015-10-12 DIAGNOSIS — D500 Iron deficiency anemia secondary to blood loss (chronic): Secondary | ICD-10-CM | POA: Diagnosis not present

## 2015-10-12 DIAGNOSIS — K922 Gastrointestinal hemorrhage, unspecified: Secondary | ICD-10-CM | POA: Diagnosis not present

## 2015-10-12 LAB — PREPARE RBC (CROSSMATCH)

## 2015-10-12 MED ORDER — DIPHENHYDRAMINE HCL 25 MG PO CAPS
25.0000 mg | ORAL_CAPSULE | Freq: Once | ORAL | Status: AC
  Administered 2015-10-12: 25 mg via ORAL

## 2015-10-12 MED ORDER — ACETAMINOPHEN 325 MG PO TABS
650.0000 mg | ORAL_TABLET | Freq: Once | ORAL | Status: AC
  Administered 2015-10-12: 650 mg via ORAL

## 2015-10-12 MED ORDER — FUROSEMIDE 10 MG/ML IJ SOLN
20.0000 mg | Freq: Once | INTRAMUSCULAR | Status: AC
  Administered 2015-10-12: 20 mg via INTRAVENOUS

## 2015-10-12 MED ORDER — FUROSEMIDE 10 MG/ML IJ SOLN
INTRAMUSCULAR | Status: AC
  Filled 2015-10-12: qty 4

## 2015-10-12 MED ORDER — DIPHENHYDRAMINE HCL 25 MG PO CAPS
ORAL_CAPSULE | ORAL | Status: AC
  Filled 2015-10-12: qty 1

## 2015-10-12 MED ORDER — ACETAMINOPHEN 325 MG PO TABS
ORAL_TABLET | ORAL | Status: AC
  Filled 2015-10-12: qty 2

## 2015-10-12 NOTE — Progress Notes (Signed)
Assumed care of patient @ 1330; report received from [facility] RN.  Patient to call and notify Dr Marin Olp once surgery is scheduled and then follow up will be planned.

## 2015-10-12 NOTE — Patient Instructions (Signed)

## 2015-10-13 LAB — TYPE AND SCREEN
ABO/RH(D): A POS
ANTIBODY SCREEN: NEGATIVE
Unit division: 0
Unit division: 0

## 2015-10-17 ENCOUNTER — Encounter: Payer: Self-pay | Admitting: Hematology & Oncology

## 2015-10-25 ENCOUNTER — Telehealth: Payer: Self-pay

## 2015-10-25 DIAGNOSIS — C182 Malignant neoplasm of ascending colon: Secondary | ICD-10-CM | POA: Diagnosis not present

## 2015-10-25 DIAGNOSIS — R7989 Other specified abnormal findings of blood chemistry: Secondary | ICD-10-CM | POA: Diagnosis not present

## 2015-10-25 NOTE — Telephone Encounter (Signed)
There were some benign-appearing pulmonary nodules that just need a follow-up CT in December, there was also a lesion in the liver that looked like a benign cyst rather than a metastases. Further staging workup will be with his oncologist.

## 2015-10-25 NOTE — Telephone Encounter (Signed)
Pt would like for you to take another look at his CT Scan. Pt states another provider states he may have seen something and would like for you to take a look.

## 2015-10-26 ENCOUNTER — Ambulatory Visit: Payer: Medicare Other

## 2015-10-26 ENCOUNTER — Ambulatory Visit: Payer: Medicare Other | Admitting: Family

## 2015-10-26 ENCOUNTER — Other Ambulatory Visit: Payer: Medicare Other

## 2015-11-01 ENCOUNTER — Encounter: Payer: Self-pay | Admitting: Sports Medicine

## 2015-11-01 DIAGNOSIS — C182 Malignant neoplasm of ascending colon: Secondary | ICD-10-CM | POA: Diagnosis not present

## 2015-11-01 DIAGNOSIS — E785 Hyperlipidemia, unspecified: Secondary | ICD-10-CM | POA: Diagnosis not present

## 2015-11-01 DIAGNOSIS — I10 Essential (primary) hypertension: Secondary | ICD-10-CM | POA: Diagnosis not present

## 2015-11-01 DIAGNOSIS — I359 Nonrheumatic aortic valve disorder, unspecified: Secondary | ICD-10-CM | POA: Diagnosis not present

## 2015-11-01 DIAGNOSIS — I371 Nonrheumatic pulmonary valve insufficiency: Secondary | ICD-10-CM | POA: Diagnosis not present

## 2015-11-07 DIAGNOSIS — J4520 Mild intermittent asthma, uncomplicated: Secondary | ICD-10-CM | POA: Diagnosis not present

## 2015-11-07 DIAGNOSIS — C189 Malignant neoplasm of colon, unspecified: Secondary | ICD-10-CM | POA: Diagnosis not present

## 2015-11-07 DIAGNOSIS — R938 Abnormal findings on diagnostic imaging of other specified body structures: Secondary | ICD-10-CM | POA: Diagnosis not present

## 2015-11-07 DIAGNOSIS — Z01811 Encounter for preprocedural respiratory examination: Secondary | ICD-10-CM | POA: Diagnosis not present

## 2015-12-06 DIAGNOSIS — Z8673 Personal history of transient ischemic attack (TIA), and cerebral infarction without residual deficits: Secondary | ICD-10-CM | POA: Diagnosis not present

## 2015-12-06 DIAGNOSIS — I10 Essential (primary) hypertension: Secondary | ICD-10-CM | POA: Diagnosis not present

## 2015-12-06 DIAGNOSIS — Z01812 Encounter for preprocedural laboratory examination: Secondary | ICD-10-CM | POA: Diagnosis not present

## 2015-12-06 DIAGNOSIS — C182 Malignant neoplasm of ascending colon: Secondary | ICD-10-CM | POA: Diagnosis not present

## 2015-12-06 DIAGNOSIS — Z7951 Long term (current) use of inhaled steroids: Secondary | ICD-10-CM | POA: Diagnosis not present

## 2015-12-06 DIAGNOSIS — Z79899 Other long term (current) drug therapy: Secondary | ICD-10-CM | POA: Diagnosis not present

## 2015-12-12 DIAGNOSIS — Z79899 Other long term (current) drug therapy: Secondary | ICD-10-CM | POA: Diagnosis not present

## 2015-12-12 DIAGNOSIS — E039 Hypothyroidism, unspecified: Secondary | ICD-10-CM | POA: Diagnosis not present

## 2015-12-12 DIAGNOSIS — C180 Malignant neoplasm of cecum: Secondary | ICD-10-CM | POA: Diagnosis not present

## 2015-12-12 DIAGNOSIS — I10 Essential (primary) hypertension: Secondary | ICD-10-CM | POA: Diagnosis not present

## 2015-12-12 DIAGNOSIS — K219 Gastro-esophageal reflux disease without esophagitis: Secondary | ICD-10-CM | POA: Diagnosis not present

## 2015-12-12 DIAGNOSIS — Z8673 Personal history of transient ischemic attack (TIA), and cerebral infarction without residual deficits: Secondary | ICD-10-CM | POA: Diagnosis not present

## 2015-12-12 DIAGNOSIS — N400 Enlarged prostate without lower urinary tract symptoms: Secondary | ICD-10-CM | POA: Diagnosis not present

## 2015-12-12 DIAGNOSIS — D509 Iron deficiency anemia, unspecified: Secondary | ICD-10-CM | POA: Diagnosis present

## 2015-12-12 DIAGNOSIS — Z7982 Long term (current) use of aspirin: Secondary | ICD-10-CM | POA: Diagnosis not present

## 2015-12-12 DIAGNOSIS — E785 Hyperlipidemia, unspecified: Secondary | ICD-10-CM | POA: Diagnosis not present

## 2016-01-04 ENCOUNTER — Ambulatory Visit (INDEPENDENT_AMBULATORY_CARE_PROVIDER_SITE_OTHER): Payer: Medicare Other | Admitting: Sports Medicine

## 2016-01-04 ENCOUNTER — Encounter: Payer: Self-pay | Admitting: Sports Medicine

## 2016-01-04 VITALS — BP 132/73 | HR 72 | Ht 70.0 in | Wt 188.2 lb

## 2016-01-04 DIAGNOSIS — I10 Essential (primary) hypertension: Secondary | ICD-10-CM | POA: Diagnosis not present

## 2016-01-04 DIAGNOSIS — R918 Other nonspecific abnormal finding of lung field: Secondary | ICD-10-CM

## 2016-01-04 DIAGNOSIS — E039 Hypothyroidism, unspecified: Secondary | ICD-10-CM

## 2016-01-04 DIAGNOSIS — Z0000 Encounter for general adult medical examination without abnormal findings: Secondary | ICD-10-CM

## 2016-01-04 DIAGNOSIS — C182 Malignant neoplasm of ascending colon: Secondary | ICD-10-CM

## 2016-01-04 DIAGNOSIS — E785 Hyperlipidemia, unspecified: Secondary | ICD-10-CM | POA: Diagnosis not present

## 2016-01-04 NOTE — Assessment & Plan Note (Signed)
Post hemicolectomy, suspected surgical cure.

## 2016-01-04 NOTE — Progress Notes (Signed)
Subjective:    Marcus Bridwell Sr. is a 82 y.o. male who presents for Medicare Annual/Subsequent preventive examination.   Preventive Screening-Counseling & Management  Tobacco History  Smoking Status  . Never Smoker  Smokeless Tobacco  . Not on file    Problems Prior to Visit 1.   Current Problems (verified) Patient Active Problem List   Diagnosis Date Noted  . Colon cancer ([place]) 10/10/2015  . Iron deficiency anemia due to chronic blood loss 10/02/2015  . Lower GI bleed 10/02/2015  . Pulmonary nodules 10/02/2015  . Decreased exercise tolerance 06/25/2015  . History of bilateral knee arthroplasty 03/06/2015  . Actinic keratosis 01/13/2013  . Renal insufficiency 07/14/2012  . BPH (benign prostatic hyperplasia) 06/16/2012  . Essential hypertension, benign 06/16/2012  . Hyperlipidemia LDL goal <100 06/16/2012  . Hypothyroidism 06/16/2012  . Glaucoma 06/16/2012  . Laryngopharyngeal reflux 06/16/2012  . Perennial allergic rhinitis 06/16/2012  . Preventive measure 06/16/2012    Medications Prior to Visit Current Outpatient Prescriptions on File Prior to Visit  Medication Sig Dispense Refill  . albuterol (PROVENTIL HFA;VENTOLIN HFA) 108 (90 Base) MCG/ACT inhaler Inhale 2 puffs into the lungs every 6 (six) hours as needed for wheezing. 2 Inhaler 11  . amLODipine (NORVASC) 10 MG tablet Take 0.5 tablets (5 mg total) by mouth 2 (two) times daily. 180 tablet 1  . budesonide-formoterol (SYMBICORT) 160-4.5 MCG/ACT inhaler Inhale 1 puff into the lungs 2 (two) times daily. 1 Inhaler 0  . dorzolamide-timolol (COSOPT) 22.3-6.8 MG/ML ophthalmic solution Place 1 drop into both eyes 2 (two) times daily. 10 mL 11  . finasteride (PROSCAR) 5 MG tablet Take 1 tablet (5 mg total) by mouth daily. 90 tablet 3  . fluticasone (FLONASE) 50 MCG/ACT nasal spray One spray in each nostril twice a day, use left hand for right nostril, and right hand for left nostril. 48 g 3  . hydrochlorothiazide  (HYDRODIURIL) 25 MG tablet TAKE 1 TABLET DAILY 90 tablet 3  . levothyroxine (SYNTHROID, LEVOTHROID) 75 MCG tablet TAKE 1 TABLET DAILY 90 tablet 1  . loratadine (CLARITIN) 10 MG tablet Take 1 tablet (10 mg total) by mouth daily. 90 tablet 3  . omeprazole (PRILOSEC) 40 MG capsule TAKE 1 CAPSULE DAILY AT DINNER TIME 90 capsule 3  . simvastatin (ZOCOR) 40 MG tablet TAKE 1 TABLET EVERY EVENING 900 tablet 0  . terazosin (HYTRIN) 5 MG capsule TAKE 1 CAPSULE AT BEDTIME 90 capsule 3   No current facility-administered medications on file prior to visit.     Current Medications (verified) Current Outpatient Prescriptions  Medication Sig Dispense Refill  . albuterol (PROVENTIL HFA;VENTOLIN HFA) 108 (90 Base) MCG/ACT inhaler Inhale 2 puffs into the lungs every 6 (six) hours as needed for wheezing. 2 Inhaler 11  . amLODipine (NORVASC) 10 MG tablet Take 0.5 tablets (5 mg total) by mouth 2 (two) times daily. 180 tablet 1  . budesonide-formoterol (SYMBICORT) 160-4.5 MCG/ACT inhaler Inhale 1 puff into the lungs 2 (two) times daily. 1 Inhaler 0  . dorzolamide-timolol (COSOPT) 22.3-6.8 MG/ML ophthalmic solution Place 1 drop into both eyes 2 (two) times daily. 10 mL 11  . finasteride (PROSCAR) 5 MG tablet Take 1 tablet (5 mg total) by mouth daily. 90 tablet 3  . fluticasone (FLONASE) 50 MCG/ACT nasal spray One spray in each nostril twice a day, use left hand for right nostril, and right hand for left nostril. 48 g 3  . hydrochlorothiazide (HYDRODIURIL) 25 MG tablet TAKE 1 TABLET DAILY 90 tablet 3  .  levothyroxine (SYNTHROID, LEVOTHROID) 75 MCG tablet TAKE 1 TABLET DAILY 90 tablet 1  . loratadine (CLARITIN) 10 MG tablet Take 1 tablet (10 mg total) by mouth daily. 90 tablet 3  . omeprazole (PRILOSEC) 40 MG capsule TAKE 1 CAPSULE DAILY AT DINNER TIME 90 capsule 3  . simvastatin (ZOCOR) 40 MG tablet TAKE 1 TABLET EVERY EVENING 900 tablet 0  . terazosin (HYTRIN) 5 MG capsule TAKE 1 CAPSULE AT BEDTIME 90 capsule 3    No current facility-administered medications for this visit.      Allergies (verified) Review of patient's allergies indicates no known allergies.   PAST HISTORY  Family History No family history on file.  Social History Social History  Substance Use Topics  . Smoking status: Never Smoker  . Smokeless tobacco: Not on file  . Alcohol use No    Are there smokers in your home (other than you)?  No  Risk Factors Current exercise habits: Walking and yard work Dietary issues discussed: Not needed   Cardiac risk factors: advanced age (older than 51 for men, 40 for women), dyslipidemia, hypertension and male gender.  Depression Screen (Note: if answer to either of the following is "Yes", a more complete depression screening is indicated)   Q1: Over the past two weeks, have you felt down, depressed or hopeless? No  Q2: Over the past two weeks, have you felt little interest or pleasure in doing things? No  Have you lost interest or pleasure in daily life? No  Do you often feel hopeless? No  Do you cry easily over simple problems? No  Activities of Daily Living In your present state of health, do you have any difficulty performing the following activities?:  Driving? No Managing money?  No Feeding yourself? No Getting from bed to chair? No Climbing a flight of stairs? No Preparing food and eating?: No Bathing or showering? No Getting dressed: No Getting to the toilet? No Using the toilet:No Moving around from place to place: No In the past year have you fallen or had a near fall?:No   Are you sexually active?  No  Do you have more than one partner?  No  Hearing Difficulties: Yes Do you often ask people to speak up or repeat themselves? Yes Do you experience ringing or noises in your ears? Yes Do you have difficulty understanding soft or whispered voices? Yes   Do you feel that you have a problem with memory? No  Do you often misplace items? No  Do you feel safe at  home?  Yes  Cognitive Testing  Alert? Yes  Normal Appearance?Yes  Oriented to person? Yes  Place? Yes   Time? Yes  Recall of three objects?  Yes  Can perform simple calculations? Yes  Displays appropriate judgment?Yes  Can read the correct time from a watch face?Yes   Advanced Directives have been discussed with the patient? Yes   List the Names of Other Physician/Practitioners you currently use: 1.    Indicate any recent Medical Services you may have received from other than [facility] providers in the past year (date may be approximate).  Immunization History  Administered Date(s) Administered  . Influenza Whole 04/15/2011  . Influenza,inj,Quad PF,36+ Mos 01/13/2013, 01/04/2015  . Pneumococcal Conjugate-13 01/04/2015    Screening Tests Health Maintenance  Topic Date Due  . INFLUENZA VACCINE  11/13/2015  . PNA vac Low Risk Adult (2 of 2 - PPSV23) 01/04/2016  . TETANUS/TDAP  04/14/2018  . ZOSTAVAX  Addressed  All answers were reviewed with the patient and necessary referrals were made:  Aundria Mems, MD   01/04/2016   History reviewed: allergies, current medications, past family history, past medical history, past social history, past surgical history and problem list  Review of Systems A comprehensive review of systems was negative.    Objective:     Vision by Snellen chart: right eye:20/20, left eye:20/20 Blood pressure 132/73, pulse 72, height 5\' 10"  (1.778 m), weight 188 lb 3.2 oz (85.4 kg). Body mass index is 27 kg/m. General: Well Developed, well nourished, and in no acute distress.  Neuro: Alert and oriented x3, extra-ocular muscles intact, sensation grossly intact. Cranial nerves II through XII are intact, motor, sensory, and coordinative functions are all intact. HEENT: Normocephalic, atraumatic, pupils equal round reactive to light, neck supple, no masses, no lymphadenopathy, thyroid nonpalpable. Oropharynx, nasopharynx, external ear canals are  unremarkable. Skin: Warm and dry, no rashes noted.  Cardiac: Regular rate and rhythm, no murmurs rubs or gallops.  Respiratory: Clear to auscultation bilaterally. Not using accessory muscles, speaking in full sentences.  Abdominal: Soft, nontender, nondistended, positive bowel sounds, no masses, no organomegaly.  Musculoskeletal: Shoulder, elbow, wrist, hip, knee, ankle stable, and with full range of motion.   Assessment:     Healthy male post colectomy for adenocarcinoma with suspected surgical cure.     Plan:     During the course of the visit the patient was educated and counseled about appropriate screening and preventive services including:    See below  Diet review for nutrition referral? Yes ____  Not Indicated __x__   Patient Instructions (the written plan) was given to the patient.  Medicare Attestation I have personally reviewed: The patient's medical and social history Their use of alcohol, tobacco or illicit drugs Their current medications and supplements The patient's functional ability including ADLs,fall risks, home safety risks, cognitive, and hearing and visual impairment Diet and physical activities Evidence for depression or mood disorders  The patient's weight, height, BMI, and visual acuity have been recorded in the chart.  I have made referrals, counseling, and provided education to the patient based on review of the above and I have provided the patient with a written personalized care plan for preventive services.     Aundria Mems, MD   01/04/2016

## 2016-01-04 NOTE — Assessment & Plan Note (Signed)
Rechecking TSH 

## 2016-01-04 NOTE — Assessment & Plan Note (Signed)
Well controlled, no changes 

## 2016-01-04 NOTE — Assessment & Plan Note (Signed)
Rechecking lipids, previously well controlled.

## 2016-01-04 NOTE — Assessment & Plan Note (Signed)
Medicare physical as above, return in one year.

## 2016-01-04 NOTE — Assessment & Plan Note (Signed)
Currently followed by pulmonology at [facility]. 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 Bronchoscopy and long-term antibiotics planned by pulmonology.

## 2016-01-07 DIAGNOSIS — E039 Hypothyroidism, unspecified: Secondary | ICD-10-CM | POA: Diagnosis not present

## 2016-01-07 DIAGNOSIS — E785 Hyperlipidemia, unspecified: Secondary | ICD-10-CM | POA: Diagnosis not present

## 2016-01-07 DIAGNOSIS — C182 Malignant neoplasm of ascending colon: Secondary | ICD-10-CM | POA: Diagnosis not present

## 2016-01-07 LAB — CBC
HCT: 37.1 % — ABNORMAL LOW (ref 38.5–50.0)
Hemoglobin: 11.8 g/dL — ABNORMAL LOW (ref 13.2–17.1)
MCH: 27.1 pg (ref 27.0–33.0)
MCHC: 31.8 g/dL — ABNORMAL LOW (ref 32.0–36.0)
MCV: 85.3 fL (ref 80.0–100.0)
MPV: 9.3 fL (ref 7.5–12.5)
Platelets: 314 K/uL (ref 140–400)
RBC: 4.35 MIL/uL (ref 4.20–5.80)
RDW: 18.2 % — ABNORMAL HIGH (ref 11.0–15.0)
WBC: 6.2 10*3/uL (ref 3.8–10.8)

## 2016-01-08 LAB — COMPREHENSIVE METABOLIC PANEL
ALT: 11 U/L (ref 9–46)
AST: 15 U/L (ref 10–35)
Alkaline Phosphatase: 68 U/L (ref 40–115)
BUN: 20 mg/dL (ref 7–25)
Calcium: 9.2 mg/dL (ref 8.6–10.3)
Creat: 1.43 mg/dL — ABNORMAL HIGH (ref 0.70–1.11)
Glucose, Bld: 94 mg/dL (ref 65–99)
Sodium: 142 mmol/L (ref 135–146)
Total Bilirubin: 0.4 mg/dL (ref 0.2–1.2)

## 2016-01-08 LAB — LIPID PANEL
Cholesterol: 120 mg/dL — ABNORMAL LOW (ref 125–200)
HDL: 37 mg/dL — ABNORMAL LOW (ref >=40)
LDL Cholesterol: 59 mg/dL (ref <130)
Total CHOL/HDL Ratio: 3.2 ratio (ref <=5.0)
Triglycerides: 118 mg/dL (ref <150)
VLDL: 24 mg/dL (ref <30)

## 2016-01-08 LAB — TSH: TSH: 2.72 mIU/L (ref 0.40–4.50)

## 2016-01-08 LAB — COMPREHENSIVE METABOLIC PANEL WITH GFR
Albumin: 3.8 g/dL (ref 3.6–5.1)
CO2: 28 mmol/L (ref 20–31)
Chloride: 106 mmol/L (ref 98–110)
Potassium: 4.3 mmol/L (ref 3.5–5.3)
Total Protein: 6.5 g/dL (ref 6.1–8.1)

## 2016-01-15 ENCOUNTER — Other Ambulatory Visit: Payer: Self-pay | Admitting: Sports Medicine

## 2016-01-29 DIAGNOSIS — C180 Malignant neoplasm of cecum: Secondary | ICD-10-CM | POA: Diagnosis not present

## 2016-02-11 ENCOUNTER — Telehealth: Payer: Self-pay

## 2016-02-11 NOTE — Telephone Encounter (Signed)
Which labs?  And what's the date of service? I'm going to forward to Lacretia Nicks as well to look into.

## 2016-02-11 NOTE — Telephone Encounter (Signed)
Pt left VM stating he's being billed by the lab and insurance isn't covering it. States the labs have been coded incorrectly. Please assist.

## 2016-02-12 DIAGNOSIS — E039 Hypothyroidism, unspecified: Secondary | ICD-10-CM | POA: Diagnosis not present

## 2016-02-12 DIAGNOSIS — H02833 Dermatochalasis of right eye, unspecified eyelid: Secondary | ICD-10-CM | POA: Diagnosis not present

## 2016-02-12 DIAGNOSIS — Z961 Presence of intraocular lens: Secondary | ICD-10-CM | POA: Diagnosis not present

## 2016-02-12 DIAGNOSIS — H01009 Unspecified blepharitis unspecified eye, unspecified eyelid: Secondary | ICD-10-CM | POA: Diagnosis not present

## 2016-02-12 DIAGNOSIS — Z79899 Other long term (current) drug therapy: Secondary | ICD-10-CM | POA: Diagnosis not present

## 2016-02-12 DIAGNOSIS — H401132 Primary open-angle glaucoma, bilateral, moderate stage: Secondary | ICD-10-CM | POA: Diagnosis not present

## 2016-02-12 DIAGNOSIS — Z7982 Long term (current) use of aspirin: Secondary | ICD-10-CM | POA: Diagnosis not present

## 2016-02-12 DIAGNOSIS — I10 Essential (primary) hypertension: Secondary | ICD-10-CM | POA: Diagnosis not present

## 2016-02-12 DIAGNOSIS — H02836 Dermatochalasis of left eye, unspecified eyelid: Secondary | ICD-10-CM | POA: Diagnosis not present

## 2016-02-12 NOTE — Telephone Encounter (Addendum)
States that his records says it's services rendered by you. Says that no insurance was on file for the visit. [facility] also states that there was not enough information on the labs for it to be covered that was ordered by you.

## 2016-02-12 NOTE — Telephone Encounter (Signed)
Those labs were still done by Dr. Marin Olp, I ordered bloodwork on 6/20 but it was never done so wouldn't be contributing to any charges.  Should contact Dr. Antonieta Pert office.

## 2016-02-12 NOTE — Telephone Encounter (Signed)
Days were incorrect it's 10/02/15 from the telephone note.

## 2016-02-12 NOTE — Telephone Encounter (Signed)
Yes this needs to go to [place].  Bloodwork on the 20th was not done by me.

## 2016-02-12 NOTE — Telephone Encounter (Signed)
He is asking the wrong practice, these were done by his hematologist.

## 2016-02-12 NOTE — Telephone Encounter (Signed)
Labs completed on 10/10/15.

## 2016-02-26 DIAGNOSIS — C182 Malignant neoplasm of ascending colon: Secondary | ICD-10-CM | POA: Diagnosis not present

## 2016-03-28 DIAGNOSIS — Z23 Encounter for immunization: Secondary | ICD-10-CM | POA: Diagnosis not present

## 2016-04-17 NOTE — Telephone Encounter (Signed)
I called [facility] and spoke with [facility].  I added 2 diagnosis codes, C18.2 and R91.8 and ask Maryann to refile the claim.  It will take 4 to 6 weeks to reprocess.

## 2016-04-18 ENCOUNTER — Other Ambulatory Visit: Payer: Self-pay | Admitting: Sports Medicine

## 2016-05-06 ENCOUNTER — Telehealth: Payer: Self-pay

## 2016-05-06 MED ORDER — AZITHROMYCIN 250 MG PO TABS: ORAL_TABLET | ORAL | 0 refills | Status: DC

## 2016-05-06 MED ORDER — PREDNISONE 50 MG PO TABS: ORAL_TABLET | ORAL | 0 refills | Status: DC

## 2016-05-06 NOTE — Telephone Encounter (Signed)
Pt left VM stating he has a productive cough with history of chronic bronchitis and would like to know if you will send him something in to the pharmacy. Pt states he is due to go out of the country again soon. Please assist.

## 2016-05-06 NOTE — Telephone Encounter (Signed)
No problem, adding a burst of prednisone and azithromycin.

## 2016-05-07 NOTE — Telephone Encounter (Signed)
Pt.notified

## 2016-05-08 DIAGNOSIS — L821 Other seborrheic keratosis: Secondary | ICD-10-CM | POA: Diagnosis not present

## 2016-05-08 DIAGNOSIS — L814 Other melanin hyperpigmentation: Secondary | ICD-10-CM | POA: Diagnosis not present

## 2016-05-08 DIAGNOSIS — Z85828 Personal history of other malignant neoplasm of skin: Secondary | ICD-10-CM | POA: Diagnosis not present

## 2016-05-08 DIAGNOSIS — L579 Skin changes due to chronic exposure to nonionizing radiation, unspecified: Secondary | ICD-10-CM | POA: Diagnosis not present

## 2016-06-16 ENCOUNTER — Ambulatory Visit (INDEPENDENT_AMBULATORY_CARE_PROVIDER_SITE_OTHER): Payer: Medicare Other

## 2016-06-16 ENCOUNTER — Ambulatory Visit (INDEPENDENT_AMBULATORY_CARE_PROVIDER_SITE_OTHER): Payer: Medicare Other | Admitting: Sports Medicine

## 2016-06-16 DIAGNOSIS — I700 Atherosclerosis of aorta: Secondary | ICD-10-CM

## 2016-06-16 DIAGNOSIS — J4541 Moderate persistent asthma with (acute) exacerbation: Secondary | ICD-10-CM | POA: Diagnosis not present

## 2016-06-16 DIAGNOSIS — J45909 Unspecified asthma, uncomplicated: Secondary | ICD-10-CM

## 2016-06-16 DIAGNOSIS — M1611 Unilateral primary osteoarthritis, right hip: Secondary | ICD-10-CM

## 2016-06-16 DIAGNOSIS — M5136 Other intervertebral disc degeneration, lumbar region: Secondary | ICD-10-CM

## 2016-06-16 DIAGNOSIS — R05 Cough: Secondary | ICD-10-CM | POA: Diagnosis not present

## 2016-06-16 DIAGNOSIS — M25551 Pain in right hip: Secondary | ICD-10-CM | POA: Diagnosis not present

## 2016-06-16 MED ORDER — AZITHROMYCIN 250 MG PO TABS: ORAL_TABLET | ORAL | 0 refills | Status: DC

## 2016-06-16 MED ORDER — PREDNISONE 50 MG PO TABS: ORAL_TABLET | ORAL | 0 refills | Status: DC

## 2016-06-16 NOTE — Progress Notes (Signed)
  Subjective:    CC: Right hip pain  HPI: This is a pleasant 83 year old male, for several months he's had pain that he localizes over the right anterior groin, moderate, persistent with radiation into the anterior thigh, with gelling. No trauma, no constitutional symptoms. Has tried oral NSAIDs and analgesics without any improvement.  Lately he is also had increasing cough, mild shortness of breath. Wheezing.  Past medical history:  Negative.  See flowsheet/record as well for more information.  Surgical history: Negative.  See flowsheet/record as well for more information.  Family history: Negative.  See flowsheet/record as well for more information.  Social history: Negative.  See flowsheet/record as well for more information.  Allergies, and medications have been entered into the medical record, reviewed, and no changes needed.   Review of Systems: No fevers, chills, night sweats, weight loss, chest pain, or shortness of breath.   Objective:    General: Well Developed, well nourished, and in no acute distress.  Neuro: Alert and oriented x3, extra-ocular muscles intact, sensation grossly intact.  HEENT: Normocephalic, atraumatic, pupils equal round reactive to light, neck supple, no masses, no lymphadenopathy, thyroid nonpalpable.  Skin: Warm and dry, no rashes. Cardiac: Regular rate and rhythm, no murmurs rubs or gallops, no lower extremity edema.  Respiratory: Coarse sounds with wheezing bilaterally, diffuse and expiratory. Not using accessory muscles, speaking in full sentences. Right Hip: ROM IR: 30 with severe pain in the groin, ER: 60 Deg, Flexion: 120 Deg, Extension: 100 Deg, Abduction: 45 Deg, Adduction: 45 Deg Strength IR: 5/5, ER: 5/5, Flexion: 5/5, Extension: 5/5, Abduction: 5/5, Adduction: 5/5 Pelvic alignment unremarkable to inspection and palpation. Standing hip rotation and gait without trendelenburg / unsteadiness. Greater trochanter without tenderness to  palpation. No tenderness over piriformis. No SI joint tenderness and normal minimal SI movement.  Procedure: Real-time Ultrasound Guided Injection of right hip joint Device: GE Logiq E  Verbal informed consent obtained.  Time-out conducted.  Noted no overlying erythema, induration, or other signs of local infection.  Skin prepped in a sterile fashion.  Local anesthesia: Topical Ethyl chloride.  With sterile technique and under real time ultrasound guidance:  22-gauge spinal needle advanced to the femoral head/neck junction, noted hip joint effusion, 1 mL kenalog 40, 1 mL lidocaine, 1 mL bupivacaine injected easily. Completed without difficulty  Pain immediately resolved suggesting accurate placement of the medication.  Advised to call if fevers/chills, erythema, induration, drainage, or persistent bleeding.  Images permanently stored and available for review in the ultrasound unit.  Impression: Technically successful ultrasound guided injection.  Chest x-ray shows a bit of left lower lobe atelectasis.  Hip x-rays personally reviewed and show loss of joint space at the superior articular weightbearing surface of the right hip, there is subchondral sclerosis visible as well.  Impression and Recommendations:    Primary osteoarthritis of right hip Hip joint injection as above, has failed oral over-the-counter NSAIDs and analgesics. X-rays. Rehabilitation exercises given. Return to see me in one month.  Asthmatic bronchitis Chest x-ray, prednisone, azithromycin. He does need to get more compliant with his Symbicort.

## 2016-06-16 NOTE — Assessment & Plan Note (Signed)
Hip joint injection as above, has failed oral over-the-counter NSAIDs and analgesics. X-rays. Rehabilitation exercises given. Return to see me in one month.

## 2016-06-16 NOTE — Assessment & Plan Note (Signed)
Chest x-ray, prednisone, azithromycin. He does need to get more compliant with his Symbicort.

## 2016-06-17 DIAGNOSIS — R4702 Dysphasia: Secondary | ICD-10-CM | POA: Diagnosis present

## 2016-06-17 DIAGNOSIS — D539 Nutritional anemia, unspecified: Secondary | ICD-10-CM | POA: Diagnosis not present

## 2016-06-17 DIAGNOSIS — G935 Compression of brain: Secondary | ICD-10-CM | POA: Diagnosis present

## 2016-06-17 DIAGNOSIS — I614 Nontraumatic intracerebral hemorrhage in cerebellum: Secondary | ICD-10-CM | POA: Diagnosis not present

## 2016-06-17 DIAGNOSIS — D72829 Elevated white blood cell count, unspecified: Secondary | ICD-10-CM | POA: Diagnosis present

## 2016-06-17 DIAGNOSIS — E039 Hypothyroidism, unspecified: Secondary | ICD-10-CM | POA: Diagnosis present

## 2016-06-17 DIAGNOSIS — J811 Chronic pulmonary edema: Secondary | ICD-10-CM | POA: Diagnosis not present

## 2016-06-17 DIAGNOSIS — R05 Cough: Secondary | ICD-10-CM | POA: Diagnosis not present

## 2016-06-17 DIAGNOSIS — I129 Hypertensive chronic kidney disease with stage 1 through stage 4 chronic kidney disease, or unspecified chronic kidney disease: Secondary | ICD-10-CM | POA: Diagnosis present

## 2016-06-17 DIAGNOSIS — Z8673 Personal history of transient ischemic attack (TIA), and cerebral infarction without residual deficits: Secondary | ICD-10-CM | POA: Diagnosis not present

## 2016-06-17 DIAGNOSIS — J45909 Unspecified asthma, uncomplicated: Secondary | ICD-10-CM | POA: Diagnosis not present

## 2016-06-17 DIAGNOSIS — Z96653 Presence of artificial knee joint, bilateral: Secondary | ICD-10-CM | POA: Diagnosis present

## 2016-06-17 DIAGNOSIS — I10 Essential (primary) hypertension: Secondary | ICD-10-CM | POA: Diagnosis not present

## 2016-06-17 DIAGNOSIS — I519 Heart disease, unspecified: Secondary | ICD-10-CM | POA: Diagnosis not present

## 2016-06-17 DIAGNOSIS — I629 Nontraumatic intracranial hemorrhage, unspecified: Secondary | ICD-10-CM | POA: Diagnosis not present

## 2016-06-17 DIAGNOSIS — I619 Nontraumatic intracerebral hemorrhage, unspecified: Secondary | ICD-10-CM | POA: Diagnosis present

## 2016-06-17 DIAGNOSIS — I161 Hypertensive emergency: Secondary | ICD-10-CM | POA: Diagnosis present

## 2016-06-17 DIAGNOSIS — N400 Enlarged prostate without lower urinary tract symptoms: Secondary | ICD-10-CM | POA: Diagnosis present

## 2016-06-17 DIAGNOSIS — Z85038 Personal history of other malignant neoplasm of large intestine: Secondary | ICD-10-CM | POA: Diagnosis not present

## 2016-06-17 DIAGNOSIS — I610 Nontraumatic intracerebral hemorrhage in hemisphere, subcortical: Secondary | ICD-10-CM | POA: Diagnosis not present

## 2016-06-17 DIAGNOSIS — N183 Chronic kidney disease, stage 3 (moderate): Secondary | ICD-10-CM | POA: Diagnosis present

## 2016-06-17 DIAGNOSIS — Z23 Encounter for immunization: Secondary | ICD-10-CM | POA: Diagnosis not present

## 2016-06-17 DIAGNOSIS — I358 Other nonrheumatic aortic valve disorders: Secondary | ICD-10-CM | POA: Diagnosis not present

## 2016-06-17 DIAGNOSIS — Z9049 Acquired absence of other specified parts of digestive tract: Secondary | ICD-10-CM | POA: Diagnosis not present

## 2016-06-17 DIAGNOSIS — D649 Anemia, unspecified: Secondary | ICD-10-CM | POA: Diagnosis present

## 2016-06-25 DIAGNOSIS — I610 Nontraumatic intracerebral hemorrhage in hemisphere, subcortical: Secondary | ICD-10-CM | POA: Diagnosis not present

## 2016-06-25 DIAGNOSIS — I10 Essential (primary) hypertension: Secondary | ICD-10-CM | POA: Diagnosis not present

## 2016-06-25 DIAGNOSIS — R4701 Aphasia: Secondary | ICD-10-CM | POA: Diagnosis not present

## 2016-07-04 ENCOUNTER — Telehealth: Payer: Self-pay

## 2016-07-04 DIAGNOSIS — M1611 Unilateral primary osteoarthritis, right hip: Secondary | ICD-10-CM

## 2016-07-04 MED ORDER — ACETAMINOPHEN ER 650 MG PO TBCR: EXTENDED_RELEASE_TABLET | ORAL | 3 refills | Status: DC

## 2016-07-04 NOTE — Telephone Encounter (Signed)
Did the injection help at all? Even temporarily? Ultimately because he is a family member I cannot prescribe controlled substances, due to his recent intracranial bleed we should avoid anti-inflammatories. For now he needs to take Tylenol arthritis strength 1-2 tabs 3 times per day. In addition I would like to go ahead and get an MRI of his hip to ensure there is no avascular necrosis, if there is a persistent hip effusion I can drain it. Ultimately if he is going to need narcotics I would have to refer him to one of my colleagues (Dr. Georgina Snell).

## 2016-07-04 NOTE — Telephone Encounter (Signed)
Pt left VM stating he's in a lot of pain after his injection. States that he recently had a stroke and would like to know what he can do. Please advise.

## 2016-07-05 ENCOUNTER — Ambulatory Visit (HOSPITAL_BASED_OUTPATIENT_CLINIC_OR_DEPARTMENT_OTHER)
Admission: RE | Admit: 2016-07-05 | Discharge: 2016-07-05 | Disposition: A | Discharge status: Home / Self Care | Payer: Medicare Other | Source: Ambulatory Visit | Attending: Sports Medicine | Admitting: Sports Medicine

## 2016-07-05 ENCOUNTER — Other Ambulatory Visit: Payer: Self-pay | Admitting: Sports Medicine

## 2016-07-05 DIAGNOSIS — M25551 Pain in right hip: Secondary | ICD-10-CM | POA: Diagnosis not present

## 2016-07-05 DIAGNOSIS — M47816 Spondylosis without myelopathy or radiculopathy, lumbar region: Secondary | ICD-10-CM | POA: Insufficient documentation

## 2016-07-05 DIAGNOSIS — M25451 Effusion, right hip: Secondary | ICD-10-CM | POA: Insufficient documentation

## 2016-07-05 DIAGNOSIS — M7989 Other specified soft tissue disorders: Secondary | ICD-10-CM | POA: Insufficient documentation

## 2016-07-05 NOTE — Telephone Encounter (Signed)
The patient is in severe pain, I need to determine if there is AVN versus simply an effusion, I have ordered an MRI and spoken to the [employer] staff, they will do it at 2:30 PM today. We can work on the approval on Monday.if there is a large effusion I can drain it this weekend for rapid relief.

## 2016-07-07 ENCOUNTER — Ambulatory Visit (INDEPENDENT_AMBULATORY_CARE_PROVIDER_SITE_OTHER): Payer: Medicare Other | Admitting: Sports Medicine

## 2016-07-07 DIAGNOSIS — F4321 Adjustment disorder with depressed mood: Secondary | ICD-10-CM

## 2016-07-07 DIAGNOSIS — M1611 Unilateral primary osteoarthritis, right hip: Secondary | ICD-10-CM | POA: Diagnosis not present

## 2016-07-07 DIAGNOSIS — F4320 Adjustment disorder, unspecified: Secondary | ICD-10-CM | POA: Insufficient documentation

## 2016-07-07 MED ORDER — DULOXETINE HCL 30 MG PO CPEP: 30.0000 mg | ORAL_CAPSULE | Freq: Every day | ORAL | 3 refills | Status: DC

## 2016-07-07 MED ORDER — ALENDRONATE SODIUM 70 MG PO TABS: 70.0000 mg | ORAL_TABLET | ORAL | 11 refills | Status: DC

## 2016-07-07 MED ORDER — ALENDRONATE SODIUM 70 MG PO TABS
70.0000 mg | ORAL_TABLET | ORAL | 11 refills | Status: DC
Start: 2016-07-07 — End: 2016-07-07

## 2016-07-07 NOTE — Assessment & Plan Note (Signed)
Considering increased depressive symptoms, and pain in the hip we are going to add Cymbalta. Return in one month to check a PHQ9 and GAD7.

## 2016-07-07 NOTE — Assessment & Plan Note (Signed)
Intense pain after injection, approximately 3 weeks. This is not consistent with a steroid flare, I did obtain an MRI over the weekend that showed increased T2 edema in the femoral head and femoral neck as well as the acetabulum as well as a marked hip joint effusion. Differential does certainly include avascular necrosis. I did discuss this with the radiologist, and considering recent steroids for COPD exacerbation this could certainly represent early AVN. I aspirated his hip joint, this will be sent off for cultures, cell count and crystal analysis. He did feel significantly better after the aspiration. I would like him to be partial weightbearing with crutches and there are some studies indicating bisphosphonates delay collapse of the femoral head avascular gross is so we are going to start Fosamax.

## 2016-07-07 NOTE — Progress Notes (Signed)
Subjective:    CC: Recheck hip  HPI: Marcus Orr returns, after a burst of steroids for a COPD exacerbation he developed pain in his right hip. X-ray showed asymmetric joint space narrowing right worse than left, he failed oral medications and so we proceeded with a hip joint injection, this improved his symptoms just for a week or 2, and he then started to have recurrence of pain in the right hip to the point where he had difficulty bearing weight. I set him up with an MRI this week and the results of which will be dictated below. No fevers or chills, pain is moderate, persistent.  Hemorrhagic stroke: Was out of the country on a trip to [place], he admits to not taking his blood pressure medication, unfortunately blood pressure went high, and he developed a degree of aphasia and unsteadiness. He was seen in the emergency department where CT and MRI showed evidence of a hemorrhagic stroke in the basal ganglia. He has improved some of his speech but still is slightly slurred. Understands that he needs to take his blood pressure medication regularly, and that he should avoid blood thinners and NSAIDs for now.  Past medical history:  Negative.  See flowsheet/record as well for more information.  Surgical history: Negative.  See flowsheet/record as well for more information.  Family history: Negative.  See flowsheet/record as well for more information.  Social history: Negative.  See flowsheet/record as well for more information.  Allergies, and medications have been entered into the medical record, reviewed, and no changes needed.   Review of Systems: No fevers, chills, night sweats, weight loss, chest pain, or shortness of breath.   Objective:    General: Well Developed, well nourished, and in no acute distress.  Neuro: Alert and oriented x3, extra-ocular muscles intact, sensation grossly intact.  HEENT: Normocephalic, atraumatic, pupils equal round reactive to light, neck supple, no masses, no  lymphadenopathy, thyroid nonpalpable.  Skin: Warm and dry, no rashes. Cardiac: Regular rate and rhythm, no murmurs rubs or gallops, no lower extremity edema.  Respiratory: Clear to auscultation bilaterally. Not using accessory muscles, speaking in full sentences.  Procedure: Real-time Ultrasound Guided aspiration/injection of right hip joint Device: GE Logiq E  Verbal informed consent obtained.  Time-out conducted.  Noted no overlying erythema, induration, or other signs of local infection.  Skin prepped in a sterile fashion.  Local anesthesia: Topical Ethyl chloride.  With sterile technique and under real time ultrasound guidance:  Aspirated approximately 12 mL straw-colored fluid from his left hip joint, I injected 1-1/2 mL lidocaine, 1-1/2 mL Bupivicaine. Completed without difficulty  Pain immediately resolved suggesting accurate placement of the medication.  Advised to call if fevers/chills, erythema, induration, drainage, or persistent bleeding.  Images permanently stored and available for review in the ultrasound unit.  Impression: Technically successful ultrasound guided injection.  Impression and Recommendations:    Primary osteoarthritis of right hip Intense pain after injection, approximately 3 weeks. This is not consistent with a steroid flare, I did obtain an MRI over the weekend that showed increased T2 edema in the femoral head and femoral neck as well as the acetabulum as well as a marked hip joint effusion. Differential does certainly include avascular necrosis. I did discuss this with the radiologist, and considering recent steroids for COPD exacerbation this could certainly represent early AVN. I aspirated his hip joint, this will be sent off for cultures, cell count and crystal analysis. He did feel significantly better after the aspiration. I would like  him to be partial weightbearing with crutches and there are some studies indicating bisphosphonates delay collapse of  the femoral head avascular gross is so we are going to start Fosamax.  Adjustment disorder Considering increased depressive symptoms, and pain in the hip we are going to add Cymbalta. Return in one month to check a PHQ9 and GAD7.

## 2016-07-08 LAB — SYNOVIAL CELL COUNT + DIFF, W/ CRYSTALS
Basophils, %: 0 %
Eosinophils-Synovial: 1 % (ref 0–2)
Lymphocytes-Synovial Fld: 26 % (ref 0–74)
Monocyte/Macrophage: 52 % (ref 0–69)
Neutrophil, Synovial: 21 % (ref 0–24)
Synoviocytes, %: 0 % (ref 0–15)
WBC, Synovial: 115 {cells}/uL (ref <150)

## 2016-07-12 ENCOUNTER — Other Ambulatory Visit (HOSPITAL_BASED_OUTPATIENT_CLINIC_OR_DEPARTMENT_OTHER): Payer: Medicare Other

## 2016-07-12 LAB — BODY FLUID CULTURE
Gram Stain: NONE SEEN
Gram Stain: NONE SEEN
Organism ID, Bacteria: NO GROWTH

## 2016-07-14 ENCOUNTER — Ambulatory Visit: Payer: PRIVATE HEALTH INSURANCE | Admitting: Sports Medicine

## 2016-07-25 ENCOUNTER — Ambulatory Visit (INDEPENDENT_AMBULATORY_CARE_PROVIDER_SITE_OTHER): Payer: Medicare Other | Admitting: Sports Medicine

## 2016-07-25 DIAGNOSIS — M1611 Unilateral primary osteoarthritis, right hip: Secondary | ICD-10-CM

## 2016-07-25 NOTE — Assessment & Plan Note (Signed)
Previously: This is not consistent with a steroid flare, I did obtain an MRI over the weekend that showed increased T2 edema in the femoral head and femoral neck as well as the acetabulum as well as a marked hip joint effusion. Differential does certainly include avascular necrosis. I did discuss this with the radiologist, and considering recent steroids for COPD exacerbation this could certainly represent early AVN. I aspirated his hip joint, this will be sent off for cultures, cell count and crystal analysis. He did feel significantly better after the aspiration. I would like him to be partial weightbearing with crutches and there are some studies indicating bisphosphonates delay collapse of the femoral head avascular gross is so we are going to start Fosamax.  Today: Near fall in the parking lot, right knee weakness, likely due to disuse and remaining nonweightbearing on crutches. At this point because he is homebound, and with only minimal weightbearing on the right leg I would like him to get home health physical therapy. Keep existing follow-ups.

## 2016-07-25 NOTE — Progress Notes (Signed)
  Subjective:    CC: Near fall  HPI: Marcus Orr returns, we've been treating him from what appears to be avascular necrosis of his right hip. He has been minimal to nonweightbearing with crutches, overall his hip has improved considerably to the point where he is nearly pain-free. Unfortunately while walking in today he nearly fell, he describes weakness and giving away sensation of his right knee which is post arthroplasty. Otherwise is doing well.  Hypertension: Well controlled now that he's taking his blood pressure medication again.    Past medical history:  Negative.  See flowsheet/record as well for more information.  Surgical history: Negative.  See flowsheet/record as well for more information.  Family history: Negative.  See flowsheet/record as well for more information.  Social history: Negative.  See flowsheet/record as well for more information.  Allergies, and medications have been entered into the medical record, reviewed, and no changes needed.   Review of Systems: No fevers, chills, night sweats, weight loss, chest pain, or shortness of breath.   Objective:    General: Well Developed, well nourished, and in no acute distress.  Neuro: Alert and oriented x3, extra-ocular muscles intact, sensation grossly intact.  HEENT: Normocephalic, atraumatic, pupils equal round reactive to light, neck supple, no masses, no lymphadenopathy, thyroid nonpalpable.  Skin: Warm and dry, no rashes. Cardiac: Regular rate and rhythm, no murmurs rubs or gallops, no lower extremity edema.  Respiratory: Clear to auscultation bilaterally. Not using accessory muscles, speaking in full sentences. Right Knee: Normal to inspection with no erythema or effusion or obvious bony abnormalities, well-healed arthroplasty scar. Palpation normal with no warmth or joint line tenderness or patellar tenderness or condyle tenderness. ROM normal in flexion and extension and lower leg rotation. Ligaments with solid  consistent endpoints including ACL, PCL, LCL, MCL. Negative Mcmurray's and provocative meniscal tests. Non painful patellar compression. Patellar and quadriceps tendons unremarkable. Hamstring and quadriceps strength is normal.  Impression and Recommendations:    Primary osteoarthritis of right hip Previously: This is not consistent with a steroid flare, I did obtain an MRI over the weekend that showed increased T2 edema in the femoral head and femoral neck as well as the acetabulum as well as a marked hip joint effusion. Differential does certainly include avascular necrosis. I did discuss this with the radiologist, and considering recent steroids for COPD exacerbation this could certainly represent early AVN. I aspirated his hip joint, this will be sent off for cultures, cell count and crystal analysis. He did feel significantly better after the aspiration. I would like him to be partial weightbearing with crutches and there are some studies indicating bisphosphonates delay collapse of the femoral head avascular gross is so we are going to start Fosamax.  Today: Near fall in the parking lot, right knee weakness, likely due to disuse and remaining nonweightbearing on crutches. At this point because he is homebound, and with only minimal weightbearing on the right leg I would like him to get home health physical therapy. Keep existing follow-ups.  I spent 25 minutes with this patient, greater than 50% was face-to-face time counseling regarding the above diagnoses

## 2016-07-31 ENCOUNTER — Other Ambulatory Visit: Payer: Self-pay | Admitting: Sports Medicine

## 2016-07-31 DIAGNOSIS — M1611 Unilateral primary osteoarthritis, right hip: Secondary | ICD-10-CM

## 2016-07-31 NOTE — Progress Notes (Signed)
Replaced expired order, Pt is now ready to begin home health.

## 2016-08-01 DIAGNOSIS — K219 Gastro-esophageal reflux disease without esophagitis: Secondary | ICD-10-CM | POA: Diagnosis not present

## 2016-08-01 DIAGNOSIS — E785 Hyperlipidemia, unspecified: Secondary | ICD-10-CM | POA: Diagnosis not present

## 2016-08-01 DIAGNOSIS — D500 Iron deficiency anemia secondary to blood loss (chronic): Secondary | ICD-10-CM | POA: Diagnosis not present

## 2016-08-01 DIAGNOSIS — I10 Essential (primary) hypertension: Secondary | ICD-10-CM | POA: Diagnosis not present

## 2016-08-01 DIAGNOSIS — M1611 Unilateral primary osteoarthritis, right hip: Secondary | ICD-10-CM | POA: Diagnosis not present

## 2016-08-01 DIAGNOSIS — M6281 Muscle weakness (generalized): Secondary | ICD-10-CM | POA: Diagnosis not present

## 2016-08-04 ENCOUNTER — Ambulatory Visit (INDEPENDENT_AMBULATORY_CARE_PROVIDER_SITE_OTHER): Payer: Medicare Other | Admitting: Sports Medicine

## 2016-08-04 DIAGNOSIS — F4321 Adjustment disorder with depressed mood: Secondary | ICD-10-CM

## 2016-08-04 DIAGNOSIS — M1611 Unilateral primary osteoarthritis, right hip: Secondary | ICD-10-CM

## 2016-08-04 NOTE — Assessment & Plan Note (Signed)
Continued suspect femoral head avascular necrosis, continue nonweightbearing with Fosamax. Pain has improved 80% with nonweightbearing. I do suspect 3-4 months of this before allowing him full weightbearing.

## 2016-08-04 NOTE — Assessment & Plan Note (Signed)
Overall doing well, discontinue Cymbalta.

## 2016-08-04 NOTE — Progress Notes (Signed)
  Subjective:    CC: Follow-up  HPI: Right hip pain: MRI was suspicious for avascular necrosis. He has improved 80% over the past month with nonweightbearing on the affected side as well as Fosamax.  Adjustment disorder: He really never took his Cymbalta, the issue was with his mother-in-law living with them and needing 24 hour care. She is now in a nursing home and things got significantly better for them.  Past medical history:  Negative.  See flowsheet/record as well for more information.  Surgical history: Negative.  See flowsheet/record as well for more information.  Family history: Negative.  See flowsheet/record as well for more information.  Social history: Negative.  See flowsheet/record as well for more information.  Allergies, and medications have been entered into the medical record, reviewed, and no changes needed.   Review of Systems: No fevers, chills, night sweats, weight loss, chest pain, or shortness of breath.   Objective:    General: Well Developed, well nourished, and in no acute distress.  Neuro: Alert and oriented x3, extra-ocular muscles intact, sensation grossly intact.  HEENT: Normocephalic, atraumatic, pupils equal round reactive to light, neck supple, no masses, no lymphadenopathy, thyroid nonpalpable.  Skin: Warm and dry, no rashes. Cardiac: Regular rate and rhythm, no murmurs rubs or gallops, no lower extremity edema.  Respiratory: Clear to auscultation bilaterally. Not using accessory muscles, speaking in full sentences.  Impression and Recommendations:    Adjustment disorder Overall doing well, discontinue Cymbalta.  Primary osteoarthritis of right hip Continued suspect femoral head avascular necrosis, continue nonweightbearing with Fosamax. Pain has improved 80% with nonweightbearing. I do suspect 3-4 months of this before allowing him full weightbearing.  I spent 25 minutes with this patient, greater than 50% was face-to-face time counseling  regarding the above diagnoses

## 2016-08-06 DIAGNOSIS — D500 Iron deficiency anemia secondary to blood loss (chronic): Secondary | ICD-10-CM | POA: Diagnosis not present

## 2016-08-06 DIAGNOSIS — E785 Hyperlipidemia, unspecified: Secondary | ICD-10-CM | POA: Diagnosis not present

## 2016-08-06 DIAGNOSIS — K219 Gastro-esophageal reflux disease without esophagitis: Secondary | ICD-10-CM | POA: Diagnosis not present

## 2016-08-06 DIAGNOSIS — M1611 Unilateral primary osteoarthritis, right hip: Secondary | ICD-10-CM | POA: Diagnosis not present

## 2016-08-06 DIAGNOSIS — I10 Essential (primary) hypertension: Secondary | ICD-10-CM | POA: Diagnosis not present

## 2016-08-06 DIAGNOSIS — M6281 Muscle weakness (generalized): Secondary | ICD-10-CM | POA: Diagnosis not present

## 2016-08-08 DIAGNOSIS — M1611 Unilateral primary osteoarthritis, right hip: Secondary | ICD-10-CM | POA: Diagnosis not present

## 2016-08-08 DIAGNOSIS — I10 Essential (primary) hypertension: Secondary | ICD-10-CM | POA: Diagnosis not present

## 2016-08-08 DIAGNOSIS — D500 Iron deficiency anemia secondary to blood loss (chronic): Secondary | ICD-10-CM | POA: Diagnosis not present

## 2016-08-08 DIAGNOSIS — M6281 Muscle weakness (generalized): Secondary | ICD-10-CM | POA: Diagnosis not present

## 2016-08-08 DIAGNOSIS — K219 Gastro-esophageal reflux disease without esophagitis: Secondary | ICD-10-CM | POA: Diagnosis not present

## 2016-08-08 DIAGNOSIS — E785 Hyperlipidemia, unspecified: Secondary | ICD-10-CM | POA: Diagnosis not present

## 2016-08-13 DIAGNOSIS — E785 Hyperlipidemia, unspecified: Secondary | ICD-10-CM | POA: Diagnosis not present

## 2016-08-13 DIAGNOSIS — D500 Iron deficiency anemia secondary to blood loss (chronic): Secondary | ICD-10-CM | POA: Diagnosis not present

## 2016-08-13 DIAGNOSIS — I10 Essential (primary) hypertension: Secondary | ICD-10-CM | POA: Diagnosis not present

## 2016-08-13 DIAGNOSIS — M6281 Muscle weakness (generalized): Secondary | ICD-10-CM | POA: Diagnosis not present

## 2016-08-13 DIAGNOSIS — M1611 Unilateral primary osteoarthritis, right hip: Secondary | ICD-10-CM | POA: Diagnosis not present

## 2016-08-13 DIAGNOSIS — K219 Gastro-esophageal reflux disease without esophagitis: Secondary | ICD-10-CM | POA: Diagnosis not present

## 2016-08-15 DIAGNOSIS — D500 Iron deficiency anemia secondary to blood loss (chronic): Secondary | ICD-10-CM | POA: Diagnosis not present

## 2016-08-15 DIAGNOSIS — I10 Essential (primary) hypertension: Secondary | ICD-10-CM | POA: Diagnosis not present

## 2016-08-15 DIAGNOSIS — K219 Gastro-esophageal reflux disease without esophagitis: Secondary | ICD-10-CM | POA: Diagnosis not present

## 2016-08-15 DIAGNOSIS — E785 Hyperlipidemia, unspecified: Secondary | ICD-10-CM | POA: Diagnosis not present

## 2016-08-15 DIAGNOSIS — M6281 Muscle weakness (generalized): Secondary | ICD-10-CM | POA: Diagnosis not present

## 2016-08-15 DIAGNOSIS — M1611 Unilateral primary osteoarthritis, right hip: Secondary | ICD-10-CM | POA: Diagnosis not present

## 2016-08-19 DIAGNOSIS — M1611 Unilateral primary osteoarthritis, right hip: Secondary | ICD-10-CM | POA: Diagnosis not present

## 2016-08-19 DIAGNOSIS — D500 Iron deficiency anemia secondary to blood loss (chronic): Secondary | ICD-10-CM | POA: Diagnosis not present

## 2016-08-19 DIAGNOSIS — M6281 Muscle weakness (generalized): Secondary | ICD-10-CM | POA: Diagnosis not present

## 2016-08-19 DIAGNOSIS — E785 Hyperlipidemia, unspecified: Secondary | ICD-10-CM | POA: Diagnosis not present

## 2016-08-19 DIAGNOSIS — K219 Gastro-esophageal reflux disease without esophagitis: Secondary | ICD-10-CM | POA: Diagnosis not present

## 2016-08-19 DIAGNOSIS — I10 Essential (primary) hypertension: Secondary | ICD-10-CM | POA: Diagnosis not present

## 2016-08-21 DIAGNOSIS — M6281 Muscle weakness (generalized): Secondary | ICD-10-CM | POA: Diagnosis not present

## 2016-08-21 DIAGNOSIS — I10 Essential (primary) hypertension: Secondary | ICD-10-CM | POA: Diagnosis not present

## 2016-08-21 DIAGNOSIS — E785 Hyperlipidemia, unspecified: Secondary | ICD-10-CM | POA: Diagnosis not present

## 2016-08-21 DIAGNOSIS — D500 Iron deficiency anemia secondary to blood loss (chronic): Secondary | ICD-10-CM | POA: Diagnosis not present

## 2016-08-21 DIAGNOSIS — M1611 Unilateral primary osteoarthritis, right hip: Secondary | ICD-10-CM | POA: Diagnosis not present

## 2016-08-21 DIAGNOSIS — K219 Gastro-esophageal reflux disease without esophagitis: Secondary | ICD-10-CM | POA: Diagnosis not present

## 2016-09-01 ENCOUNTER — Ambulatory Visit (INDEPENDENT_AMBULATORY_CARE_PROVIDER_SITE_OTHER): Payer: Medicare Other | Admitting: Sports Medicine

## 2016-09-01 DIAGNOSIS — M1611 Unilateral primary osteoarthritis, right hip: Secondary | ICD-10-CM | POA: Diagnosis not present

## 2016-09-01 DIAGNOSIS — C182 Malignant neoplasm of ascending colon: Secondary | ICD-10-CM

## 2016-09-01 NOTE — Progress Notes (Signed)
  Subjective:    CC: Follow-up  HPI: Left hip pain: MRI showed significant increased T2 signal in the femoral head concerning for early avascular necrosis, he also had some hip joint osteoarthritis. I made him nonweightbearing, he's done well and notes good pain relief. He does have a trip coming up to [place] in November and is curious as to whether an arthroplasty should be considered now.  Constipation: Chronic idiopathic, stools once or twice per week at the most, does pretty well with MiraLAX.  Past medical history:  Negative.  See flowsheet/record as well for more information.  Surgical history: Negative.  See flowsheet/record as well for more information.  Family history: Negative.  See flowsheet/record as well for more information.  Social history: Negative.  See flowsheet/record as well for more information.  Allergies, and medications have been entered into the medical record, reviewed, and no changes needed.   Review of Systems: No fevers, chills, night sweats, weight loss, chest pain, or shortness of breath.   Objective:    General: Well Developed, well nourished, and in no acute distress.  Neuro: Alert and oriented x3, extra-ocular muscles intact, sensation grossly intact.  HEENT: Normocephalic, atraumatic, pupils equal round reactive to light, neck supple, no masses, no lymphadenopathy, thyroid nonpalpable.  Skin: Warm and dry, no rashes. Cardiac: Regular rate and rhythm, no murmurs rubs or gallops, no lower extremity edema.  Respiratory: Clear to auscultation bilaterally. Not using accessory muscles, speaking in full sentences.  Impression and Recommendations:    Primary osteoarthritis of right hip Continue despite early femoral head avascular necrosis, increased T2 signal on MRI. He does also have some osteoarthritis. His pain is improved significantly with nonweightbearing over the past 2 months as well as Fosamax. I would like him to get a second opinion from  orthopedic surgery regarding the utility of hip arthroplasty, considering he does have a trip planned to [place] in November. Referral to Dr. Berenice Primas.  Colon cancer [facility]) Doing well post colectomy, he will touch base with gastroenterology regarding surveillance colonoscopy.

## 2016-09-01 NOTE — Assessment & Plan Note (Signed)
Continue despite early femoral head avascular necrosis, increased T2 signal on MRI. He does also have some osteoarthritis. His pain is improved significantly with nonweightbearing over the past 2 months as well as Fosamax. I would like him to get a second opinion from orthopedic surgery regarding the utility of hip arthroplasty, considering he does have a trip planned to [place] in November. Referral to Dr. Berenice Primas.

## 2016-09-01 NOTE — Assessment & Plan Note (Signed)
Doing well post colectomy, he will touch base with gastroenterology regarding surveillance colonoscopy.

## 2016-09-04 DIAGNOSIS — D500 Iron deficiency anemia secondary to blood loss (chronic): Secondary | ICD-10-CM | POA: Diagnosis not present

## 2016-09-04 DIAGNOSIS — I10 Essential (primary) hypertension: Secondary | ICD-10-CM | POA: Diagnosis not present

## 2016-09-04 DIAGNOSIS — M1611 Unilateral primary osteoarthritis, right hip: Secondary | ICD-10-CM | POA: Diagnosis not present

## 2016-09-04 DIAGNOSIS — M6281 Muscle weakness (generalized): Secondary | ICD-10-CM | POA: Diagnosis not present

## 2016-09-04 DIAGNOSIS — K219 Gastro-esophageal reflux disease without esophagitis: Secondary | ICD-10-CM | POA: Diagnosis not present

## 2016-09-04 DIAGNOSIS — E785 Hyperlipidemia, unspecified: Secondary | ICD-10-CM | POA: Diagnosis not present

## 2016-09-12 ENCOUNTER — Other Ambulatory Visit: Payer: Self-pay

## 2016-09-12 DIAGNOSIS — N401 Enlarged prostate with lower urinary tract symptoms: Secondary | ICD-10-CM

## 2016-09-12 MED ORDER — FINASTERIDE 5 MG PO TABS: 5.0000 mg | ORAL_TABLET | Freq: Every day | ORAL | 3 refills | Status: DC

## 2016-09-16 DIAGNOSIS — M1611 Unilateral primary osteoarthritis, right hip: Secondary | ICD-10-CM | POA: Diagnosis not present

## 2016-09-18 ENCOUNTER — Telehealth: Payer: Self-pay | Admitting: Sports Medicine

## 2016-09-18 NOTE — Telephone Encounter (Signed)
Patient scheduled for hip arthroplasty, anterior approach. This is intermediate risk noncardiac surgery. He had cardiac clearance from his cardiologist back in July of last year, typically controlled hypertension, normal left ventricular function on echocardiogram. He has greater than 4 metabolic equivalents of exercise tolerance. The only change is that he had a recent hemorrhagic stroke due to a hypertensive emergency. I would recommend avoiding heparin/lovenox unless absolute necessary perioperatively, and use only aspirin, early ambulation, and SCDs for DVT prevention. Otherwise he is cleared for hip arthroplasty.

## 2016-09-23 ENCOUNTER — Other Ambulatory Visit: Payer: Self-pay | Admitting: Orthopedic Surgery

## 2016-09-29 ENCOUNTER — Encounter (HOSPITAL_COMMUNITY)
Admission: RE | Admit: 2016-09-29 | Discharge: 2016-09-29 | Disposition: A | Discharge status: Home / Self Care | Payer: Medicare Other | Source: Ambulatory Visit | Attending: Orthopedic Surgery | Admitting: Orthopedic Surgery

## 2016-09-29 ENCOUNTER — Encounter (HOSPITAL_COMMUNITY): Payer: Self-pay

## 2016-09-29 DIAGNOSIS — Z01810 Encounter for preprocedural cardiovascular examination: Secondary | ICD-10-CM | POA: Insufficient documentation

## 2016-09-29 DIAGNOSIS — Z01812 Encounter for preprocedural laboratory examination: Secondary | ICD-10-CM | POA: Diagnosis not present

## 2016-09-29 DIAGNOSIS — M1611 Unilateral primary osteoarthritis, right hip: Secondary | ICD-10-CM | POA: Insufficient documentation

## 2016-09-29 HISTORY — DX: Unspecified asthma, uncomplicated: J45.909

## 2016-09-29 HISTORY — DX: Nontraumatic intracerebral hemorrhage in hemisphere, subcortical: I61.0

## 2016-09-29 HISTORY — DX: Nonrheumatic aortic valve disorder, unspecified: I35.9

## 2016-09-29 HISTORY — DX: Unspecified malignant neoplasm of skin, unspecified: C44.90

## 2016-09-29 HISTORY — DX: Nonrheumatic aortic (valve) stenosis: I35.0

## 2016-09-29 HISTORY — DX: Hypothyroidism, unspecified: E03.9

## 2016-09-29 LAB — PROTIME-INR
INR: 1.1
Prothrombin Time: 14.2 seconds (ref 11.4–15.2)

## 2016-09-29 LAB — TYPE AND SCREEN
ABO/RH(D): A POS
ANTIBODY SCREEN: NEGATIVE

## 2016-09-29 LAB — COMPREHENSIVE METABOLIC PANEL
ALT: 13 U/L — ABNORMAL LOW (ref 17–63)
AST: 20 U/L (ref 15–41)
Albumin: 3.8 g/dL (ref 3.5–5.0)
Alkaline Phosphatase: 64 U/L (ref 38–126)
Anion gap: 7 (ref 5–15)
BUN: 23 mg/dL — ABNORMAL HIGH (ref 6–20)
CHLORIDE: 107 mmol/L (ref 101–111)
CO2: 25 mmol/L (ref 22–32)
CREATININE: 1.51 mg/dL — AB (ref 0.61–1.24)
Calcium: 8.7 mg/dL — ABNORMAL LOW (ref 8.9–10.3)
GFR calc non Af Amer: 42 mL/min — ABNORMAL LOW (ref >60)
GFR, EST AFRICAN AMERICAN: 49 mL/min — AB (ref >60)
Glucose, Bld: 93 mg/dL (ref 65–99)
POTASSIUM: 4 mmol/L (ref 3.5–5.1)
Sodium: 139 mmol/L (ref 135–145)
Total Bilirubin: 0.5 mg/dL (ref 0.3–1.2)
Total Protein: 6.4 g/dL — ABNORMAL LOW (ref 6.5–8.1)

## 2016-09-29 LAB — URINALYSIS, ROUTINE W REFLEX MICROSCOPIC
Bilirubin Urine: NEGATIVE
GLUCOSE, UA: NEGATIVE mg/dL
Hgb urine dipstick: NEGATIVE
KETONES UR: NEGATIVE mg/dL
LEUKOCYTES UA: NEGATIVE
Nitrite: NEGATIVE
PH: 5 (ref 5.0–8.0)
Protein, ur: NEGATIVE mg/dL
SPECIFIC GRAVITY, URINE: 1.024 (ref 1.005–1.030)

## 2016-09-29 LAB — CBC WITH DIFFERENTIAL/PLATELET
Basophils Absolute: 0.1 10*3/uL (ref 0.0–0.1)
Basophils Relative: 1 %
Eosinophils Absolute: 0.3 10*3/uL (ref 0.0–0.7)
Eosinophils Relative: 5 %
HCT: 35 % — ABNORMAL LOW (ref 39.0–52.0)
Hemoglobin: 11.5 g/dL — ABNORMAL LOW (ref 13.0–17.0)
LYMPHS ABS: 1.5 10*3/uL (ref 0.7–4.0)
LYMPHS PCT: 22 %
MCH: 28.3 pg (ref 26.0–34.0)
MCHC: 32.9 g/dL (ref 30.0–36.0)
MCV: 86 fL (ref 78.0–100.0)
MONO ABS: 0.6 10*3/uL (ref 0.1–1.0)
Monocytes Relative: 9 %
Neutro Abs: 4.2 10*3/uL (ref 1.7–7.7)
Neutrophils Relative %: 63 %
PLATELETS: 327 10*3/uL (ref 150–400)
RBC: 4.07 MIL/uL — ABNORMAL LOW (ref 4.22–5.81)
RDW: 15.7 % — AB (ref 11.5–15.5)
WBC: 6.8 10*3/uL (ref 4.0–10.5)

## 2016-09-29 LAB — APTT: aPTT: 32 seconds (ref 24–36)

## 2016-09-29 LAB — SURGICAL PCR SCREEN
MRSA, PCR: NEGATIVE
Staphylococcus aureus: POSITIVE — AB

## 2016-09-29 LAB — ABO/RH: ABO/RH(D): A POS

## 2016-09-29 NOTE — Progress Notes (Signed)
Will need nasal betadine DOS

## 2016-09-29 NOTE — Pre-Procedure Instructions (Signed)
    Marcus Hibberd Sr.  09/29/2016      [facility] - [place], [place] [address] Phone: [phone] Fax: [phone]  [facility] [place], [place] [address] [place] [address] Phone: [phone] Fax: [phone]  [facility] [place], [place] - [address] [place] [address] [place] [address] Phone: [phone] Fax: [phone]  [place], [place] - [place] [address] Phone: [phone] Fax: [phone]    Your procedure is scheduled on 10/03/16.  Report to [facility] Admitting at 8 A.M.  Call this number if you have problems the morning of surgery:  [phone]   Remember:  Do not eat food or drink liquids after midnight.  Take these medicines the morning of surgery with A SIP OF WATER --tylenol,all inhalers,synthroid   Do not wear jewelry, make-up or nail polish.  Do not wear lotions, powders, or perfumes, or deoderant.  Do not shave 48 hours prior to surgery.  Men may shave face and neck.  Do not bring valuables to the hospital.  [facility] is not responsible for any belongings or valuables.  Contacts, dentures or bridgework may not be worn into surgery.  Leave your suitcase in the car.  After surgery it may be brought to your room.  For patients admitted to the hospital, discharge time will be determined by your treatment team.  Patients discharged the day of surgery will not be allowed to drive home.   Name and phone number of your driver:    Special instructions:  Do not take any aspirin,anti-inflammatories,vitamins,or herbal supplements 5-7 days prior to surgery.  Please read over the following fact sheets that you were given. MRSA Information

## 2016-09-30 ENCOUNTER — Encounter (HOSPITAL_COMMUNITY): Payer: Self-pay

## 2016-09-30 NOTE — Progress Notes (Addendum)
Anesthesia Chart Review: Patient is a 83 year old male scheduled for right anterior THA on 10/03/16 by Dr. Berenice Primas.  History includes never smoker, mild AS (09/2015), HTN with admission 06/17/16 for HTN emergency (BP 202/95) with ICH (2.2 X 1 cm left basal ganglia intraparenchymal hemorrhage), HLD, iron deficiency anemia, GI bleed 10/02/15, colon cancer (adenocarcinoma) s/p robotic ileocolectomy 12/12/15 ([place]), skin cancer, asthma, hypothyroidism, bilateral TKA. Labs show evidence of CKD stage III (Cr ranging 1.4-1.76 since 2014).   - PCP is Dr. Aundria Mems. He saw patient on 09/18/16 for surgical clearance. "This is intermediate risk noncardiac surgery. He had cardiac clearance from his cardiologist back in July of last year, typically controlled hypertension, normal left ventricular function on echocardiogram. He has greater than 4 metabolic equivalents of exercise tolerance. The only change is that he had a recent hemorrhagic stroke due to a hypertensive emergency. I would recommend avoiding heparin/lovenox unless absolute necessary perioperatively, and use only aspirin, early ambulation, and SCDs for DVT prevention. Otherwise he is cleared for hip arthroplasty." - Cardiologist is Dr. Benjie Karvonen with [facility], last visit 11/01/15 for clearance prior to colon resection.  - Pulmonologist is with [facility], last visti 11/07/15 with Liana Gerold, PA-C. He has mild persistent asthma and history of pulmonary infiltrates consistent with a chronic atypical PNA such as MAC. There was discussion of doing bronchoscopy in the future (but I don't see that this was done), but he was cleared for colon resection at that time. (He had previously been seen by Dr. Oletta Cohn at [facility] on 12/30/12 for "indolent MAC infection that is more of a colonization than actual infection" and given PRN pulmonology follow-up unless he developed symptoms consistent with MAC infection.  Scanned under Media tab.) - Last HEM-ONC visit seen was with [facility] Dr. Burney Gauze on 10/10/15. He was seen by Dr. Lindon Romp at the [facility] on 02/26/16. He was going to be referred to a Geologist, engineering and Genetics. He had a Dietitian visit at [facility] on 01/29/16. - Neurologist is with [facility], last visit with Chinita Greenland, NP on 06/25/16.   Meds include albuterol, amlodipine, aspirin 81 mg, Symbicort, Cosopt ophthalmic, Cymbalta (not taking), finasteride, Flonase, HCTZ (not taking), levothyroxine, omeprazole, Zocor, Hytrin.  BP (!) 154/81   Pulse 62   Temp 36.8 C   Resp 18   Ht 5' 10"  (1.778 m)   Wt 179 lb 1.6 oz (81.2 kg)   SpO2 97%   BMI 25.70 kg/m    EKG 09/29/16: SB at 57 bpm.   Echo (with saline contrast injection) 06/18/16 ([facility]; Care Everywhere): Interpretation Summary The left ventricle is normal in size, wall thickness and wall motion with ejection fraction of 60-65%. Grade I mild diastolic dysfunction; abnormal relaxation pattern. Injection of contrast documented no interatrial shunt. The aortic valve leaflets are sclerotic and calcified and motion is restricted. There is mild aortic stenosis. (I did not note AV gradient/velocity measurements on 06/18/16 echo report. He also had mild AS on 10/10/15 echo. Measurements then were: VTI ratio of LVOT to aortic valve: 0.58. Valve area (VTI): 1.81 cm^2. Indexed valve area (VTI): 0.85 cm^2/m^2. Peak velocity ratio of LVOT to aortic valve: 0.52. Indexed valve area (Vmax): 0.76 cm^2/m^2. Mean velocity ratio of LVOT to aortic valve: 0.42. Indexed valve area (Vmean): 0.62 cm^2/m^2.  Mean gradient (S): 13 mm Hg. Peak gradient (S): 17 mm Hg.)  According to Dr. Delanna Ahmadi 11/01/15 office  note, "There had been a Cardiolite study in 2012 which was negative.) This was done by Dr. Joline Salt.  PCXR 06/18/16 ([place]; Care Everywhere): FINDINGS:  LUNGS/PLEURA: No pneumothorax. Clear  lungs without focal opacity. HEART/MEDIASTINUM: No change in the cardiac silhouette.  IMPRESSION: No acute process.  CT chest/abd/pelvis 10/25/15 ([facility]; Care Everywhere): IMPRESSION: 1.3.1 cm cecal mass. Nonpathologically enlarged nonspecific small lymph nodes in the right lower quadrant mesentery. 2. Multiple low-density lesions in liver appear stable and are consistent with cysts. 3. Progression of multifocal bilateral reticulonodular/tree-in-bud interstitial disease throughout the lungs likely representing chronic atypical infection such as MAC.  Preoperative labs noted. BUN 23, Cr 1.51, which appears within patient's baseline. Glucose 93. AST 20, ALT 13. H/H 11.5/35.0. T&S done. PT/PTT WNL.  Discussed above with anesthesiologist Dr. Jenita Seashore. Patient has had recent PCP evaluation with preoperative risk assessment. ICH was > 3 months ago. Renal function appears stable. Echocardiogram surveillance for AS appears up to date. He does however, have known chest CT findings suggestive of chronic atypical infection that appeared progressive last year. There was talk of bronchoscopy following recovery from colon surgery, but I don't see that this was done. If patient without acute pulmonary symptoms then would anticipate that he could proceed but would recommend a preoperative CXR, that he remain compliant with any pulmonology medications, and use albuterol inhaler prior to surgery. I have left a voice message for patient to contact me to review.   George Hugh [facility]/Anesthesiology Phone [phone] 09/30/2016 4:47 PM  Addendum: I spoke with patient regarding above recommendations. Patient is agreeable. He reports that he is doing well from a pulmonary standpoint. He denied SOB, DOE, cough. He denied any respiratory issues perioperatively with colon surgery last year. If no acute changes then I anticipate that he can proceed as planned. I did advise that he  schedule pulmonology follow-up in the future or at least talk with Dr. Dianah Field regarding long term pulmonology follow-up recommendations.  Myra Gianotti, PA-C [facility] Stay [facility]/Anesthesiology Phone [phone] 10/01/2016 2:00 PM

## 2016-10-02 MED ORDER — CEFAZOLIN SODIUM-DEXTROSE 2-4 GM/100ML-% IV SOLN
2.0000 g | INTRAVENOUS | Status: AC
  Administered 2016-10-03: 2 g via INTRAVENOUS
  Filled 2016-10-02: qty 100

## 2016-10-02 NOTE — H&P (Addendum)
TOTAL HIP ADMISSION H&P  Patient is admitted for right total hip arthroplasty.  Subjective:  Chief Complaint: right hip pain  HPI: Marcus Brick Sr., 83 y.o. male, has a history of pain and functional disability in the right hip(s) due to arthritis and patient has failed non-surgical conservative treatments for greater than 12 weeks to include NSAID's and/or analgesics, corticosteriod injections, flexibility and strengthening excercises, supervised PT with diminished ADL's post treatment and activity modification.  Onset of symptoms was gradual starting 5 years ago with gradually worsening course since that time.The patient noted no past surgery on the right hip(s).  Patient currently rates pain in the right hip at 9 out of 10 with activity. Patient has night pain, worsening of pain with activity and weight bearing, trendelenberg gait, pain that interfers with activities of daily living, pain with passive range of motion and joint swelling. Patient has evidence of subchondral cysts, subchondral sclerosis, periarticular osteophytes and joint space narrowing by imaging studies. This condition presents safety issues increasing the risk of falls. This patient has had failure of all reasonable conservative care.  There is no current active infection.  Patient Active Problem List   Diagnosis Date Noted  . Adjustment disorder 07/07/2016  . Primary osteoarthritis of right hip 06/16/2016  . Asthmatic bronchitis 06/16/2016  . Colon cancer ([place]) 10/10/2015  . Iron deficiency anemia due to chronic blood loss 10/02/2015  . Lower GI bleed 10/02/2015  . Pulmonary nodules 10/02/2015  . History of bilateral knee arthroplasty 03/06/2015  . Actinic keratosis 01/13/2013  . Renal insufficiency 07/14/2012  . BPH (benign prostatic hyperplasia) 06/16/2012  . Essential hypertension, benign 06/16/2012  . Hyperlipidemia LDL goal <100 06/16/2012  . Hypothyroidism 06/16/2012  . Glaucoma 06/16/2012  .  Laryngopharyngeal reflux 06/16/2012  . Perennial allergic rhinitis 06/16/2012  . Annual physical exam 06/16/2012   Past Medical History:  Diagnosis Date  . Aortic stenosis    mild AS 09/2015 echo  . Aortic valve disease    mild  . Asthma    as child  . Basal ganglia hemorrhage (HCC)    left basal ganlia intraparenchymal hemoorhage 06/17/16 (in the setting of HTN emergency)  . Colon cancer ([place]) 10/10/2015  . Hyperlipidemia   . Hypertension   . Hypothyroidism   . Iron deficiency anemia due to chronic blood loss 10/02/2015  . Lower GI bleed 10/02/2015  . Skin cancer   . Thyroid disease     Past Surgical History:  Procedure Laterality Date  . COLON RESECTION     ca  . EYE SURGERY    . JOINT REPLACEMENT     bil  . REPLACEMENT TOTAL KNEE      No prescriptions prior to admission.   Allergies  Allergen Reactions  . No Known Allergies     Social History  Substance Use Topics  . Smoking status: Never Smoker  . Smokeless tobacco: Not on file  . Alcohol use No    No family history on file.   ROS ROS: I have reviewed the patient's review of systems thoroughly and there are no positive responses as relates to the HPI. Objective:  Physical Exam  Vital signs in last 24 hours:    Vitals:   10/03/16 0835  BP: (!) 170/90  Pulse: (!) 55  Resp: 18  Temp: 98 F (36.7 C)   Well-developed well-nourished patient in no acute distress. Alert and oriented x3 HEENT:within normal limits Cardiac: Regular rate and rhythm Pulmonary: Lungs clear to auscultation Abdomen:  Soft and nontender.  Normal active bowel sounds  Musculoskeletal: (Right hip: Limited internal rotation.  Pain with internal rotation.  Neurovascular intact distally.  Severe limits of motion throughout. Labs:  Recent Results (from the past 2160 hour(s))  Synovial cell count + diff, w/ crystals     Status: None   Collection Time: 07/07/16  6:00 PM  Result Value Ref Range   Site NA    Color, Synovial LIGHT YELLOW  STRAW/YELLOW   Appearance-Synovial CLEAR CLEAR/HAZY   WBC, Synovial 115 <150 cells/uL   Neutrophil, Synovial 21 0 - 24 %   Lymphocytes-Synovial Fld 26 0 - 74 %   Monocyte/Macrophage 52 0 - 69 %   Eosinophils-Synovial 1 0 - 2 %   Basophils, % 0 0 %   Synoviocytes, % 0 0 - 15 %   Crystals SEE NOTE NONE SEEN HPF    Comment: No crystals found  Body fluid culture     Status: None   Collection Time: 07/07/16  6:00 PM  Result Value Ref Range   Gram Stain No WBC Seen    Gram Stain No Organisms Seen    Organism ID, Bacteria NO GROWTH   Surgical pcr screen     Status: Abnormal   Collection Time: 09/29/16  3:20 PM  Result Value Ref Range   MRSA, PCR NEGATIVE NEGATIVE   Staphylococcus aureus POSITIVE (A) NEGATIVE    Comment:        The Xpert SA Assay (FDA approved for NASAL specimens in patients over 79 years of age), is one component of a comprehensive surveillance program.  Test performance has been validated by [facility] for patients greater than or equal to 63 year old. It is not intended to diagnose infection nor to guide or monitor treatment.   Urinalysis, Routine w reflex microscopic     Status: None   Collection Time: 09/29/16  3:20 PM  Result Value Ref Range   Color, Urine YELLOW YELLOW   APPearance CLEAR CLEAR   Specific Gravity, Urine 1.024 1.005 - 1.030   pH 5.0 5.0 - 8.0   Glucose, UA NEGATIVE NEGATIVE mg/dL   Hgb urine dipstick NEGATIVE NEGATIVE   Bilirubin Urine NEGATIVE NEGATIVE   Ketones, ur NEGATIVE NEGATIVE mg/dL   Protein, ur NEGATIVE NEGATIVE mg/dL   Nitrite NEGATIVE NEGATIVE   Leukocytes, UA NEGATIVE NEGATIVE  APTT     Status: None   Collection Time: 09/29/16  3:21 PM  Result Value Ref Range   aPTT 32 24 - 36 seconds  CBC WITH DIFFERENTIAL     Status: Abnormal   Collection Time: 09/29/16  3:21 PM  Result Value Ref Range   WBC 6.8 4.0 - 10.5 K/uL   RBC 4.07 (L) 4.22 - 5.81 MIL/uL   Hemoglobin 11.5 (L) 13.0 - 17.0 g/dL   HCT 35.0 (L) 39.0 - 52.0 %    MCV 86.0 78.0 - 100.0 fL   MCH 28.3 26.0 - 34.0 pg   MCHC 32.9 30.0 - 36.0 g/dL   RDW 15.7 (H) 11.5 - 15.5 %   Platelets 327 150 - 400 K/uL   Neutrophils Relative % 63 %   Neutro Abs 4.2 1.7 - 7.7 K/uL   Lymphocytes Relative 22 %   Lymphs Abs 1.5 0.7 - 4.0 K/uL   Monocytes Relative 9 %   Monocytes Absolute 0.6 0.1 - 1.0 K/uL   Eosinophils Relative 5 %   Eosinophils Absolute 0.3 0.0 - 0.7 K/uL   Basophils Relative 1 %  Basophils Absolute 0.1 0.0 - 0.1 K/uL  Comprehensive metabolic panel     Status: Abnormal   Collection Time: 09/29/16  3:21 PM  Result Value Ref Range   Sodium 139 135 - 145 mmol/L   Potassium 4.0 3.5 - 5.1 mmol/L   Chloride 107 101 - 111 mmol/L   CO2 25 22 - 32 mmol/L   Glucose, Bld 93 65 - 99 mg/dL   BUN 23 (H) 6 - 20 mg/dL   Creatinine, Ser 1.51 (H) 0.61 - 1.24 mg/dL   Calcium 8.7 (L) 8.9 - 10.3 mg/dL   Total Protein 6.4 (L) 6.5 - 8.1 g/dL   Albumin 3.8 3.5 - 5.0 g/dL   AST 20 15 - 41 U/L   ALT 13 (L) 17 - 63 U/L   Alkaline Phosphatase 64 38 - 126 U/L   Total Bilirubin 0.5 0.3 - 1.2 mg/dL   GFR calc non Af Amer 42 (L) >60 mL/min   GFR calc Af Amer 49 (L) >60 mL/min    Comment: (NOTE) The eGFR has been calculated using the CKD EPI equation. This calculation has not been validated in all clinical situations. eGFR's persistently <60 mL/min signify possible Chronic Kidney Disease.    Anion gap 7 5 - 15  Protime-INR     Status: None   Collection Time: 09/29/16  3:21 PM  Result Value Ref Range   Prothrombin Time 14.2 11.4 - 15.2 seconds   INR 1.10   Type and screen Order type and screen if day of surgery is less than 15 days from draw of preadmission visit or order morning of surgery if day of surgery is greater than 6 days from preadmission visit.     Status: None   Collection Time: 09/29/16  3:37 PM  Result Value Ref Range   ABO/RH(D) A POS    Antibody Screen NEG    Sample Expiration 10/13/2016    Extend sample reason NO TRANSFUSIONS OR PREGNANCY  IN THE PAST 3 MONTHS   ABO/Rh     Status: None   Collection Time: 09/29/16  3:37 PM  Result Value Ref Range   ABO/RH(D) A POS    Estimated body mass index is 25.7 kg/m as calculated from the following:   Height as of 09/29/16: _0  (1.778 m).   Weight as of 09/29/16: 81.2 kg (179 lb 1.6 oz).   Imaging Review Plain radiographs demonstrate severe degenerative joint disease of the right hip(s). The bone quality appears to be fair for age and reported activity level.  Assessment/Plan:  End stage arthritis, right hip(s)  The patient history, physical examination, clinical judgement of the provider and imaging studies are consistent with end stage degenerative joint disease of the right hip(s) and total hip arthroplasty is deemed medically necessary. The treatment options including medical management, injection therapy, arthroscopy and arthroplasty were discussed at length. The risks and benefits of total hip arthroplasty were presented and reviewed. The risks due to aseptic loosening, infection, stiffness, dislocation/subluxation,  thromboembolic complications and other imponderables were discussed.  The patient acknowledged the explanation, agreed to proceed with the plan and consent was signed. Patient is being admitted for inpatient treatment for surgery, pain control, PT, OT, prophylactic antibiotics, VTE prophylaxis, progressive ambulation and ADL's and discharge planning.The patient is planning to be discharged home with home health services

## 2016-10-03 ENCOUNTER — Inpatient Hospital Stay (HOSPITAL_COMMUNITY): Payer: Medicare Other

## 2016-10-03 ENCOUNTER — Inpatient Hospital Stay (HOSPITAL_COMMUNITY): Payer: Medicare Other | Admitting: Vascular Surgery

## 2016-10-03 ENCOUNTER — Inpatient Hospital Stay (HOSPITAL_COMMUNITY)
Admission: RE | Admit: 2016-10-03 | Discharge: 2016-10-05 | DRG: 470 | Disposition: A | Discharge status: Home / Self Care | Payer: Medicare Other | Source: Ambulatory Visit | Attending: Orthopedic Surgery | Admitting: Orthopedic Surgery

## 2016-10-03 ENCOUNTER — Encounter (HOSPITAL_COMMUNITY): Payer: Self-pay | Admitting: *Deleted

## 2016-10-03 ENCOUNTER — Encounter (HOSPITAL_COMMUNITY)
Admission: RE | Disposition: A | Discharge status: Home / Self Care | Payer: Self-pay | Source: Ambulatory Visit | Attending: Orthopedic Surgery

## 2016-10-03 DIAGNOSIS — Z79899 Other long term (current) drug therapy: Secondary | ICD-10-CM | POA: Diagnosis not present

## 2016-10-03 DIAGNOSIS — E039 Hypothyroidism, unspecified: Secondary | ICD-10-CM | POA: Diagnosis present

## 2016-10-03 DIAGNOSIS — Z7982 Long term (current) use of aspirin: Secondary | ICD-10-CM

## 2016-10-03 DIAGNOSIS — D649 Anemia, unspecified: Secondary | ICD-10-CM | POA: Diagnosis present

## 2016-10-03 DIAGNOSIS — Z85038 Personal history of other malignant neoplasm of large intestine: Secondary | ICD-10-CM | POA: Diagnosis not present

## 2016-10-03 DIAGNOSIS — M1611 Unilateral primary osteoarthritis, right hip: Principal | ICD-10-CM | POA: Diagnosis present

## 2016-10-03 DIAGNOSIS — Z96641 Presence of right artificial hip joint: Secondary | ICD-10-CM

## 2016-10-03 DIAGNOSIS — Z471 Aftercare following joint replacement surgery: Secondary | ICD-10-CM | POA: Diagnosis not present

## 2016-10-03 DIAGNOSIS — Z96653 Presence of artificial knee joint, bilateral: Secondary | ICD-10-CM | POA: Diagnosis not present

## 2016-10-03 DIAGNOSIS — I10 Essential (primary) hypertension: Secondary | ICD-10-CM | POA: Diagnosis not present

## 2016-10-03 DIAGNOSIS — Z85828 Personal history of other malignant neoplasm of skin: Secondary | ICD-10-CM

## 2016-10-03 DIAGNOSIS — E785 Hyperlipidemia, unspecified: Secondary | ICD-10-CM | POA: Diagnosis not present

## 2016-10-03 DIAGNOSIS — Z79890 Hormone replacement therapy (postmenopausal): Secondary | ICD-10-CM

## 2016-10-03 DIAGNOSIS — Z8673 Personal history of transient ischemic attack (TIA), and cerebral infarction without residual deficits: Secondary | ICD-10-CM | POA: Diagnosis not present

## 2016-10-03 DIAGNOSIS — Z419 Encounter for procedure for purposes other than remedying health state, unspecified: Secondary | ICD-10-CM

## 2016-10-03 DIAGNOSIS — J849 Interstitial pulmonary disease, unspecified: Secondary | ICD-10-CM

## 2016-10-03 DIAGNOSIS — H409 Unspecified glaucoma: Secondary | ICD-10-CM | POA: Diagnosis not present

## 2016-10-03 DIAGNOSIS — I517 Cardiomegaly: Secondary | ICD-10-CM | POA: Diagnosis not present

## 2016-10-03 HISTORY — PX: TOTAL HIP ARTHROPLASTY: SHX124

## 2016-10-03 HISTORY — DX: Transient cerebral ischemic attack, unspecified: G45.9

## 2016-10-03 HISTORY — DX: Gastro-esophageal reflux disease without esophagitis: K21.9

## 2016-10-03 SURGERY — ARTHROPLASTY, HIP, TOTAL, ANTERIOR APPROACH
Anesthesia: Spinal | Site: Hip | Laterality: Right

## 2016-10-03 MED ORDER — PHENYLEPHRINE HCL 10 MG/ML IJ SOLN
INTRAVENOUS | Status: DC | PRN
  Administered 2016-10-03: 10 ug/min via INTRAVENOUS

## 2016-10-03 MED ORDER — MAGNESIUM CITRATE PO SOLN
1.0000 | Freq: Once | ORAL | Status: DC | PRN
  Filled 2016-10-03: qty 296

## 2016-10-03 MED ORDER — FENTANYL CITRATE (PF) 250 MCG/5ML IJ SOLN
INTRAMUSCULAR | Status: AC
  Filled 2016-10-03: qty 5

## 2016-10-03 MED ORDER — METOCLOPRAMIDE HCL 5 MG PO TABS
5.0000 mg | ORAL_TABLET | Freq: Three times a day (TID) | ORAL | Status: DC | PRN
  Filled 2016-10-03: qty 2

## 2016-10-03 MED ORDER — CEFAZOLIN SODIUM-DEXTROSE 1-4 GM/50ML-% IV SOLN
1.0000 g | Freq: Three times a day (TID) | INTRAVENOUS | Status: AC
  Administered 2016-10-03 – 2016-10-04 (×2): 1 g via INTRAVENOUS
  Filled 2016-10-03 (×2): qty 50

## 2016-10-03 MED ORDER — FENTANYL CITRATE (PF) 100 MCG/2ML IJ SOLN
INTRAMUSCULAR | Status: AC
  Administered 2016-10-03: 25 ug via INTRAVENOUS
  Filled 2016-10-03: qty 2

## 2016-10-03 MED ORDER — BUPIVACAINE HCL (PF) 0.5 % IJ SOLN
INTRAMUSCULAR | Status: AC
  Filled 2016-10-03: qty 30

## 2016-10-03 MED ORDER — SODIUM CHLORIDE 0.9 % IV SOLN
1000.0000 mg | Freq: Once | INTRAVENOUS | Status: AC
  Administered 2016-10-03: 1000 mg via INTRAVENOUS
  Filled 2016-10-03: qty 10

## 2016-10-03 MED ORDER — BUPIVACAINE HCL 0.5 % IJ SOLN
INTRAMUSCULAR | Status: DC | PRN
  Administered 2016-10-03: 30 mL

## 2016-10-03 MED ORDER — METHOCARBAMOL 1000 MG/10ML IJ SOLN
500.0000 mg | Freq: Four times a day (QID) | INTRAVENOUS | Status: DC | PRN
  Filled 2016-10-03: qty 5

## 2016-10-03 MED ORDER — ACETAMINOPHEN 325 MG PO TABS
650.0000 mg | ORAL_TABLET | Freq: Four times a day (QID) | ORAL | Status: DC | PRN
  Administered 2016-10-03: 650 mg via ORAL
  Filled 2016-10-03: qty 2

## 2016-10-03 MED ORDER — METOCLOPRAMIDE HCL 5 MG/ML IJ SOLN
5.0000 mg | Freq: Three times a day (TID) | INTRAMUSCULAR | Status: DC | PRN
  Filled 2016-10-03: qty 2

## 2016-10-03 MED ORDER — HYDROMORPHONE HCL 1 MG/ML IJ SOLN: 0.5000 mg | INTRAMUSCULAR | Status: DC | PRN

## 2016-10-03 MED ORDER — FLUTICASONE PROPIONATE 50 MCG/ACT NA SUSP
1.0000 | Freq: Two times a day (BID) | NASAL | Status: DC
  Administered 2016-10-03 – 2016-10-05 (×4): 1 via NASAL
  Filled 2016-10-03: qty 16

## 2016-10-03 MED ORDER — PHENOL 1.4 % MT LIQD
1.0000 | OROMUCOSAL | Status: DC | PRN
  Filled 2016-10-03: qty 177

## 2016-10-03 MED ORDER — TRANEXAMIC ACID 1000 MG/10ML IV SOLN
1000.0000 mg | INTRAVENOUS | Status: AC
  Administered 2016-10-03: 1000 mg via INTRAVENOUS
  Filled 2016-10-03: qty 10

## 2016-10-03 MED ORDER — ALUM & MAG HYDROXIDE-SIMETH 200-200-20 MG/5ML PO SUSP
30.0000 mL | ORAL | Status: DC | PRN
  Filled 2016-10-03: qty 30

## 2016-10-03 MED ORDER — BISACODYL 5 MG PO TBEC
5.0000 mg | DELAYED_RELEASE_TABLET | Freq: Every day | ORAL | Status: DC | PRN
  Filled 2016-10-03: qty 1

## 2016-10-03 MED ORDER — MENTHOL 3 MG MT LOZG
1.0000 | LOZENGE | OROMUCOSAL | Status: DC | PRN
  Filled 2016-10-03: qty 9

## 2016-10-03 MED ORDER — ACETAMINOPHEN 500 MG PO TABS
1000.0000 mg | ORAL_TABLET | Freq: Four times a day (QID) | ORAL | Status: AC
  Administered 2016-10-04 (×3): 1000 mg via ORAL
  Filled 2016-10-03 (×5): qty 2

## 2016-10-03 MED ORDER — FINASTERIDE 5 MG PO TABS
5.0000 mg | ORAL_TABLET | Freq: Every evening | ORAL | Status: DC
  Administered 2016-10-03 – 2016-10-04 (×2): 5 mg via ORAL
  Filled 2016-10-03 (×2): qty 1

## 2016-10-03 MED ORDER — OXYCODONE HCL 5 MG PO TABS
5.0000 mg | ORAL_TABLET | ORAL | Status: DC | PRN
  Administered 2016-10-03: 10 mg via ORAL

## 2016-10-03 MED ORDER — ONDANSETRON HCL 4 MG/2ML IJ SOLN
4.0000 mg | Freq: Four times a day (QID) | INTRAMUSCULAR | Status: DC | PRN
  Filled 2016-10-03: qty 2

## 2016-10-03 MED ORDER — MOMETASONE FURO-FORMOTEROL FUM 200-5 MCG/ACT IN AERO
2.0000 | INHALATION_SPRAY | Freq: Two times a day (BID) | RESPIRATORY_TRACT | Status: DC
  Administered 2016-10-03 – 2016-10-05 (×4): 2 via RESPIRATORY_TRACT
  Filled 2016-10-03: qty 8.8

## 2016-10-03 MED ORDER — METHOCARBAMOL 500 MG PO TABS
500.0000 mg | ORAL_TABLET | Freq: Four times a day (QID) | ORAL | Status: DC | PRN
  Filled 2016-10-03: qty 1

## 2016-10-03 MED ORDER — ONDANSETRON HCL 4 MG PO TABS
4.0000 mg | ORAL_TABLET | Freq: Four times a day (QID) | ORAL | Status: DC | PRN
  Filled 2016-10-03: qty 1

## 2016-10-03 MED ORDER — ATORVASTATIN CALCIUM 20 MG PO TABS
20.0000 mg | ORAL_TABLET | Freq: Every day | ORAL | Status: DC
  Administered 2016-10-04: 20 mg via ORAL
  Filled 2016-10-03: qty 1

## 2016-10-03 MED ORDER — AMLODIPINE BESYLATE 5 MG PO TABS
5.0000 mg | ORAL_TABLET | Freq: Every day | ORAL | Status: DC
  Administered 2016-10-03 – 2016-10-04 (×2): 5 mg via ORAL
  Filled 2016-10-03 (×2): qty 1

## 2016-10-03 MED ORDER — POLYETHYLENE GLYCOL 3350 17 G PO PACK
17.0000 g | PACK | Freq: Every day | ORAL | Status: DC | PRN
  Filled 2016-10-03: qty 1

## 2016-10-03 MED ORDER — PROPOFOL 500 MG/50ML IV EMUL
INTRAVENOUS | Status: DC | PRN
  Administered 2016-10-03: 50 ug/kg/min via INTRAVENOUS

## 2016-10-03 MED ORDER — ALBUTEROL SULFATE (2.5 MG/3ML) 0.083% IN NEBU
3.0000 mL | INHALATION_SOLUTION | Freq: Four times a day (QID) | RESPIRATORY_TRACT | Status: DC | PRN
  Administered 2016-10-03: 3 mL via RESPIRATORY_TRACT
  Filled 2016-10-03: qty 3

## 2016-10-03 MED ORDER — SODIUM CHLORIDE 0.9 % IV SOLN
INTRAVENOUS | Status: DC
  Administered 2016-10-03 – 2016-10-04 (×2): via INTRAVENOUS

## 2016-10-03 MED ORDER — ACETAMINOPHEN 650 MG RE SUPP
650.0000 mg | Freq: Four times a day (QID) | RECTAL | Status: DC | PRN
  Filled 2016-10-03: qty 1

## 2016-10-03 MED ORDER — ACETAMINOPHEN 325 MG PO TABS
ORAL_TABLET | ORAL | Status: AC
  Filled 2016-10-03: qty 1

## 2016-10-03 MED ORDER — SIMVASTATIN 40 MG PO TABS
40.0000 mg | ORAL_TABLET | Freq: Every day | ORAL | Status: DC
  Administered 2016-10-03: 40 mg via ORAL
  Filled 2016-10-03: qty 1

## 2016-10-03 MED ORDER — DOCUSATE SODIUM 100 MG PO CAPS
100.0000 mg | ORAL_CAPSULE | Freq: Two times a day (BID) | ORAL | Status: DC
  Administered 2016-10-03 – 2016-10-05 (×5): 100 mg via ORAL
  Filled 2016-10-03 (×6): qty 1

## 2016-10-03 MED ORDER — DORZOLAMIDE HCL-TIMOLOL MAL 2-0.5 % OP SOLN
1.0000 [drp] | Freq: Two times a day (BID) | OPHTHALMIC | Status: DC
  Administered 2016-10-03 – 2016-10-05 (×4): 1 [drp] via OPHTHALMIC
  Filled 2016-10-03: qty 10

## 2016-10-03 MED ORDER — FENTANYL CITRATE (PF) 100 MCG/2ML IJ SOLN
25.0000 ug | INTRAMUSCULAR | Status: DC | PRN
  Administered 2016-10-03: 50 ug via INTRAVENOUS
  Administered 2016-10-03 (×2): 25 ug via INTRAVENOUS

## 2016-10-03 MED ORDER — ASPIRIN EC 325 MG PO TBEC
325.0000 mg | DELAYED_RELEASE_TABLET | Freq: Every day | ORAL | Status: DC
  Administered 2016-10-04: 325 mg via ORAL
  Filled 2016-10-03: qty 1

## 2016-10-03 MED ORDER — PANTOPRAZOLE SODIUM 40 MG PO TBEC
40.0000 mg | DELAYED_RELEASE_TABLET | Freq: Every day | ORAL | Status: DC
  Administered 2016-10-04 – 2016-10-05 (×2): 40 mg via ORAL
  Filled 2016-10-03 (×2): qty 1

## 2016-10-03 MED ORDER — LACTATED RINGERS IV SOLN
INTRAVENOUS | Status: DC
  Administered 2016-10-03 (×2): via INTRAVENOUS

## 2016-10-03 MED ORDER — 0.9 % SODIUM CHLORIDE (POUR BTL) OPTIME
TOPICAL | Status: DC | PRN
  Administered 2016-10-03: 1000 mL

## 2016-10-03 MED ORDER — CHLORHEXIDINE GLUCONATE 4 % EX LIQD: 60.0000 mL | Freq: Once | CUTANEOUS | Status: DC

## 2016-10-03 MED ORDER — BUPIVACAINE LIPOSOME 1.3 % IJ SUSP
20.0000 mL | INTRAMUSCULAR | Status: AC
  Administered 2016-10-03: 20 mL
  Filled 2016-10-03: qty 20

## 2016-10-03 MED ORDER — OXYCODONE HCL 5 MG PO TABS
ORAL_TABLET | ORAL | Status: AC
  Filled 2016-10-03: qty 2

## 2016-10-03 MED ORDER — LEVOTHYROXINE SODIUM 75 MCG PO TABS
75.0000 ug | ORAL_TABLET | Freq: Every day | ORAL | Status: DC
  Administered 2016-10-03 – 2016-10-04 (×2): 75 ug via ORAL
  Filled 2016-10-03 (×2): qty 1

## 2016-10-03 MED ORDER — HYDROCODONE-ACETAMINOPHEN 7.5-325 MG PO TABS
1.0000 | ORAL_TABLET | Freq: Four times a day (QID) | ORAL | Status: DC
  Administered 2016-10-03 – 2016-10-05 (×8): 1 via ORAL
  Filled 2016-10-03 (×8): qty 1

## 2016-10-03 SURGICAL SUPPLY — 56 items
BENZOIN TINCTURE PRP APPL 2/3 (GAUZE/BANDAGES/DRESSINGS) ×3 IMPLANT
BLADE CLIPPER SURG (BLADE) IMPLANT
BNDG COHESIVE 6X5 TAN STRL LF (GAUZE/BANDAGES/DRESSINGS) IMPLANT
BNDG GAUZE ELAST 4 BULKY (GAUZE/BANDAGES/DRESSINGS) IMPLANT
CAPT HIP TOTAL 2 ×3 IMPLANT
CELLS DAT CNTRL 66122 CELL SVR (MISCELLANEOUS) IMPLANT
CLOSURE STERI-STRIP 1/2X4 (GAUZE/BANDAGES/DRESSINGS) ×1
CLSR STERI-STRIP ANTIMIC 1/2X4 (GAUZE/BANDAGES/DRESSINGS) ×2 IMPLANT
COVER PERINEAL POST (MISCELLANEOUS) ×3 IMPLANT
COVER SURGICAL LIGHT HANDLE (MISCELLANEOUS) ×3 IMPLANT
DRAPE C-ARM 42X72 X-RAY (DRAPES) ×3 IMPLANT
DRAPE STERI IOBAN 125X83 (DRAPES) ×3 IMPLANT
DRAPE U-SHAPE 47X51 STRL (DRAPES) ×6 IMPLANT
DRSG AQUACEL AG ADV 3.5X10 (GAUZE/BANDAGES/DRESSINGS) ×3 IMPLANT
DRSG AQUACEL AG ADV 3.5X14 (GAUZE/BANDAGES/DRESSINGS) ×3 IMPLANT
DURAPREP 26ML APPLICATOR (WOUND CARE) ×3 IMPLANT
ELECT BLADE 4.0 EZ CLEAN MEGAD (MISCELLANEOUS)
ELECT CAUTERY BLADE 6.4 (BLADE) ×3 IMPLANT
ELECT REM PT RETURN 9FT ADLT (ELECTROSURGICAL) ×3
ELECTRODE BLDE 4.0 EZ CLN MEGD (MISCELLANEOUS) IMPLANT
ELECTRODE REM PT RTRN 9FT ADLT (ELECTROSURGICAL) ×1 IMPLANT
GLOVE BIOGEL PI IND STRL 8 (GLOVE) ×2 IMPLANT
GLOVE BIOGEL PI INDICATOR 8 (GLOVE) ×4
GLOVE ECLIPSE 7.5 STRL STRAW (GLOVE) ×6 IMPLANT
GOWN STRL REUS W/ TWL LRG LVL3 (GOWN DISPOSABLE) ×2 IMPLANT
GOWN STRL REUS W/ TWL XL LVL3 (GOWN DISPOSABLE) ×2 IMPLANT
GOWN STRL REUS W/TWL LRG LVL3 (GOWN DISPOSABLE) ×4
GOWN STRL REUS W/TWL XL LVL3 (GOWN DISPOSABLE) ×4
HOOD PEEL AWAY FACE SHEILD DIS (HOOD) ×6 IMPLANT
KIT BASIN OR (CUSTOM PROCEDURE TRAY) ×3 IMPLANT
KIT ROOM TURNOVER OR (KITS) ×3 IMPLANT
MANIFOLD NEPTUNE II (INSTRUMENTS) ×3 IMPLANT
NEEDLE SPNL 22GX3.5 QUINCKE BK (NEEDLE) ×3 IMPLANT
NS IRRIG 1000ML POUR BTL (IV SOLUTION) ×3 IMPLANT
PACK TOTAL JOINT (CUSTOM PROCEDURE TRAY) ×3 IMPLANT
PACK UNIVERSAL I (CUSTOM PROCEDURE TRAY) ×3 IMPLANT
PAD ARMBOARD 7.5X6 YLW CONV (MISCELLANEOUS) ×6 IMPLANT
RTRCTR WOUND ALEXIS 18CM MED (MISCELLANEOUS)
RTRCTR WOUND ALEXIS 18CM SML (INSTRUMENTS) ×3
SAVER CELL AAL HAEMONETICS (INSTRUMENTS) ×1 IMPLANT
SAW OSC TIP CART 19.5X105X1.3 (SAW) ×3 IMPLANT
SPONGE LAP 18X18 X RAY DECT (DISPOSABLE) ×3 IMPLANT
STAPLER VISISTAT 35W (STAPLE) IMPLANT
SUT ETHIBOND NAB CT1 #1 30IN (SUTURE) ×6 IMPLANT
SUT MNCRL AB 3-0 PS2 18 (SUTURE) ×3 IMPLANT
SUT VIC AB 0 CT1 27 (SUTURE) ×2
SUT VIC AB 0 CT1 27XBRD ANBCTR (SUTURE) ×1 IMPLANT
SUT VIC AB 1 CT1 27 (SUTURE) ×4
SUT VIC AB 1 CT1 27XBRD ANBCTR (SUTURE) ×2 IMPLANT
SUT VIC AB 2-0 CT1 27 (SUTURE) ×2
SUT VIC AB 2-0 CT1 TAPERPNT 27 (SUTURE) ×1 IMPLANT
SYR 50ML LL SCALE MARK (SYRINGE) ×3 IMPLANT
TOWEL OR 17X24 6PK STRL BLUE (TOWEL DISPOSABLE) ×3 IMPLANT
TOWEL OR 17X26 10 PK STRL BLUE (TOWEL DISPOSABLE) ×3 IMPLANT
TRAY CATH 16FR W/PLASTIC CATH (SET/KITS/TRAYS/PACK) IMPLANT
TRAY FOLEY CATH SILVER 16FR (SET/KITS/TRAYS/PACK) IMPLANT

## 2016-10-03 NOTE — Brief Op Note (Signed)
10/03/2016  1:29 PM  PATIENT:  Marcus Brick Sr.  83 y.o. male  PRE-OPERATIVE DIAGNOSIS:  OSTEOARTHRITIS RIGHT HIP  POST-OPERATIVE DIAGNOSIS:  OSTEOARTHRITIS RIGHT HIP  PROCEDURE:  Procedure(s): TOTAL HIP ARTHROPLASTY ANTERIOR APPROACH (Right)  SURGEON:  Surgeon(s) and Role:    Dorna Leitz, MD - Primary  PHYSICIAN ASSISTANT:   ASSISTANTS: blair roberts pac   ANESTHESIA:   spinal  EBL:  Total I/O In: 1000 [I.V.:1000] Out: -   BLOOD ADMINISTERED:none  DRAINS: none   LOCAL MEDICATIONS USED:  MARCAINE    and OTHER experel  SPECIMEN:  No Specimen  DISPOSITION OF SPECIMEN:  N/A  COUNTS:  YES  TOURNIQUET:  * No tourniquets in log *  DICTATION: .Other Dictation: Dictation Number [id number]  PLAN OF CARE: Admit to inpatient   PATIENT DISPOSITION:  PACU - hemodynamically stable.   Delay start of Pharmacological VTE agent (>24hrs) due to surgical blood loss or risk of bleeding: no

## 2016-10-03 NOTE — Anesthesia Procedure Notes (Signed)
Procedure Name: MAC Date/Time: 10/03/2016 11:45 AM Performed by: Oletta Lamas Pre-anesthesia Checklist: Patient identified, Emergency Drugs available, Suction available, Patient being monitored and Timeout performed Patient Re-evaluated:Patient Re-evaluated prior to inductionOxygen Delivery Method: Simple face mask

## 2016-10-03 NOTE — Anesthesia Procedure Notes (Signed)
Spinal  Patient location during procedure: OR Staffing Anesthesiologist: Lyndle Herrlich Preanesthetic Checklist Completed: patient identified, site marked, surgical consent, pre-op evaluation, timeout performed, IV checked, risks and benefits discussed and monitors and equipment checked Spinal Block Patient position: sitting Prep: DuraPrep Patient monitoring: heart rate, cardiac monitor, continuous pulse ox and blood pressure Approach: midline Location: L3-4 Injection technique: single-shot Needle Needle type: Sprotte  Needle gauge: 24 G Needle length: 9 cm Assessment Sensory level: T4 Additional Notes Spinal Dosage in OR  .75% Bupivicaine ml       1.9 RLD x 3 min

## 2016-10-03 NOTE — Transfer of Care (Signed)
Immediate Anesthesia Transfer of Care Note  Patient: Marcus Brick Sr.  Procedure(s) Performed: Procedure(s): TOTAL HIP ARTHROPLASTY ANTERIOR APPROACH (Right)  Patient Location: PACU  Anesthesia Type:General  Level of Consciousness: awake, alert , oriented and patient cooperative  Airway & Oxygen Therapy: Patient Spontanous Breathing  Post-op Assessment: Report given to RN and Post -op Vital signs reviewed and stable  Post vital signs: Reviewed and stable  Last Vitals:  Vitals:   10/03/16 0835  BP: (!) 170/90  Pulse: (!) 55  Resp: 18  Temp: 36.7 C    Last Pain:  Vitals:   10/03/16 0835  TempSrc: Oral         Complications: No apparent anesthesia complications

## 2016-10-03 NOTE — Anesthesia Preprocedure Evaluation (Addendum)
Anesthesia Evaluation  Patient identified by MRN, date of birth, ID band Patient awake    Reviewed: Allergy & Precautions, H&P , Patient's Chart, lab work & pertinent test results  Airway Mallampati: II  TM Distance: >3 FB Neck ROM: full    Dental no notable dental hx.    Pulmonary    Pulmonary exam normal breath sounds clear to auscultation       Cardiovascular Exercise Tolerance: Good hypertension, On Medications  Rhythm:regular Rate:Normal     Neuro/Psych PSYCHIATRIC DISORDERS    GI/Hepatic   Endo/Other  Hypothyroidism   Renal/GU      Musculoskeletal   Abdominal   Peds  Hematology  (+) anemia ,   Anesthesia Other Findings Hypertension     Hypothyroidism   Asthma   Aortic stenosis   Aortic valve:     There was mild stenosis.   There was trivial regurgitation.   Valve area 1.81 cm^2 Peak gradient (S): 17 mm Hg.  Reproductive/Obstetrics                            Anesthesia Physical Anesthesia Plan  ASA: III  Anesthesia Plan: Spinal   Post-op Pain Management:    Induction:   PONV Risk Score and Plan:   Airway Management Planned:   Additional Equipment:   Intra-op Plan:   Post-operative Plan:   Informed Consent: I have reviewed the patients History and Physical, chart, labs and discussed the procedure including the risks, benefits and alternatives for the proposed anesthesia with the patient or authorized representative who has indicated his/her understanding and acceptance.     Plan Discussed with:   Anesthesia Plan Comments: (  )        Anesthesia Quick Evaluation

## 2016-10-04 LAB — CBC
HCT: 28.9 % — ABNORMAL LOW (ref 39.0–52.0)
Hemoglobin: 9.3 g/dL — ABNORMAL LOW (ref 13.0–17.0)
MCH: 27.8 pg (ref 26.0–34.0)
MCHC: 32.2 g/dL (ref 30.0–36.0)
MCV: 86.3 fL (ref 78.0–100.0)
PLATELETS: 230 10*3/uL (ref 150–400)
RBC: 3.35 MIL/uL — AB (ref 4.22–5.81)
RDW: 15.5 % (ref 11.5–15.5)
WBC: 7.2 10*3/uL (ref 4.0–10.5)

## 2016-10-04 LAB — BASIC METABOLIC PANEL
ANION GAP: 4 — AB (ref 5–15)
BUN: 16 mg/dL (ref 6–20)
CALCIUM: 7.7 mg/dL — AB (ref 8.9–10.3)
CO2: 24 mmol/L (ref 22–32)
Chloride: 107 mmol/L (ref 101–111)
Creatinine, Ser: 1.26 mg/dL — ABNORMAL HIGH (ref 0.61–1.24)
GFR, EST NON AFRICAN AMERICAN: 52 mL/min — AB (ref >60)
Glucose, Bld: 127 mg/dL — ABNORMAL HIGH (ref 65–99)
POTASSIUM: 3.8 mmol/L (ref 3.5–5.1)
Sodium: 135 mmol/L (ref 135–145)

## 2016-10-04 MED ORDER — ASPIRIN 81 MG PO CHEW
324.0000 mg | CHEWABLE_TABLET | Freq: Every day | ORAL | 0 refills | Status: DC
Start: 2016-10-04 — End: 2018-04-19

## 2016-10-04 MED ORDER — HYDROCODONE-ACETAMINOPHEN 5-325 MG PO TABS: 1.0000 | ORAL_TABLET | ORAL | 0 refills | Status: DC | PRN

## 2016-10-04 MED ORDER — ASPIRIN 325 MG PO TBEC: 325.0000 mg | DELAYED_RELEASE_TABLET | Freq: Two times a day (BID) | ORAL | 0 refills | Status: DC

## 2016-10-04 MED ORDER — ASPIRIN EC 325 MG PO TBEC
325.0000 mg | DELAYED_RELEASE_TABLET | Freq: Two times a day (BID) | ORAL | Status: DC
  Administered 2016-10-04 – 2016-10-05 (×2): 325 mg via ORAL
  Filled 2016-10-04 (×2): qty 1

## 2016-10-04 NOTE — Progress Notes (Signed)
Physical Therapy Treatment Patient Details Name: Marcus GUGGENHEIM Sr. MRN: [id number] DOB: [birth year] Today's Date: 10/04/2016    History of Present Illness Pt is 83 yo male with PMH: colon cancer, TIA, CVA, bilateral TKA who presents for R anterior THA after prolonged OA.     PT Comments    Pt feeling much better after receiving food. Ambulated 32' with RW and min-guard A. Needing min A for bed mobility with difficulty moving RLE. PT will continue to follow.    Follow Up Recommendations  Home health PT     Equipment Recommendations  Rolling walker with 5" wheels;3in1 (PT)    Recommendations for Other Services       Precautions / Restrictions Precautions Precautions: None Restrictions Weight Bearing Restrictions: Yes RLE Weight Bearing: Weight bearing as tolerated    Mobility  Bed Mobility Overal bed mobility: Needs Assistance Bed Mobility: Sit to Supine       Sit to supine: Min assist   General bed mobility comments: Min A to RLE for return to bed and positioning in bed. Pt with good core control and ability to position upper body but having difficulty lifting left leg  Transfers Overall transfer level: Needs assistance Equipment used: Rolling walker (2 wheeled) Transfers: Sit to/from Stand Sit to Stand: Supervision         General transfer comment: pt stood safely from recliner. Reminder to keep RW with him until in safe positon to return to sitting  Ambulation/Gait Ambulation/Gait assistance: Min guard Ambulation Distance (Feet): 75 Feet Assistive device: Rolling walker (2 wheeled) Gait Pattern/deviations: Step-through pattern;Antalgic;Decreased stance time - right;Decreased weight shift to right Gait velocity: decreased Gait velocity interpretation: Below normal speed for age/gender General Gait Details: vc's for erect posture   Stairs            Wheelchair Mobility    Modified Rankin (Stroke Patients Only)       Balance Overall balance  assessment: No apparent balance deficits (not formally assessed)                                          Cognition Arousal/Alertness: Awake/alert Behavior During Therapy: WFL for tasks assessed/performed Overall Cognitive Status: Within Functional Limits for tasks assessed                                        Exercises Total Joint Exercises Ankle Circles/Pumps: AROM;Both;10 reps;Supine Quad Sets: AROM;Both;10 reps;Supine Heel Slides: AAROM;Right;5 reps;Supine Hip ABduction/ADduction: AAROM;Right;10 reps;Supine Bridges: AROM;5 reps;Supine    General Comments General comments (skin integrity, edema, etc.): O2 sats 98%, HR 66 bpm. Discussed car transfer and trip home tomorrow because he will have an hour drive.       Pertinent Vitals/Pain Pain Assessment: 0-10 Pain Score: 5  Pain Location: right hip Pain Descriptors / Indicators: Aching Pain Intervention(s): Limited activity within patient's tolerance;Monitored during session;Premedicated before session    Home Living                      Prior Function            PT Goals (current goals can now be found in the care plan section) Acute Rehab PT Goals Patient Stated Goal: return home PT Goal Formulation: With patient Time For Goal Achievement: 10/11/16  Potential to Achieve Goals: Good Progress towards PT goals: Progressing toward goals    Frequency    7X/week      PT Plan Current plan remains appropriate    Co-evaluation              AM-PAC PT "6 Clicks" Daily Activity  Outcome Measure  Difficulty turning over in bed (including adjusting bedclothes, sheets and blankets)?: A Little Difficulty moving from lying on back to sitting on the side of the bed? : Total Difficulty sitting down on and standing up from a chair with arms (e.g., wheelchair, bedside commode, etc,.)?: A Little Help needed moving to and from a bed to chair (including a wheelchair)?: A  Little Help needed walking in hospital room?: A Little Help needed climbing 3-5 steps with a railing? : A Little 6 Click Score: 16    End of Session Equipment Utilized During Treatment: Gait belt Activity Tolerance: Patient tolerated treatment well Patient left: with call bell/phone within reach;in bed Nurse Communication: Mobility status PT Visit Diagnosis: Pain;Muscle weakness (generalized) (M62.81);Difficulty in walking, not elsewhere classified (R26.2) Pain - Right/Left: Right Pain - part of body: Hip     Time: 1202-1218 PT Time Calculation (min) (ACUTE ONLY): 16 min  Charges:  $Gait Training: 8-22 mins                    G Codes:       Leighton Roach, PT  Acute Rehab Services  [place] 10/04/2016, 1:44 PM

## 2016-10-04 NOTE — Evaluation (Signed)
Physical Therapy Evaluation Patient Details Name: Marcus BUSHEY Sr. MRN: [id number] DOB: [birth year] Today's Date: 10/04/2016   History of Present Illness  Pt is 83 yo male with PMH: colon cancer, TIA, CVA, bilateral TKA who presents for R anterior THA after prolonged OA.   Clinical Impression  Pt is s/p TKA resulting in the deficits listed below (see PT Problem List). Pt doing well but treatment limited by pt still being NPO so did not receive dinner last night or breakfast this morning and was light headed when getting up. Ambulated 10' with RW and min-guard A. Will plan to advance ambulation in afternoon when he has eaten.  Pt will benefit from skilled PT to increase their independence and safety with mobility to allow discharge to the venue listed below.      Follow Up Recommendations Home health PT    Equipment Recommendations  Rolling walker with 5" wheels;3in1 (PT)    Recommendations for Other Services       Precautions / Restrictions Precautions Precautions: None Restrictions Weight Bearing Restrictions: Yes RLE Weight Bearing: Weight bearing as tolerated      Mobility  Bed Mobility Overal bed mobility: Needs Assistance Bed Mobility: Supine to Sit     Supine to sit: Min assist     General bed mobility comments: vc's for sequencing, use of rail, min A to slide RLE to EOB  Transfers Overall transfer level: Needs assistance Equipment used: Rolling walker (2 wheeled) Transfers: Sit to/from [facility] Sit to Stand: Min guard Stand pivot transfers: Min guard       General transfer comment: vc's for hand placement. Pt mildly dizzy  Ambulation/Gait Ambulation/Gait assistance: Min guard Ambulation Distance (Feet): 10 Feet (5' fwd, 5' bkwd) Assistive device: Rolling walker (2 wheeled) Gait Pattern/deviations: Step-through pattern;Antalgic;Decreased stance time - right;Decreased weight shift to right     General Gait Details: distance limited by  pt feeling mildly dizzy, had not eaten since before surgery due to NPO orders not being lifted. Gave pt crackers before session and waiting on tray.   Stairs            Wheelchair Mobility    Modified Rankin (Stroke Patients Only)       Balance Overall balance assessment: No apparent balance deficits (not formally assessed)                                           Pertinent Vitals/Pain Pain Assessment: Faces Faces Pain Scale: Hurts little more Pain Location: right hip Pain Descriptors / Indicators: Aching Pain Intervention(s): Limited activity within patient's tolerance;Monitored during session    [place] expects to be discharged to:: Private residence Living Arrangements: Spouse/significant other Available Help at Discharge: Family;Available 24 hours/day Type of Home: House Home Access: Level entry     Home Layout: One level Home Equipment: None Additional Comments: pt will be going with his wife to a family member's home that is one level with level entry. He can stay as long as needed before going back to his multi level home    Prior Function Level of Independence: Independent               Hand Dominance        Extremity/Trunk Assessment   Upper Extremity Assessment Upper Extremity Assessment: Overall WFL for tasks assessed    Lower Extremity Assessment Lower Extremity Assessment:  RLE deficits/detail RLE Deficits / Details: knee flex/ ext 4/5, hip flex 2/5, limited hip extension and abduction,  ankle WFL    Cervical / Trunk Assessment Cervical / Trunk Assessment: Normal  Communication   Communication: No difficulties  Cognition Arousal/Alertness: Awake/alert Behavior During Therapy: WFL for tasks assessed/performed Overall Cognitive Status: Within Functional Limits for tasks assessed                                        General Comments General comments (skin integrity, edema,  etc.): O2 sats 97%    Exercises Total Joint Exercises Ankle Circles/Pumps: AROM;Both;10 reps;Seated;Supine Quad Sets: AROM;Both;10 reps;Seated Gluteal Sets: AROM;Both;10 reps;Seated Heel Slides: AAROM;Right;5 reps;Supine Straight Leg Raises: AAROM;Right;5 reps;Supine Standing Hip Extension: AROM;Right;10 reps;Standing   Assessment/Plan    PT Assessment Patient needs continued PT services  PT Problem List Decreased strength;Decreased range of motion;Decreased mobility;Decreased knowledge of use of DME;Decreased knowledge of precautions;Pain       PT Treatment Interventions DME instruction;Gait training;Stair training;Functional mobility training;Therapeutic activities;Therapeutic exercise;Patient/family education    PT Goals (Current goals can be found in the Care Plan section)  Acute Rehab PT Goals Patient Stated Goal: return home PT Goal Formulation: With patient Time For Goal Achievement: 10/11/16 Potential to Achieve Goals: Good    Frequency 7X/week   Barriers to discharge        Co-evaluation               AM-PAC PT "6 Clicks" Daily Activity  Outcome Measure Difficulty turning over in bed (including adjusting bedclothes, sheets and blankets)?: A Little Difficulty moving from lying on back to sitting on the side of the bed? : Total Difficulty sitting down on and standing up from a chair with arms (e.g., wheelchair, bedside commode, etc,.)?: A Little Help needed moving to and from a bed to chair (including a wheelchair)?: A Little Help needed walking in hospital room?: A Little Help needed climbing 3-5 steps with a railing? : A Little 6 Click Score: 16    End of Session Equipment Utilized During Treatment: Gait belt Activity Tolerance: Treatment limited secondary to medical complications (Comment) (dizziness) Patient left: in chair;with call bell/phone within reach Nurse Communication: Mobility status PT Visit Diagnosis: Dizziness and giddiness  (R42);Pain;Muscle weakness (generalized) (M62.81);Difficulty in walking, not elsewhere classified (R26.2) Pain - Right/Left: Right Pain - part of body: Hip    Time: 0814-0850 PT Time Calculation (min) (ACUTE ONLY): 36 min   Charges:   PT Evaluation $PT Eval Moderate Complexity: 1 Procedure PT Treatments $Gait Training: 8-22 mins   PT G Codes:        Leighton Roach, PT  Acute Rehab Services  [place] 10/04/2016, 9:39 AM

## 2016-10-04 NOTE — Discharge Instructions (Signed)

## 2016-10-04 NOTE — Progress Notes (Signed)
   PATIENT ID: Marcus Brick Sr.   1 Day Post-Op Procedure(s) (LRB): TOTAL HIP ARTHROPLASTY ANTERIOR APPROACH (Right)  Subjective: Doing well, minimal pain. Eager to start with PT.   Objective:  Vitals:   10/04/16 0009 10/04/16 0607  BP: (!) 159/69 (!) 149/71  Pulse: 64 68  Resp: 16 16  Temp: 99.9 F (37.7 C) 98.1 F (36.7 C)     R hip dressing c/d/i Wiggles toes, distally NVI  Labs:   Recent Labs  10/04/16 0424  HGB 9.3*   Recent Labs  10/04/16 0424  WBC 7.2  RBC 3.35*  HCT 28.9*  PLT 230   Recent Labs  10/04/16 0424  NA 135  K 3.8  CL 107  CO2 24  BUN 16  CREATININE 1.26*  GLUCOSE 127*  CALCIUM 7.7*    Assessment and Plan: 1 day s/p R THA WBAT  Up with PT today Minimize narcotics D/c home when cleared by PT, planning tomorrow Scripts in chart Fu with Dr. Berenice Primas in 2 weeks  VTE proph: ASA, SCDs

## 2016-10-05 LAB — CBC
HEMATOCRIT: 28.3 % — AB (ref 39.0–52.0)
HEMOGLOBIN: 9 g/dL — AB (ref 13.0–17.0)
MCH: 27.4 pg (ref 26.0–34.0)
MCHC: 31.8 g/dL (ref 30.0–36.0)
MCV: 86 fL (ref 78.0–100.0)
Platelets: 232 10*3/uL (ref 150–400)
RBC: 3.29 MIL/uL — AB (ref 4.22–5.81)
RDW: 15.5 % (ref 11.5–15.5)
WBC: 10.4 10*3/uL (ref 4.0–10.5)

## 2016-10-05 NOTE — Discharge Summary (Addendum)
Patient ID: Marcus Brick Sr. MRN: [id number] DOB/AGE: Mar 31, 1934 83 y.o.  Admit date: 10/03/2016 Discharge date: 10/05/2016  Admission Diagnoses:  Principal Problem:   Status post total hip replacement, right Active Problems:   Primary osteoarthritis of right hip   Discharge Diagnoses:  Same  Past Medical History:  Diagnosis Date  . Aortic stenosis    mild AS 09/2015 echo  . Aortic valve disease    mild  . Asthma    as child  . Basal ganglia hemorrhage (HCC)    left basal ganlia intraparenchymal hemoorhage 06/17/16 (in the setting of HTN emergency)  . Colon cancer ([place]) 10/10/2015  . GERD (gastroesophageal reflux disease)   . Hyperlipidemia   . Hypertension   . Hypothyroidism   . Iron deficiency anemia due to chronic blood loss 10/02/2015  . Lower GI bleed 10/02/2015  . Skin cancer   . Thyroid disease   . TIA (transient ischemic attack)     Surgeries: Procedure(s):RIGHT TOTAL HIP ARTHROPLASTY ANTERIOR APPROACH on 10/03/2016   Consultants:   Discharged Condition: Improved  Hospital Course: Garlan Drewes. is an 83 y.o. male who was admitted 10/03/2016 for operative treatment of Primary Osteoarthritis of the Right Hip. Patient has severe unremitting pain that affects sleep, daily activities, and work/hobbies. After pre-op clearance the patient was taken to the operating room on 10/03/2016 and underwent  Procedure(s):RIGHT TOTAL HIP ARTHROPLASTY ANTERIOR APPROACH.    Patient was given perioperative antibiotics:  Anti-infectives    Start     Dose/Rate Route Frequency Ordered Stop   10/03/16 2000  ceFAZolin (ANCEF) IVPB 1 g/50 mL premix     1 g 100 mL/hr over 30 Minutes Intravenous Every 8 hours 10/03/16 1540 10/04/16 0633   10/03/16 0945  ceFAZolin (ANCEF) IVPB 2g/100 mL premix     2 g 200 mL/hr over 30 Minutes Intravenous On call to O.R. 10/02/16 1011 10/03/16 1208       Patient was given sequential compression devices, early ambulation, and asa to prevent  DVT.  Patient benefited maximally from hospital stay and there were no complications.    Recent vital signs: No data found.    Recent laboratory studies:   Recent Labs  10/04/16 0424 10/05/16 0353  WBC 7.2 10.4  HGB 9.3* 9.0*  HCT 28.9* 28.3*  PLT 230 232  NA 135  --   K 3.8  --   CL 107  --   CO2 24  --   BUN 16  --   CREATININE 1.26*  --   GLUCOSE 127*  --   CALCIUM 7.7*  --      Discharge Medications:   Allergies as of 10/05/2016      Reactions   No Known Allergies       Medication List    STOP taking these medications   acetaminophen 650 MG CR tablet Commonly known as:  TYLENOL     TAKE these medications   albuterol 108 (90 Base) MCG/ACT inhaler Commonly known as:  PROVENTIL HFA;VENTOLIN HFA Inhale 2 puffs into the lungs every 6 (six) hours as needed for wheezing. What changed:  reasons to take this   alendronate 70 MG tablet Commonly known as:  FOSAMAX Take 1 tablet (70 mg total) by mouth every 7 (seven) days.   amLODipine 10 MG tablet Commonly known as:  NORVASC Take 0.5 tablets (5 mg total) by mouth 2 (two) times daily. What changed:  when to take this   aspirin 81 MG chewable  tablet Chew 4 tablets (324 mg total) by mouth at bedtime. What changed:  how much to take   aspirin 325 MG EC tablet Take 1 tablet (325 mg total) by mouth 2 (two) times daily. What changed:  You were already taking a medication with the same name, and this prescription was added. Make sure you understand how and when to take each.   budesonide-formoterol 160-4.5 MCG/ACT inhaler Commonly known as:  SYMBICORT Inhale 1 puff into the lungs 2 (two) times daily.   dorzolamide-timolol 22.3-6.8 MG/ML ophthalmic solution Commonly known as:  COSOPT Place 1 drop into both eyes 2 (two) times daily.   DULoxetine 30 MG capsule Commonly known as:  CYMBALTA Take 1 capsule (30 mg total) by mouth daily.   finasteride 5 MG tablet Commonly known as:  PROSCAR Take 1 tablet (5 mg  total) by mouth daily. What changed:  when to take this   fluticasone 50 MCG/ACT nasal spray Commonly known as:  FLONASE One spray in each nostril twice a day, use left hand for right nostril, and right hand for left nostril. What changed:  how much to take  how to take this  when to take this  additional instructions   hydrochlorothiazide 25 MG tablet Commonly known as:  HYDRODIURIL TAKE 1 TABLET DAILY   HYDROcodone-acetaminophen 5-325 MG tablet Commonly known as:  NORCO Take 1-2 tablets by mouth every 4 (four) hours as needed for moderate pain.   levothyroxine 75 MCG tablet Commonly known as:  SYNTHROID, LEVOTHROID TAKE 1 TABLET DAILY What changed:  See the new instructions.   loratadine 10 MG tablet Commonly known as:  CLARITIN Take 1 tablet (10 mg total) by mouth daily.   omeprazole 40 MG capsule Commonly known as:  PRILOSEC TAKE 1 CAPSULE DAILY AT DINNER TIME What changed:  See the new instructions.   simvastatin 40 MG tablet Commonly known as:  ZOCOR TAKE 1 TABLET EVERY EVENING (NEEDS UPDATED LAB WORK AND APPOINTMENT FOR FUTURE REFILLS)   terazosin 5 MG capsule Commonly known as:  HYTRIN TAKE 1 CAPSULE AT BEDTIME       Diagnostic Studies: Dg Chest 2 View  Result Date: 10/03/2016 CLINICAL DATA:  Interstitial lung disease. Surgery. Preoperative chest x-ray. EXAM: CHEST  2 VIEW COMPARISON:  06/16/2016. FINDINGS: Mediastinum hilar structures are normal. Stable cardiomegaly. Stable chronic interstitial prominence with stable nodularity. Prominent right cardiophrenic fat pad. IMPRESSION: Chronic interstitial lung disease, stable appearance. Stable cardiomegaly. No acute cardiopulmonary disease . Electronically Signed   By: Marcello Moores  Register   On: 10/03/2016 08:33   Dg C-arm 1-60 Min  Result Date: 10/03/2016 CLINICAL DATA:  Status post right total hip arthroplasty. EXAM: OPERATIVE right HIP (WITH PELVIS IF PERFORMED) 2 VIEWS TECHNIQUE: Fluoroscopic spot image(s)  were submitted for interpretation post-operatively. FLUOROSCOPY TIME:  24 seconds. COMPARISON:  Radiographs of June 16, 2016. FINDINGS: Two intraoperative fluoroscopic images of the right hip demonstrate the femoral and acetabular components to be well situated. Expected postoperative changes seen in the surrounding soft tissues. IMPRESSION: Status post right total hip arthroplasty. Electronically Signed   By: Marijo Conception, M.D.   On: 10/03/2016 15:36   Dg Hip Port Unilat With Pelvis 1v Right  Result Date: 10/03/2016 CLINICAL DATA:  Status post right hip replacement EXAM: DG HIP (WITH OR WITHOUT PELVIS) 1V PORT RIGHT COMPARISON:  Intraoperative films from earlier in the same day FINDINGS: Right hip replacement is seen. No acute bony abnormality is noted. Degenerative changes in the lumbar spine are  seen. Air is noted in surgical bed. IMPRESSION: Status post right hip replacement. Electronically Signed   By: Inez Catalina M.D.   On: 10/03/2016 15:58   Dg Hip Operative Unilat W Or W/o Pelvis Right  Result Date: 10/03/2016 CLINICAL DATA:  Status post right total hip arthroplasty. EXAM: OPERATIVE right HIP (WITH PELVIS IF PERFORMED) 2 VIEWS TECHNIQUE: Fluoroscopic spot image(s) were submitted for interpretation post-operatively. FLUOROSCOPY TIME:  24 seconds. COMPARISON:  Radiographs of June 16, 2016. FINDINGS: Two intraoperative fluoroscopic images of the right hip demonstrate the femoral and acetabular components to be well situated. Expected postoperative changes seen in the surrounding soft tissues. IMPRESSION: Status post right total hip arthroplasty. Electronically Signed   By: Marijo Conception, M.D.   On: 10/03/2016 15:36    Disposition: 01-Home or Self Care  Discharge Instructions    Call MD / Call 911    Complete by:  As directed    If you experience chest pain or shortness of breath, CALL 911 and be transported to the hospital emergency room.  If you develope a fever above 101 F, pus (white  drainage) or increased drainage or redness at the wound, or calf pain, call your surgeon's office.   Constipation Prevention    Complete by:  As directed    Drink plenty of fluids.  Prune juice may be helpful.  You may use a stool softener, such as Colace (over the counter) 100 mg twice a day.  Use MiraLax (over the counter) for constipation as needed.   Diet - low sodium heart healthy    Complete by:  As directed    Increase activity slowly as tolerated    Complete by:  As directed       Follow-up Information    Dorna Leitz, MD. Schedule an appointment as soon as possible for a visit in 2 week(s).   Specialty:  Orthopedic Surgery Contact information: [place] [address] [place], [place] Follow up.   Specialty:  Home Health Services Why:  home health physical therapy Contact information: [place] [address] [phone]            Signed: Erlene Senters 10/06/2016, 2:35 PM

## 2016-10-05 NOTE — Progress Notes (Signed)
Patient verbalized understanding of discharge information after provided with copy of discharge paperwork. Patient also provided with signed, printed prescriptons. Patient discharged with 3-n-1 and front wheel walker. Patient's iv removed. Patient take down to front lobby in wheelchair where his wife was ready to drive him home. TED stockings in place at time of discharge.

## 2016-10-05 NOTE — Progress Notes (Signed)
Physical Therapy Treatment Patient Details Name: Marcus Orr Sr. MRN: [id number] DOB: 07/08/1933 Today's Date: 10/05/2016    History of Present Illness Pt is 83 yo male with PMH: colon cancer, TIA, CVA, bilateral TKA who presents for R anterior THA after prolonged OA.     PT Comments    Patient is making good progress with PT.  From a mobility standpoint anticipate patient will be ready for DC home when medically ready.    Follow Up Recommendations  Home health PT     Equipment Recommendations  Rolling walker with 5" wheels;3in1 (PT)    Recommendations for Other Services       Precautions / Restrictions Precautions Precautions: None Restrictions Weight Bearing Restrictions: Yes RLE Weight Bearing: Weight bearing as tolerated    Mobility  Bed Mobility               General bed mobility comments: pt OOB in chair upon arrival  Transfers Overall transfer level: Modified independent Equipment used: Rolling walker (2 wheeled) Transfers: Sit to/from Stand           General transfer comment: increased time; safe hand placement standing and sitting  Ambulation/Gait Ambulation/Gait assistance: Supervision Ambulation Distance (Feet): 200 Feet Assistive device: Rolling walker (2 wheeled) Gait Pattern/deviations: Step-through pattern;Antalgic;Decreased stance time - right;Decreased stride length;Decreased weight shift to right Gait velocity: decreased   General Gait Details: mildly antalgic gait; pt with improved step through and step length symmetry with increased distance; safe use of AD   Stairs Stairs: Yes   Stair Management: One rail Right;Step to pattern;Sideways Number of Stairs: 4 General stair comments: cues for sequencing and technique; supervision for safety  Wheelchair Mobility    Modified Rankin (Stroke Patients Only)       Balance Overall balance assessment: No apparent balance deficits (not formally assessed)                                           Cognition Arousal/Alertness: Awake/alert Behavior During Therapy: WFL for tasks assessed/performed Overall Cognitive Status: Within Functional Limits for tasks assessed                                        Exercises      General Comments General comments (skin integrity, edema, etc.): HEP handout and frequency reviewed      Pertinent Vitals/Pain Pain Assessment: Faces Faces Pain Scale: Hurts a little bit Pain Location: right hip Pain Descriptors / Indicators: Sore Pain Intervention(s): Monitored during session;Repositioned;Ice applied    Home Living                      Prior Function            PT Goals (current goals can now be found in the care plan section) Acute Rehab PT Goals Patient Stated Goal: return home PT Goal Formulation: With patient Time For Goal Achievement: 10/11/16 Potential to Achieve Goals: Good Progress towards PT goals: Progressing toward goals    Frequency    7X/week      PT Plan Current plan remains appropriate    Co-evaluation              AM-PAC PT "6 Clicks" Daily Activity  Outcome Measure  Difficulty turning over in bed (  including adjusting bedclothes, sheets and blankets)?: A Little Difficulty moving from lying on back to sitting on the side of the bed? : A Little Difficulty sitting down on and standing up from a chair with arms (e.g., wheelchair, bedside commode, etc,.)?: A Little Help needed moving to and from a bed to chair (including a wheelchair)?: None Help needed walking in hospital room?: None Help needed climbing 3-5 steps with a railing? : A Little 6 Click Score: 20    End of Session Equipment Utilized During Treatment: Gait belt Activity Tolerance: Patient tolerated treatment well Patient left: with call bell/phone within reach;in chair Nurse Communication: Mobility status PT Visit Diagnosis: Pain;Muscle weakness (generalized) (M62.81);Difficulty  in walking, not elsewhere classified (R26.2) Pain - Right/Left: Right Pain - part of body: Hip     Time: 8563-1497 PT Time Calculation (min) (ACUTE ONLY): 25 min  Charges:  $Gait Training: 8-22 mins $Therapeutic Activity: 8-22 mins                    G Codes:       Earney Navy, PTA Pager: [phone]     Darliss Cheney 10/05/2016, 9:00 AM

## 2016-10-05 NOTE — Care Management Note (Addendum)
Case Management Note  Patient Details  Name: Marcus NED Sr. MRN: [id number] Date of Birth: 05-08-1933  Subjective/Objective:                  TOTAL HIP ARTHROPLASTY ANTERIOR APPROACH (Right) Action/Plan: Discharge planning Expected Discharge Date:  10/05/16               Expected Discharge Plan:  [place]  In-House Referral:     Discharge planning Services  CM Consult  Post Acute Care Choice:  Home Health Choice offered to:  Patient  DME Arranged:  3-N-1, Walker rolling DME Agency:  [place]:  PT [facility] Agency:     Status of Service:  Completed, signed off  If discussed at [place] of Stay Meetings, dates discussed:    Additional Comments: CM spoke with pt to offer choice of home health agency.  Pt chooses [facility]  to render HHPT.  Referral called to [facility]. Pt will be recuperating at [place], [place] [address]. Cm notified [facility] to please deliver the 3n1 and rolling walker to room.  CM has requested Face to Face as pt is Medicare and no home health agency will accept referral until F2F is placed.   Dellie Catholic, RN 10/05/2016, 9:21 AM

## 2016-10-05 NOTE — Progress Notes (Signed)
   PATIENT ID: Marcus Brick Sr.   2 Days Post-Op Procedure(s) (LRB): TOTAL HIP ARTHROPLASTY ANTERIOR APPROACH (Right)  Subjective: Doing very well, hoping to go home today. Minimal pain.  Objective:  Vitals:   10/04/16 1954 10/05/16 0400  BP: (!) 163/64 (!) 161/82  Pulse: 66 72  Resp: 18 18  Temp: 99.1 F (37.3 C) 98.5 F (36.9 C)     R hip dressing c/d/i Wiggles toes, distally NVI  Labs:   Recent Labs  10/04/16 0424 10/05/16 0353  HGB 9.3* 9.0*   Recent Labs  10/04/16 0424 10/05/16 0353  WBC 7.2 10.4  RBC 3.35* 3.29*  HCT 28.9* 28.3*  PLT 230 232   Recent Labs  10/04/16 0424  NA 135  K 3.8  CL 107  CO2 24  BUN 16  CREATININE 1.26*  GLUCOSE 127*  CALCIUM 7.7*    Assessment and Plan: 2 days s/p R THA WBAT  Minimize narcotics D/c home when cleared by PT, today Scripts in chart Fu with Dr. Berenice Primas in 2 weeks  VTE proph: ASA, SCDs

## 2016-10-06 ENCOUNTER — Encounter (HOSPITAL_COMMUNITY): Payer: Self-pay | Admitting: Orthopedic Surgery

## 2016-10-06 DIAGNOSIS — D500 Iron deficiency anemia secondary to blood loss (chronic): Secondary | ICD-10-CM | POA: Diagnosis not present

## 2016-10-06 DIAGNOSIS — J45909 Unspecified asthma, uncomplicated: Secondary | ICD-10-CM | POA: Diagnosis not present

## 2016-10-06 DIAGNOSIS — Z96641 Presence of right artificial hip joint: Secondary | ICD-10-CM | POA: Diagnosis not present

## 2016-10-06 DIAGNOSIS — Z471 Aftercare following joint replacement surgery: Secondary | ICD-10-CM | POA: Diagnosis not present

## 2016-10-06 DIAGNOSIS — C189 Malignant neoplasm of colon, unspecified: Secondary | ICD-10-CM | POA: Diagnosis not present

## 2016-10-06 DIAGNOSIS — I10 Essential (primary) hypertension: Secondary | ICD-10-CM | POA: Diagnosis not present

## 2016-10-06 NOTE — Anesthesia Postprocedure Evaluation (Signed)
Anesthesia Post Note  Patient: Marcus MESCHKE Sr.  Procedure(s) Performed: Procedure(s) (LRB): TOTAL HIP ARTHROPLASTY ANTERIOR APPROACH (Right)     Patient location during evaluation: PACU Anesthesia Type: Spinal Level of consciousness: awake Pain management: satisfactory to patient Vital Signs Assessment: post-procedure vital signs reviewed and stable Respiratory status: spontaneous breathing Cardiovascular status: blood pressure returned to baseline Postop Assessment: no headache and spinal receding Anesthetic complications: no    Last Vitals:  Vitals:   10/04/16 1954 10/05/16 0400  BP: (!) 163/64 (!) 161/82  Pulse: 66 72  Resp: 18 18  Temp: 37.3 C 36.9 C    Last Pain:  Vitals:   10/05/16 0628  TempSrc:   PainSc: Asleep                 [name]

## 2016-10-06 NOTE — Op Note (Signed)
NAME:  Marcus Orr, Marcus Orr                    ACCOUNT NO.:  MEDICAL RECORD NO.:  [id number]  LOCATION:                                 FACILITY:  PHYSICIAN:  Alta Corning, M.D.        DATE OF BIRTH:  DATE OF PROCEDURE:  10/03/2016 DATE OF DISCHARGE:                              OPERATIVE REPORT   PREOPERATIVE DIAGNOSIS:  End-stage degenerative joint disease, right hip with bone-on-bone change.  POSTOPERATIVE DIAGNOSIS:  End-stage degenerative joint disease, right hip with bone-on-bone change.  PROCEDURE PERFORMED: 1. Right total hip replacement with a Corail size 12 stem, a 58 mm     Pinnacle porous-coated cup, +4 neutral liner, and a 36 mm delta     ceramic hip ball +0. 2. Interpretation of multiple intraoperative fluoroscopic images.  SURGEON:  Alta Corning, M.D.  ASSISTANT:  Nehemiah Massed, PA-C.  ANESTHESIA:  Spinal.  BRIEF HISTORY:  [name] is an 83 year old male with a long history of having complaints of right hip pain.  He had been treated conservatively for a prolonged period of time.  After failure of all conservative care, he was taken to the operating room for right total hip replacement.  X-ray showed bone-on-bone change and MRI showed significant effusion with concerns for inflammatory arthritis.  The patient had failed conservative care including injection therapy and had excellent relief with injection therapy.  He was brought to the operating room for right total hip replacement.  DESCRIPTION OF PROCEDURE:  The patient was brought to the operating room.  After adequate anesthesia was obtained with spinal anesthetic, the patient was placed supine on the operating table.  The patient was then placed onto the Hana bed and preoperative x-ray images were taken to ascertain leg length.  At this time, attention was turned to the right hip, where after routine prep and drape, an incision was made for an anterior approach to the hip, subcutaneous tissue down to  the level of the tensor fascia.  The tensor fascia was identified and opened. Then, the muscle was retracted and retractors put in place above and below the femoral neck.  The Bovie was then used to open the capsule. Capsule was tagged.  Capsule was released around anteriorly.  Once this was done, attention was turned towards provisional neck cut. Provisional neck cut was made and head removed.  Acetabulum was identified, sequentially reamed to a level of 57 mm, and a 58 mm porous cup was put in place.  Fluoro was used throughout the reaming process to ensure that we had adequate size, fit, and fill of the cup.  At this point, cup was hammered into place, a hole eliminator placed, and a +4 neutral liner placed.  Attention was turned to the stem.  Retractors put in place for the Hana bed and the patient externally rotated and put into an extended and externally rotated position with the arm behind the neck.  The femur was then elevated out of the wound and the posterior capsule was released.  A rongeur was used to ascertain the angle of the stem, followed by a cookie cutter, followed by a chilli pepper,  followed by sequential rasping up to a level of 12 mm.  A 12 mm placed, calcar reaming was placed, and then a trial was put in place.  Excellent range of motion and stability were achieved and leg lengths appeared to be symmetric to his preoperative images, which interestingly showed him to be slightly long on the operative side.  At this point, attention was turned towards going back to removing the trial components.  The final stem was then hammered into place followed by a +0 ceramic hip ball. Once the ceramic hip ball was placed, the hip was reduced.  Final fluoroscopic images were taken showing symmetric leg length and we did a trial of extension and external rotation that was perfectly stable at this point.  Following this, the hip was irrigated thoroughly and suctioned dry.  The  capsule was closed with 1 Vicryl running, the tensor fascia was closed with 0 Vicryl, the skin with 2-0 Vicryl, and then 3-0 Monocryl subcuticular.  Benzoin and Steri-Strips applied.  A sterile compressive dressing was applied.  The patient was taken to the recovery room and was noted to be in satisfactory condition.  Estimated blood loss for the procedure was about 400 mL and the final can be gotten from the anesthetic record.  Nehemiah Massed, PA-C, assisted throughout the case and was critical in the performance of retraction as well as helping with reduction and closure to limit OR time.     Alta Corning, M.D.     Corliss Skains  D:  10/03/2016  T:  10/03/2016  Job:  [id number]  cc:   Alta Corning, M.D.

## 2016-10-08 DIAGNOSIS — D500 Iron deficiency anemia secondary to blood loss (chronic): Secondary | ICD-10-CM | POA: Diagnosis not present

## 2016-10-08 DIAGNOSIS — J45909 Unspecified asthma, uncomplicated: Secondary | ICD-10-CM | POA: Diagnosis not present

## 2016-10-08 DIAGNOSIS — Z471 Aftercare following joint replacement surgery: Secondary | ICD-10-CM | POA: Diagnosis not present

## 2016-10-08 DIAGNOSIS — C189 Malignant neoplasm of colon, unspecified: Secondary | ICD-10-CM | POA: Diagnosis not present

## 2016-10-08 DIAGNOSIS — I10 Essential (primary) hypertension: Secondary | ICD-10-CM | POA: Diagnosis not present

## 2016-10-08 DIAGNOSIS — Z96641 Presence of right artificial hip joint: Secondary | ICD-10-CM | POA: Diagnosis not present

## 2016-10-09 DIAGNOSIS — I10 Essential (primary) hypertension: Secondary | ICD-10-CM | POA: Diagnosis not present

## 2016-10-09 DIAGNOSIS — Z471 Aftercare following joint replacement surgery: Secondary | ICD-10-CM | POA: Diagnosis not present

## 2016-10-09 DIAGNOSIS — C189 Malignant neoplasm of colon, unspecified: Secondary | ICD-10-CM | POA: Diagnosis not present

## 2016-10-09 DIAGNOSIS — J45909 Unspecified asthma, uncomplicated: Secondary | ICD-10-CM | POA: Diagnosis not present

## 2016-10-09 DIAGNOSIS — Z96641 Presence of right artificial hip joint: Secondary | ICD-10-CM | POA: Diagnosis not present

## 2016-10-09 DIAGNOSIS — D500 Iron deficiency anemia secondary to blood loss (chronic): Secondary | ICD-10-CM | POA: Diagnosis not present

## 2016-10-13 DIAGNOSIS — C189 Malignant neoplasm of colon, unspecified: Secondary | ICD-10-CM | POA: Diagnosis not present

## 2016-10-13 DIAGNOSIS — J45909 Unspecified asthma, uncomplicated: Secondary | ICD-10-CM | POA: Diagnosis not present

## 2016-10-13 DIAGNOSIS — I10 Essential (primary) hypertension: Secondary | ICD-10-CM | POA: Diagnosis not present

## 2016-10-13 DIAGNOSIS — D500 Iron deficiency anemia secondary to blood loss (chronic): Secondary | ICD-10-CM | POA: Diagnosis not present

## 2016-10-13 DIAGNOSIS — Z471 Aftercare following joint replacement surgery: Secondary | ICD-10-CM | POA: Diagnosis not present

## 2016-10-13 DIAGNOSIS — Z96641 Presence of right artificial hip joint: Secondary | ICD-10-CM | POA: Diagnosis not present

## 2016-10-14 DIAGNOSIS — M1611 Unilateral primary osteoarthritis, right hip: Secondary | ICD-10-CM | POA: Diagnosis not present

## 2016-10-16 DIAGNOSIS — J45909 Unspecified asthma, uncomplicated: Secondary | ICD-10-CM | POA: Diagnosis not present

## 2016-10-16 DIAGNOSIS — Z96641 Presence of right artificial hip joint: Secondary | ICD-10-CM | POA: Diagnosis not present

## 2016-10-16 DIAGNOSIS — I10 Essential (primary) hypertension: Secondary | ICD-10-CM | POA: Diagnosis not present

## 2016-10-16 DIAGNOSIS — C189 Malignant neoplasm of colon, unspecified: Secondary | ICD-10-CM | POA: Diagnosis not present

## 2016-10-16 DIAGNOSIS — Z471 Aftercare following joint replacement surgery: Secondary | ICD-10-CM | POA: Diagnosis not present

## 2016-10-16 DIAGNOSIS — D500 Iron deficiency anemia secondary to blood loss (chronic): Secondary | ICD-10-CM | POA: Diagnosis not present

## 2016-10-20 DIAGNOSIS — M1611 Unilateral primary osteoarthritis, right hip: Secondary | ICD-10-CM | POA: Diagnosis not present

## 2016-10-20 DIAGNOSIS — Z96641 Presence of right artificial hip joint: Secondary | ICD-10-CM | POA: Diagnosis not present

## 2016-10-20 DIAGNOSIS — Z96653 Presence of artificial knee joint, bilateral: Secondary | ICD-10-CM | POA: Diagnosis not present

## 2016-10-20 DIAGNOSIS — R2689 Other abnormalities of gait and mobility: Secondary | ICD-10-CM | POA: Diagnosis not present

## 2016-10-22 DIAGNOSIS — R2689 Other abnormalities of gait and mobility: Secondary | ICD-10-CM | POA: Diagnosis not present

## 2016-10-22 DIAGNOSIS — M1611 Unilateral primary osteoarthritis, right hip: Secondary | ICD-10-CM | POA: Diagnosis not present

## 2016-10-22 DIAGNOSIS — Z96653 Presence of artificial knee joint, bilateral: Secondary | ICD-10-CM | POA: Diagnosis not present

## 2016-10-22 DIAGNOSIS — Z96641 Presence of right artificial hip joint: Secondary | ICD-10-CM | POA: Diagnosis not present

## 2016-10-27 DIAGNOSIS — Z96641 Presence of right artificial hip joint: Secondary | ICD-10-CM | POA: Diagnosis not present

## 2016-10-27 DIAGNOSIS — M1611 Unilateral primary osteoarthritis, right hip: Secondary | ICD-10-CM | POA: Diagnosis not present

## 2016-10-27 DIAGNOSIS — Z96653 Presence of artificial knee joint, bilateral: Secondary | ICD-10-CM | POA: Diagnosis not present

## 2016-10-27 DIAGNOSIS — R2689 Other abnormalities of gait and mobility: Secondary | ICD-10-CM | POA: Diagnosis not present

## 2016-10-30 DIAGNOSIS — Z96641 Presence of right artificial hip joint: Secondary | ICD-10-CM | POA: Diagnosis not present

## 2016-10-30 DIAGNOSIS — M1611 Unilateral primary osteoarthritis, right hip: Secondary | ICD-10-CM | POA: Diagnosis not present

## 2016-10-30 DIAGNOSIS — R2689 Other abnormalities of gait and mobility: Secondary | ICD-10-CM | POA: Diagnosis not present

## 2016-10-30 DIAGNOSIS — Z96653 Presence of artificial knee joint, bilateral: Secondary | ICD-10-CM | POA: Diagnosis not present

## 2016-10-31 ENCOUNTER — Encounter: Payer: Self-pay | Admitting: Sports Medicine

## 2016-10-31 ENCOUNTER — Ambulatory Visit (INDEPENDENT_AMBULATORY_CARE_PROVIDER_SITE_OTHER): Payer: Medicare Other | Admitting: Sports Medicine

## 2016-10-31 ENCOUNTER — Ambulatory Visit (INDEPENDENT_AMBULATORY_CARE_PROVIDER_SITE_OTHER): Payer: Medicare Other

## 2016-10-31 VITALS — BP 94/52 | HR 93 | Wt 176.0 lb

## 2016-10-31 DIAGNOSIS — T8484XA Pain due to internal orthopedic prosthetic devices, implants and grafts, initial encounter: Secondary | ICD-10-CM | POA: Diagnosis not present

## 2016-10-31 DIAGNOSIS — R6889 Other general symptoms and signs: Secondary | ICD-10-CM | POA: Diagnosis not present

## 2016-10-31 DIAGNOSIS — D500 Iron deficiency anemia secondary to blood loss (chronic): Secondary | ICD-10-CM | POA: Diagnosis not present

## 2016-10-31 DIAGNOSIS — Y838 Other surgical procedures as the cause of abnormal reaction of the patient, or of later complication, without mention of misadventure at the time of the procedure: Secondary | ICD-10-CM | POA: Diagnosis not present

## 2016-10-31 DIAGNOSIS — Z96641 Presence of right artificial hip joint: Secondary | ICD-10-CM | POA: Diagnosis not present

## 2016-10-31 DIAGNOSIS — D509 Iron deficiency anemia, unspecified: Secondary | ICD-10-CM | POA: Diagnosis not present

## 2016-10-31 DIAGNOSIS — Z96653 Presence of artificial knee joint, bilateral: Secondary | ICD-10-CM

## 2016-10-31 DIAGNOSIS — M25551 Pain in right hip: Secondary | ICD-10-CM | POA: Diagnosis not present

## 2016-10-31 LAB — CBC
HCT: 32.1 % — ABNORMAL LOW (ref 38.5–50.0)
Hemoglobin: 10.3 g/dL — ABNORMAL LOW (ref 13.2–17.1)
MCH: 28.1 pg (ref 27.0–33.0)
MCHC: 32.1 g/dL (ref 32.0–36.0)
MCV: 87.5 fL (ref 80.0–100.0)
MPV: 9.2 fL (ref 7.5–12.5)
Platelets: 330 10*3/uL (ref 140–400)
RBC: 3.67 MIL/uL — ABNORMAL LOW (ref 4.20–5.80)
RDW: 15.2 % — ABNORMAL HIGH (ref 11.0–15.0)
WBC: 6 K/uL (ref 3.8–10.8)

## 2016-10-31 NOTE — Assessment & Plan Note (Addendum)
1 month post right total hip arthroplasty, there is a lucency concerning for periacetabular fracture. Per discussion with radiology we are going to proceed with a CT of the hip, he has an appointment with his orthopedic surgeon tomorrow. Certainly if there is an effusion I will aspirate it. CT is negative, no evidence of periprosthetic fracture, keep close follow-up with orthopedic surgeon tomorrow.

## 2016-10-31 NOTE — Progress Notes (Signed)
  Subjective:    CC: Right hip pain  HPI: 1 month ago this pleasant 83 year old male had an anterior approach right total hip arthroplasty. After surgery he tells me he had the same pain in his groin that he had before surgery. No fevers or chills. Pain is moderate, persistent.  Past medical history:  Negative.  See flowsheet/record as well for more information.  Surgical history: Negative.  See flowsheet/record as well for more information.  Family history: Negative.  See flowsheet/record as well for more information.  Social history: Negative.  See flowsheet/record as well for more information.  Allergies, and medications have been entered into the medical record, reviewed, and no changes needed.   Review of Systems: No fevers, chills, night sweats, weight loss, chest pain, or shortness of breath.   Objective:    General: Well Developed, well nourished, and in no acute distress.  Neuro: Alert and oriented x3, extra-ocular muscles intact, sensation grossly intact.  HEENT: Normocephalic, atraumatic, pupils equal round reactive to light, neck supple, no masses, no lymphadenopathy, thyroid nonpalpable.  Skin: Warm and dry, no rashes. Cardiac: Regular rate and rhythm, no murmurs rubs or gallops, no lower extremity edema.  Respiratory: Clear to auscultation bilaterally. Not using accessory muscles, speaking in full sentences. Right Hip: ROM IR: 60 Deg, ER: 60 Deg, Flexion: 120 Deg, Extension: 100 Deg, Abduction: 45 Deg, Adduction: 45 Deg Strength IR: 5/5, ER: 5/5, Flexion: 5/5, Extension: 5/5, Abduction: 5/5, Adduction: 5/5 Pelvic alignment unremarkable to inspection and palpation. Standing hip rotation and gait without trendelenburg / unsteadiness. Greater trochanter without tenderness to palpation. No tenderness over piriformis. No SI joint tenderness and normal minimal SI movement.  Incision is clean, dry, intact, there is some tenderness over the anterior aspect of the pubic ramus,  as well as the adductor tubercle. Inguinal rings palpated, no evidence of inguinal hernia.  Postoperative x-rays do show what I question to be a peri-acetabular prosthetic fracture.  CT reviewed, no evidence of periprosthetic fracture.  Impression and Recommendations:    History of bilateral knee arthroplasty 1 month post right total hip arthroplasty, there is a lucency concerning for periacetabular fracture. Per discussion with radiology we are going to proceed with a CT of the hip, he has an appointment with his orthopedic surgeon tomorrow. Certainly if there is an effusion I will aspirate it. CT is negative, no evidence of periprosthetic fracture, keep close follow-up with orthopedic surgeon tomorrow.

## 2016-11-01 DIAGNOSIS — M545 Low back pain: Secondary | ICD-10-CM | POA: Diagnosis not present

## 2016-11-01 DIAGNOSIS — M5441 Lumbago with sciatica, right side: Secondary | ICD-10-CM | POA: Diagnosis not present

## 2016-11-01 LAB — IRON AND TIBC
%SAT: 18 % (ref 15–60)
Iron: 47 ug/dL — ABNORMAL LOW (ref 50–180)
TIBC: 265 ug/dL (ref 250–425)
UIBC: 218 ug/dL

## 2016-11-01 LAB — FERRITIN: Ferritin: 56 ng/mL (ref 20–380)

## 2016-11-01 LAB — FOLATE: Folate: 6.9 ng/mL (ref >5.4)

## 2016-11-01 LAB — VITAMIN B12: Vitamin B-12: 214 pg/mL (ref 200–1100)

## 2016-11-01 MED ORDER — FOLIC + B12 800-1000 MCG PO TABS: 1.0000 | ORAL_TABLET | Freq: Every day | ORAL | 0 refills | Status: DC

## 2016-11-01 MED ORDER — FERROUS SULFATE 325 (65 FE) MG PO TBEC: 325.0000 mg | DELAYED_RELEASE_TABLET | Freq: Every day | ORAL | 0 refills | Status: DC

## 2016-11-01 NOTE — Assessment & Plan Note (Signed)
Hb is improved, iron stores are still low, vitamin B12 is low-normal, folate is low-normal.  Continue Iron supplementation.  Adding Oral B12 and folate supplements for 3 months.

## 2016-11-01 NOTE — Addendum Note (Signed)
Addended by: Silverio Decamp on: 11/01/2016 03:28 PM   Modules accepted: Orders

## 2016-11-03 ENCOUNTER — Other Ambulatory Visit: Payer: Self-pay

## 2016-11-03 DIAGNOSIS — D500 Iron deficiency anemia secondary to blood loss (chronic): Secondary | ICD-10-CM

## 2016-11-03 MED ORDER — FERROUS SULFATE 325 (65 FE) MG PO TBEC: 325.0000 mg | DELAYED_RELEASE_TABLET | Freq: Every day | ORAL | 0 refills | Status: DC

## 2016-11-03 MED ORDER — FOLIC + B12 800-1000 MCG PO TABS: 1.0000 | ORAL_TABLET | Freq: Every day | ORAL | 0 refills | Status: AC

## 2016-11-18 DIAGNOSIS — M545 Low back pain: Secondary | ICD-10-CM | POA: Diagnosis not present

## 2016-11-25 DIAGNOSIS — M545 Low back pain: Secondary | ICD-10-CM | POA: Diagnosis not present

## 2016-11-27 DIAGNOSIS — Z96642 Presence of left artificial hip joint: Secondary | ICD-10-CM | POA: Diagnosis not present

## 2016-11-27 DIAGNOSIS — M25552 Pain in left hip: Secondary | ICD-10-CM | POA: Diagnosis not present

## 2016-12-01 DIAGNOSIS — M48061 Spinal stenosis, lumbar region without neurogenic claudication: Secondary | ICD-10-CM | POA: Diagnosis not present

## 2016-12-04 DIAGNOSIS — L570 Actinic keratosis: Secondary | ICD-10-CM | POA: Diagnosis not present

## 2016-12-04 DIAGNOSIS — L820 Inflamed seborrheic keratosis: Secondary | ICD-10-CM | POA: Diagnosis not present

## 2016-12-04 DIAGNOSIS — L814 Other melanin hyperpigmentation: Secondary | ICD-10-CM | POA: Diagnosis not present

## 2016-12-04 DIAGNOSIS — L579 Skin changes due to chronic exposure to nonionizing radiation, unspecified: Secondary | ICD-10-CM | POA: Diagnosis not present

## 2016-12-04 DIAGNOSIS — Z85828 Personal history of other malignant neoplasm of skin: Secondary | ICD-10-CM | POA: Diagnosis not present

## 2016-12-18 DIAGNOSIS — M48061 Spinal stenosis, lumbar region without neurogenic claudication: Secondary | ICD-10-CM | POA: Diagnosis not present

## 2017-01-01 ENCOUNTER — Ambulatory Visit (INDEPENDENT_AMBULATORY_CARE_PROVIDER_SITE_OTHER): Payer: Medicare Other | Admitting: Sports Medicine

## 2017-01-01 DIAGNOSIS — Z23 Encounter for immunization: Secondary | ICD-10-CM

## 2017-01-01 NOTE — Progress Notes (Signed)
Flu shot given today

## 2017-01-09 ENCOUNTER — Other Ambulatory Visit: Payer: Self-pay | Admitting: Sports Medicine

## 2017-01-09 DIAGNOSIS — R05 Cough: Secondary | ICD-10-CM

## 2017-01-09 DIAGNOSIS — R059 Cough, unspecified: Secondary | ICD-10-CM

## 2017-01-30 ENCOUNTER — Encounter: Payer: Self-pay | Admitting: Sports Medicine

## 2017-01-30 DIAGNOSIS — D500 Iron deficiency anemia secondary to blood loss (chronic): Secondary | ICD-10-CM

## 2017-01-30 DIAGNOSIS — E782 Mixed hyperlipidemia: Secondary | ICD-10-CM

## 2017-02-02 ENCOUNTER — Ambulatory Visit: Payer: 59 | Admitting: Sports Medicine

## 2017-02-03 DIAGNOSIS — D509 Iron deficiency anemia, unspecified: Secondary | ICD-10-CM | POA: Diagnosis not present

## 2017-02-03 DIAGNOSIS — D500 Iron deficiency anemia secondary to blood loss (chronic): Secondary | ICD-10-CM | POA: Diagnosis not present

## 2017-02-03 DIAGNOSIS — E782 Mixed hyperlipidemia: Secondary | ICD-10-CM | POA: Diagnosis not present

## 2017-02-03 DIAGNOSIS — L570 Actinic keratosis: Secondary | ICD-10-CM | POA: Diagnosis not present

## 2017-02-04 ENCOUNTER — Other Ambulatory Visit: Payer: Self-pay | Admitting: Sports Medicine

## 2017-02-04 DIAGNOSIS — D500 Iron deficiency anemia secondary to blood loss (chronic): Secondary | ICD-10-CM

## 2017-02-04 LAB — LIPID PANEL W/REFLEX DIRECT LDL
Cholesterol: 106 mg/dL
HDL: 47 mg/dL
LDL Cholesterol (Calc): 42 mg/dL
Non-HDL Cholesterol (Calc): 59 mg/dL
Total CHOL/HDL Ratio: 2.3 (calc)
Triglycerides: 90 mg/dL

## 2017-02-04 LAB — ANEMIA PROFILE B
%SAT: 13 % — ABNORMAL LOW (ref 15–60)
ABS Retic: 30800 {cells}/uL (ref 25000–9000)
Basophils Absolute: 90 {cells}/uL (ref 0–200)
Basophils Relative: 1.1 %
Eosinophils Absolute: 976 {cells}/uL — ABNORMAL HIGH (ref 15–500)
Eosinophils Relative: 11.9 %
Ferritin: 41 ng/mL (ref 20–380)
Folate: >24 ng/mL
HCT: 37.3 % — ABNORMAL LOW (ref 38.5–50.0)
Hemoglobin: 12.3 g/dL — ABNORMAL LOW (ref 13.2–17.1)
Iron: 34 ug/dL — ABNORMAL LOW (ref 50–180)
Lymphs Abs: 1263 cells/uL (ref 850–3900)
MCH: 27.8 pg (ref 27.0–33.0)
MCHC: 33 g/dL (ref 32.0–36.0)
MCV: 84.2 fL (ref 80.0–100.0)
MPV: 10.2 fL (ref 7.5–12.5)
Monocytes Relative: 7.9 %
Neutro Abs: 5223 cells/uL (ref 1500–7800)
Neutrophils Relative %: 63.7 %
Platelets: 323 Thousand/uL (ref 140–400)
RBC: 4.43 10*6/uL (ref 4.20–5.80)
RDW: 14.4 % (ref 11.0–15.0)
Retic Ct Pct: 0.7 %
TIBC: 270 ug/dL (ref 250–425)
Total Lymphocyte: 15.4 %
Vitamin B-12: 1372 pg/mL — ABNORMAL HIGH (ref 200–1100)
WBC mixed population: 648 {cells}/uL (ref 200–950)
WBC: 8.2 10*3/uL (ref 3.8–10.8)

## 2017-02-04 LAB — COMPREHENSIVE METABOLIC PANEL WITH GFR
AG Ratio: 1.6 (calc) (ref 1.0–2.5)
ALT: 9 U/L (ref 9–46)
AST: 13 U/L (ref 10–35)
Albumin: 4.1 g/dL (ref 3.6–5.1)
Alkaline phosphatase (APISO): 65 U/L (ref 40–115)
BUN/Creatinine Ratio: 12 (calc) (ref 6–22)
BUN: 18 mg/dL (ref 7–25)
CO2: 28 mmol/L (ref 20–32)
Calcium: 9.1 mg/dL (ref 8.6–10.3)
Chloride: 107 mmol/L (ref 98–110)
Creat: 1.45 mg/dL — ABNORMAL HIGH (ref 0.70–1.11)
Globulin: 2.6 g/dL (ref 1.9–3.7)
Glucose, Bld: 90 mg/dL (ref 65–99)
Potassium: 4.1 mmol/L (ref 3.5–5.3)
Sodium: 142 mmol/L (ref 135–146)
Total Bilirubin: 0.6 mg/dL (ref 0.2–1.2)
Total Protein: 6.7 g/dL (ref 6.1–8.1)

## 2017-02-04 LAB — HEMOGLOBIN A1C
Hgb A1c MFr Bld: 5.6 % of total Hgb (ref <5.7)
Mean Plasma Glucose: 114 (calc)
eAG (mmol/L): 6.3 (calc)

## 2017-02-04 LAB — TSH: TSH: 2.98 mIU/L (ref 0.40–4.50)

## 2017-02-26 ENCOUNTER — Other Ambulatory Visit: Payer: Self-pay | Admitting: Sports Medicine

## 2017-05-06 DIAGNOSIS — Z961 Presence of intraocular lens: Secondary | ICD-10-CM | POA: Diagnosis not present

## 2017-05-06 DIAGNOSIS — H401132 Primary open-angle glaucoma, bilateral, moderate stage: Secondary | ICD-10-CM | POA: Diagnosis not present

## 2017-05-06 DIAGNOSIS — H524 Presbyopia: Secondary | ICD-10-CM | POA: Diagnosis not present

## 2017-05-11 DIAGNOSIS — Z79899 Other long term (current) drug therapy: Secondary | ICD-10-CM | POA: Diagnosis not present

## 2017-05-11 DIAGNOSIS — I10 Essential (primary) hypertension: Secondary | ICD-10-CM | POA: Diagnosis not present

## 2017-05-11 DIAGNOSIS — Z7982 Long term (current) use of aspirin: Secondary | ICD-10-CM | POA: Diagnosis not present

## 2017-05-11 DIAGNOSIS — I350 Nonrheumatic aortic (valve) stenosis: Secondary | ICD-10-CM | POA: Diagnosis not present

## 2017-05-20 DIAGNOSIS — C182 Malignant neoplasm of ascending colon: Secondary | ICD-10-CM | POA: Diagnosis not present

## 2017-05-20 DIAGNOSIS — D649 Anemia, unspecified: Secondary | ICD-10-CM | POA: Diagnosis not present

## 2017-05-20 DIAGNOSIS — I10 Essential (primary) hypertension: Secondary | ICD-10-CM | POA: Diagnosis not present

## 2017-06-09 ENCOUNTER — Telehealth: Payer: Self-pay

## 2017-06-09 MED ORDER — OSELTAMIVIR PHOSPHATE 75 MG PO CAPS
75.0000 mg | ORAL_CAPSULE | Freq: Two times a day (BID) | ORAL | 0 refills | Status: DC
Start: 2017-06-09 — End: 2017-06-17

## 2017-06-09 NOTE — Telephone Encounter (Signed)
No problem, sending now.

## 2017-06-09 NOTE — Telephone Encounter (Signed)
Marcus Orr's wife called and states she has been waiting on the Tamiflu to be sent to the pharmacy([facility]).   I stated that there is not a message for Tamiflu in his chart. He has weakness, chills off and on and runny nose times 2 days. Denies fever or sweats. I offered and appointment and they refused.

## 2017-06-10 NOTE — Telephone Encounter (Signed)
Pt advised.

## 2017-06-17 ENCOUNTER — Encounter: Payer: Self-pay | Admitting: Sports Medicine

## 2017-06-17 ENCOUNTER — Ambulatory Visit (INDEPENDENT_AMBULATORY_CARE_PROVIDER_SITE_OTHER): Payer: Medicare Other | Admitting: Sports Medicine

## 2017-06-17 ENCOUNTER — Ambulatory Visit (INDEPENDENT_AMBULATORY_CARE_PROVIDER_SITE_OTHER): Payer: Medicare Other

## 2017-06-17 DIAGNOSIS — D500 Iron deficiency anemia secondary to blood loss (chronic): Secondary | ICD-10-CM

## 2017-06-17 DIAGNOSIS — R918 Other nonspecific abnormal finding of lung field: Secondary | ICD-10-CM

## 2017-06-17 DIAGNOSIS — J4541 Moderate persistent asthma with (acute) exacerbation: Secondary | ICD-10-CM

## 2017-06-17 DIAGNOSIS — R05 Cough: Secondary | ICD-10-CM | POA: Diagnosis not present

## 2017-06-17 LAB — POCT HEMOGLOBIN: Hemoglobin: 10 g/dL — AB (ref 14.1–18.1)

## 2017-06-17 MED ORDER — METHYLPREDNISOLONE SODIUM SUCC 125 MG IJ SOLR
125.0000 mg | Freq: Once | INTRAMUSCULAR | Status: AC
  Administered 2017-06-17: 125 mg via INTRAMUSCULAR

## 2017-06-17 MED ORDER — PREDNISONE 50 MG PO TABS: ORAL_TABLET | ORAL | 0 refills | Status: DC

## 2017-06-17 MED ORDER — CEFTRIAXONE SODIUM 1 G IJ SOLR
1.0000 g | Freq: Once | INTRAMUSCULAR | Status: AC
  Administered 2017-06-17: 1 g via INTRAMUSCULAR

## 2017-06-17 MED ORDER — AZITHROMYCIN 250 MG PO TABS: ORAL_TABLET | ORAL | 0 refills | Status: DC

## 2017-06-17 NOTE — Addendum Note (Signed)
Addended by: Elizabeth Sauer on: 06/17/2017 11:59 AM   Modules accepted: Orders

## 2017-06-17 NOTE — Assessment & Plan Note (Signed)
Worsening of symptoms, coarse sounds in the right lung fields, question pneumonia. Rocephin 1 g, Solu-Medrol 125 intramuscular. Azithromycin, prednisone, chest x-ray. Keep close follow-up in a week or 2.

## 2017-06-17 NOTE — Progress Notes (Signed)
Subjective:    CC: Shortness of breath and cough  HPI: 84 year old male, history of asthmatic bronchitis, more recently he had some fevers and chills, shortness of breath and cough, we called in Tamiflu as he was 2 days into his symptomatology, he had what appeared to be an allergic reaction to Tamiflu, felt some shortness of breath when taking Tamiflu, stopped it and felt significantly better, tried it again a couple of days later and felt the return of shortness of breath more so than his current illness.  He is coughing, bringing up productive sputum, no blood.  Feels very weak.  No headaches, visual changes, chest pain, no GI symptoms, no skin rash.  I reviewed the past medical history, family history, social history, surgical history, and allergies today and no changes were needed.  Please see the problem list section below in epic for further details.  Past Medical History: Past Medical History:  Diagnosis Date  . Aortic stenosis    mild AS 09/2015 echo  . Aortic valve disease    mild  . Asthma    as child  . Basal ganglia hemorrhage (HCC)    left basal ganlia intraparenchymal hemoorhage 06/17/16 (in the setting of HTN emergency)  . Colon cancer ([facility]) 10/10/2015  . GERD (gastroesophageal reflux disease)   . Hyperlipidemia   . Hypertension   . Hypothyroidism   . Iron deficiency anemia due to chronic blood loss 10/02/2015  . Lower GI bleed 10/02/2015  . Skin cancer   . Thyroid disease   . TIA (transient ischemic attack)    Past Surgical History: Past Surgical History:  Procedure Laterality Date  . COLON RESECTION     ca  . EYE SURGERY    . JOINT REPLACEMENT     bil  . REPLACEMENT TOTAL KNEE    . TOTAL HIP ARTHROPLASTY Right 10/03/2016  . TOTAL HIP ARTHROPLASTY Right 10/03/2016   Procedure: TOTAL HIP ARTHROPLASTY ANTERIOR APPROACH;  Surgeon: Dorna Leitz, MD;  Location: [facility];  Service: Orthopedics;  Laterality: Right;   Social History: Social History   Socioeconomic  History  . Marital status: Married    Spouse name: None  . Number of children: None  . Years of education: None  . Highest education level: None  Social Needs  . Financial resource strain: None  . Food insecurity - worry: None  . Food insecurity - inability: None  . Transportation needs - medical: None  . Transportation needs - non-medical: None  Occupational History  . None  Tobacco Use  . Smoking status: Never Smoker  . Smokeless tobacco: Never Used  Substance and Sexual Activity  . Alcohol use: No  . Drug use: No  . Sexual activity: Yes  Other Topics Concern  . None  Social History Narrative  . None   Family History: No family history on file. Allergies: Allergies  Allergen Reactions  . No Known Allergies    Medications: See med rec.  Review of Systems: No fevers, chills, night sweats, weight loss, chest pain, or shortness of breath.   Objective:    General: Well Developed, well nourished, and in no acute distress.  Neuro: Alert and oriented x3, extra-ocular muscles intact, sensation grossly intact.  HEENT: Normocephalic, atraumatic, pupils equal round reactive to light, neck supple, no masses, no lymphadenopathy, thyroid nonpalpable.  Oropharynx, nasopharynx, ear canals unremarkable Skin: Warm and dry, no rashes. Cardiac: Regular rate and rhythm, no murmurs rubs or gallops, no lower extremity edema.  Respiratory: Coarse sounds  with poor air movement in the right lung field not using accessory muscles, speaking in full sentences.  Sign Medrol 125, Rocephin 1 g given intramuscular  Impression and Recommendations:    Asthmatic bronchitis Worsening of symptoms, coarse sounds in the right lung fields, question pneumonia. Rocephin 1 g, Solu-Medrol 125 intramuscular. Azithromycin, prednisone, chest x-ray. Keep close follow-up in a week or 2.  Iron deficiency anemia due to chronic blood loss Rechecking fingerstick  hemoglobin.  ___________________________________________ Gwen Her. Dianah Field, M.D., ABFM., CAQSM. Primary Care and [place] Instructor of [place] of [facility] of [employer]

## 2017-06-17 NOTE — Assessment & Plan Note (Signed)
Rechecking fingerstick hemoglobin.

## 2017-06-24 ENCOUNTER — Ambulatory Visit (INDEPENDENT_AMBULATORY_CARE_PROVIDER_SITE_OTHER): Payer: Medicare Other | Admitting: Sports Medicine

## 2017-06-24 ENCOUNTER — Encounter: Payer: Self-pay | Admitting: Sports Medicine

## 2017-06-24 DIAGNOSIS — J209 Acute bronchitis, unspecified: Secondary | ICD-10-CM

## 2017-06-24 DIAGNOSIS — J4541 Moderate persistent asthma with (acute) exacerbation: Secondary | ICD-10-CM | POA: Diagnosis not present

## 2017-06-24 DIAGNOSIS — K922 Gastrointestinal hemorrhage, unspecified: Secondary | ICD-10-CM | POA: Diagnosis not present

## 2017-06-24 DIAGNOSIS — R05 Cough: Secondary | ICD-10-CM

## 2017-06-24 DIAGNOSIS — R059 Cough, unspecified: Secondary | ICD-10-CM

## 2017-06-24 LAB — POCT HEMOGLOBIN: Hemoglobin: 11.4 g/dL — AB (ref 14.1–18.1)

## 2017-06-24 MED ORDER — BUDESONIDE-FORMOTEROL FUMARATE 160-4.5 MCG/ACT IN AERO: 1.0000 | INHALATION_SPRAY | Freq: Two times a day (BID) | RESPIRATORY_TRACT | 3 refills | Status: DC

## 2017-06-24 MED ORDER — ALBUTEROL SULFATE HFA 108 (90 BASE) MCG/ACT IN AERS: 2.0000 | INHALATION_SPRAY | Freq: Four times a day (QID) | RESPIRATORY_TRACT | 3 refills | Status: DC | PRN

## 2017-06-24 NOTE — Progress Notes (Signed)
Subjective:    CC: Follow-up  HPI: Marcus Orr returns, he is a pleasant 84 year old male, we treated him at the last visit aggressively for pneumonia, he has improved considerably.  Hemoglobin was down to 10 at the last visit but is up significantly today, he still feels a bit fatigued but overall much better.  Colonoscopy is coming up in a week or 2.  I reviewed the past medical history, family history, social history, surgical history, and allergies today and no changes were needed.  Please see the problem list section below in epic for further details.  Past Medical History: Past Medical History:  Diagnosis Date  . Aortic stenosis    mild AS 09/2015 echo  . Aortic valve disease    mild  . Asthma    as child  . Basal ganglia hemorrhage (HCC)    left basal ganlia intraparenchymal hemoorhage 06/17/16 (in the setting of HTN emergency)  . Colon cancer ([place]) 10/10/2015  . GERD (gastroesophageal reflux disease)   . Hyperlipidemia   . Hypertension   . Hypothyroidism   . Iron deficiency anemia due to chronic blood loss 10/02/2015  . Lower GI bleed 10/02/2015  . Skin cancer   . Thyroid disease   . TIA (transient ischemic attack)    Past Surgical History: Past Surgical History:  Procedure Laterality Date  . COLON RESECTION     ca  . EYE SURGERY    . JOINT REPLACEMENT     bil  . REPLACEMENT TOTAL KNEE    . TOTAL HIP ARTHROPLASTY Right 10/03/2016  . TOTAL HIP ARTHROPLASTY Right 10/03/2016   Procedure: TOTAL HIP ARTHROPLASTY ANTERIOR APPROACH;  Surgeon: Dorna Leitz, MD;  Location: [facility];  Service: Orthopedics;  Laterality: Right;   Social History: Social History   Socioeconomic History  . Marital status: Married    Spouse name: None  . Number of children: None  . Years of education: None  . Highest education level: None  Social Needs  . Financial resource strain: None  . Food insecurity - worry: None  . Food insecurity - inability: None  . Transportation needs - medical: None    . Transportation needs - non-medical: None  Occupational History  . None  Tobacco Use  . Smoking status: Never Smoker  . Smokeless tobacco: Never Used  Substance and Sexual Activity  . Alcohol use: No  . Drug use: No  . Sexual activity: Yes  Other Topics Concern  . None  Social History Narrative  . None   Family History: No family history on file. Allergies: Allergies  Allergen Reactions  . Tamiflu [Oseltamivir] Shortness Of Breath    Actual allergic reaction to Tamiflu   Medications: See med rec.  Review of Systems: No fevers, chills, night sweats, weight loss, chest pain, or shortness of breath.   Objective:    General: Well Developed, well nourished, and in no acute distress.  Neuro: Alert and oriented x3, extra-ocular muscles intact, sensation grossly intact.  HEENT: Normocephalic, atraumatic, pupils equal round reactive to light, neck supple, no masses, no lymphadenopathy, thyroid nonpalpable.  Skin: Warm and dry, no rashes. Cardiac: Regular rate and rhythm, no murmurs rubs or gallops, no lower extremity edema.  Respiratory: Much better air movement.  Clear to auscultation bilaterally. Not using accessory muscles, speaking in full sentences.  Impression and Recommendations:    Lower GI bleed Concern for a new GI bleed, hemoglobin has dropped again, he is feeling fatigued. He does have an appointment coming up soon  for repeat colonoscopy with digestive health specialists.   Asthmatic bronchitis Fantastic response to Rocephin, Solu-Medrol, azithromycin, prednisone, chest x-ray did show pneumonia. Much better air movement and feeling much better today as well. Does need a refill on his inhalers. ___________________________________________ Gwen Her. Dianah Field, M.D., ABFM., CAQSM. Primary Care and [place] Instructor of [place] of [facility] of Medicine

## 2017-06-24 NOTE — Assessment & Plan Note (Signed)
Fantastic response to Rocephin, Solu-Medrol, azithromycin, prednisone, chest x-ray did show pneumonia. Much better air movement and feeling much better today as well. Does need a refill on his inhalers.

## 2017-06-24 NOTE — Assessment & Plan Note (Signed)
Concern for a new GI bleed, hemoglobin has dropped again, he is feeling fatigued. He does have an appointment coming up soon for repeat colonoscopy with digestive health specialists.

## 2017-07-13 ENCOUNTER — Other Ambulatory Visit: Payer: Self-pay | Admitting: Sports Medicine

## 2017-07-13 ENCOUNTER — Telehealth: Payer: Self-pay | Admitting: Sports Medicine

## 2017-07-13 NOTE — Telephone Encounter (Signed)
Mr. Perleberg left his [facility] bill for me.  I called [facility] at [phone] and spoke w/Jessie.  The three test that were denied were Folic Acid, Vitamin Y60 and A1C.  I gave two diagnosis codes to be added and had it refiled to his nsurance.  Those codes are D50.9 and L57.0.  We need to allow 45 days for reprocessing from today.  WB

## 2017-07-29 ENCOUNTER — Ambulatory Visit (INDEPENDENT_AMBULATORY_CARE_PROVIDER_SITE_OTHER): Payer: Medicare Other

## 2017-07-29 ENCOUNTER — Encounter: Payer: Self-pay | Admitting: Sports Medicine

## 2017-07-29 ENCOUNTER — Ambulatory Visit (INDEPENDENT_AMBULATORY_CARE_PROVIDER_SITE_OTHER): Payer: Medicare Other | Admitting: Sports Medicine

## 2017-07-29 DIAGNOSIS — J4541 Moderate persistent asthma with (acute) exacerbation: Secondary | ICD-10-CM

## 2017-07-29 DIAGNOSIS — R0602 Shortness of breath: Secondary | ICD-10-CM | POA: Diagnosis not present

## 2017-07-29 DIAGNOSIS — K922 Gastrointestinal hemorrhage, unspecified: Secondary | ICD-10-CM

## 2017-07-29 DIAGNOSIS — R05 Cough: Secondary | ICD-10-CM | POA: Diagnosis not present

## 2017-07-29 DIAGNOSIS — J449 Chronic obstructive pulmonary disease, unspecified: Secondary | ICD-10-CM

## 2017-07-29 DIAGNOSIS — R059 Cough, unspecified: Secondary | ICD-10-CM

## 2017-07-29 LAB — POCT HEMOGLOBIN: Hemoglobin: 12.5 g/dL — AB (ref 14.1–18.1)

## 2017-07-29 MED ORDER — LORATADINE 10 MG PO TABS
10.0000 mg | ORAL_TABLET | Freq: Every day | ORAL | 3 refills | Status: DC
Start: 2017-07-29 — End: 2019-01-24

## 2017-07-29 MED ORDER — MONTELUKAST SODIUM 10 MG PO TABS: 10.0000 mg | ORAL_TABLET | Freq: Every day | ORAL | 3 refills | Status: DC

## 2017-07-29 MED ORDER — BUDESONIDE-FORMOTEROL FUMARATE 160-4.5 MCG/ACT IN AERO: 2.0000 | INHALATION_SPRAY | Freq: Two times a day (BID) | RESPIRATORY_TRACT | 11 refills | Status: DC

## 2017-07-29 NOTE — Progress Notes (Signed)
Subjective:    CC: Trouble breathing  HPI: Marcus Orr is a pleasant 84 year old male with asthmatic bronchitis, historically he is well controlled on Symbicort, on further questioning he has not been taking his Symbicort, but has simply been using albuterol daily.  Once the pollen started to come out his symptoms worsen significantly, coughing, wheezing, mild mucus production without chest pain, or constitutional symptoms.  In addition he needs a refill on his levothyroxine.  GI bleed: Colonoscopy coming up in 2 weeks.  I reviewed the past medical history, family history, social history, surgical history, and allergies today and no changes were needed.  Please see the problem list section below in epic for further details.  Past Medical History: Past Medical History:  Diagnosis Date  . Aortic stenosis    mild AS 09/2015 echo  . Aortic valve disease    mild  . Asthma    as child  . Basal ganglia hemorrhage (HCC)    left basal ganlia intraparenchymal hemoorhage 06/17/16 (in the setting of HTN emergency)  . Colon cancer ([place]) 10/10/2015  . GERD (gastroesophageal reflux disease)   . Hyperlipidemia   . Hypertension   . Hypothyroidism   . Iron deficiency anemia due to chronic blood loss 10/02/2015  . Lower GI bleed 10/02/2015  . Skin cancer   . Thyroid disease   . TIA (transient ischemic attack)    Past Surgical History: Past Surgical History:  Procedure Laterality Date  . COLON RESECTION     ca  . EYE SURGERY    . JOINT REPLACEMENT     bil  . REPLACEMENT TOTAL KNEE    . TOTAL HIP ARTHROPLASTY Right 10/03/2016  . TOTAL HIP ARTHROPLASTY Right 10/03/2016   Procedure: TOTAL HIP ARTHROPLASTY ANTERIOR APPROACH;  Surgeon: Dorna Leitz, MD;  Location: [place];  Service: Orthopedics;  Laterality: Right;   Social History: Social History   Socioeconomic History  . Marital status: Married    Spouse name: Not on file  . Number of children: Not on file  . Years of education: Not on file  .  Highest education level: Not on file  Occupational History  . Not on file  Social Needs  . Financial resource strain: Not on file  . Food insecurity:    Worry: Not on file    Inability: Not on file  . Transportation needs:    Medical: Not on file    Non-medical: Not on file  Tobacco Use  . Smoking status: Never Smoker  . Smokeless tobacco: Never Used  Substance and Sexual Activity  . Alcohol use: No  . Drug use: No  . Sexual activity: Yes  Lifestyle  . Physical activity:    Days per week: Not on file    Minutes per session: Not on file  . Stress: Not on file  Relationships  . Social connections:    Talks on phone: Not on file    Gets together: Not on file    Attends religious service: Not on file    Active member of club or organization: Not on file    Attends meetings of clubs or organizations: Not on file    Relationship status: Not on file  Other Topics Concern  . Not on file  Social History Narrative  . Not on file   Family History: No family history on file. Allergies: Allergies  Allergen Reactions  . Tamiflu [Oseltamivir] Shortness Of Breath    Actual allergic reaction to Tamiflu   Medications: See med  rec.  Review of Systems: No fevers, chills, night sweats, weight loss, chest pain, or shortness of breath.   Objective:    General: Well Developed, well nourished, and in no acute distress.  Neuro: Alert and oriented x3, extra-ocular muscles intact, sensation grossly intact.  HEENT: Normocephalic, atraumatic, pupils equal round reactive to light, neck supple, no masses, no lymphadenopathy, thyroid nonpalpable.  Skin: Warm and dry, no rashes. Cardiac: Regular rate and rhythm, no murmurs rubs or gallops, no lower extremity edema.  Respiratory: Somewhat coarse sounds with expiratory wheezes in the left upper lung field. Not using accessory muscles, speaking in full sentences.  Hemoglobin fingerstick has improved to 12.5.  Impression and Recommendations:     Asthmatic bronchitis Increasing shortness of breath, has not been using Symbicort daily. He will restart Symbicort, 2 puffs twice a day. Advised to only use albuterol for rescue purposes. Adding Claritin, Singulair during this very yellow time of the year. Chest x-ray. Return to see me next week. If he feels better we will probably have him his second pneumonia vaccine.  Lower GI bleed Never actually saw his gastroenterologist, getting a repeat fingerstick hemoglobin, last time it was up to 11.4 from 10.  I spent 25 minutes with this patient, greater than 50% was face-to-face time counseling regarding the above diagnoses ___________________________________________ Gwen Her. Dianah Field, M.D., ABFM., CAQSM. Primary Care and [place] Instructor of [place] of [facility] of Medicine

## 2017-07-29 NOTE — Assessment & Plan Note (Signed)
Never actually saw his gastroenterologist, getting a repeat fingerstick hemoglobin, last time it was up to 11.4 from 10.

## 2017-07-29 NOTE — Assessment & Plan Note (Addendum)
Increasing shortness of breath, has not been using Symbicort daily. He will restart Symbicort, 2 puffs twice a day. Advised to only use albuterol for rescue purposes. Adding Claritin, Singulair during this very yellow time of the year. Chest x-ray. Return to see me next week. If he feels better we will probably have him his second pneumonia vaccine.

## 2017-07-30 MED ORDER — MOXIFLOXACIN HCL 400 MG PO TABS: 400.0000 mg | ORAL_TABLET | Freq: Every day | ORAL | 0 refills | Status: AC

## 2017-07-30 NOTE — Assessment & Plan Note (Signed)
He had some increasing shortness of breath but had been using Symbicort as a rescue and albuterol daily. Continue Claritin, Singulair, persistent infiltrates bilaterally, has had a couple of courses of doxycycline, azithromycin, switching to moxifloxacin for 10 days. Return to see me as previously discussed, at the follow-up visit we will do a second pneumococcal vaccine.

## 2017-08-02 ENCOUNTER — Other Ambulatory Visit: Payer: Self-pay | Admitting: Sports Medicine

## 2017-08-02 DIAGNOSIS — J4541 Moderate persistent asthma with (acute) exacerbation: Secondary | ICD-10-CM

## 2017-08-02 MED ORDER — PREDNISONE 50 MG PO TABS: ORAL_TABLET | ORAL | 0 refills | Status: DC

## 2017-08-02 MED ORDER — AZITHROMYCIN 250 MG PO TABS: ORAL_TABLET | ORAL | 0 refills | Status: DC

## 2017-08-02 NOTE — Telephone Encounter (Signed)
Persistence of symptoms in spite of a week of appropriate symbicort use.  CXR showed some persistent pneumonia. Adding 2nd course prednisone and azithromycin.  Patient will move his followup this coming week up to Friday.  If persistence of symptoms we will add spiriva and likely CXR again a week later.  If infiltrates still present we will need to consider chest CT to exclude obstructive pneumonia.

## 2017-08-02 NOTE — Assessment & Plan Note (Signed)
Persistence of symptoms in spite of a week of appropriate symbicort use.  CXR showed some persistent pneumonia. Adding 2nd course prednisone and azithromycin.  Patient will move his followup this coming week up to Friday.  If persistence of symptoms we will add spiriva and likely CXR again a week later.  If infiltrates still present we will need to consider chest CT to exclude obstructive pneumonia.

## 2017-08-03 ENCOUNTER — Encounter: Payer: Self-pay | Admitting: Sports Medicine

## 2017-08-05 ENCOUNTER — Ambulatory Visit: Payer: 59 | Admitting: Sports Medicine

## 2017-08-07 ENCOUNTER — Ambulatory Visit (INDEPENDENT_AMBULATORY_CARE_PROVIDER_SITE_OTHER): Payer: Medicare Other | Admitting: Sports Medicine

## 2017-08-07 ENCOUNTER — Encounter: Payer: Self-pay | Admitting: Sports Medicine

## 2017-08-07 DIAGNOSIS — J4541 Moderate persistent asthma with (acute) exacerbation: Secondary | ICD-10-CM

## 2017-08-07 NOTE — Progress Notes (Signed)
Subjective:    CC: Follow-up  HPI: Asthmatic bronchitis: Symptoms now resolved after a burst of prednisone, he is now using his Symbicort correctly as well.  Feels much better.  I reviewed the past medical history, family history, social history, surgical history, and allergies today and no changes were needed.  Please see the problem list section below in epic for further details.  Past Medical History: Past Medical History:  Diagnosis Date  . Aortic stenosis    mild AS 09/2015 echo  . Aortic valve disease    mild  . Asthma    as child  . Basal ganglia hemorrhage (HCC)    left basal ganlia intraparenchymal hemoorhage 06/17/16 (in the setting of HTN emergency)  . Colon cancer ([place]) 10/10/2015  . GERD (gastroesophageal reflux disease)   . Hyperlipidemia   . Hypertension   . Hypothyroidism   . Iron deficiency anemia due to chronic blood loss 10/02/2015  . Lower GI bleed 10/02/2015  . Skin cancer   . Thyroid disease   . TIA (transient ischemic attack)    Past Surgical History: Past Surgical History:  Procedure Laterality Date  . COLON RESECTION     ca  . EYE SURGERY    . JOINT REPLACEMENT     bil  . REPLACEMENT TOTAL KNEE    . TOTAL HIP ARTHROPLASTY Right 10/03/2016  . TOTAL HIP ARTHROPLASTY Right 10/03/2016   Procedure: TOTAL HIP ARTHROPLASTY ANTERIOR APPROACH;  Surgeon: Dorna Leitz, MD;  Location: [place];  Service: Orthopedics;  Laterality: Right;   Social History: Social History   Socioeconomic History  . Marital status: Married    Spouse name: Not on file  . Number of children: Not on file  . Years of education: Not on file  . Highest education level: Not on file  Occupational History  . Not on file  Social Needs  . Financial resource strain: Not on file  . Food insecurity:    Worry: Not on file    Inability: Not on file  . Transportation needs:    Medical: Not on file    Non-medical: Not on file  Tobacco Use  . Smoking status: Never Smoker  . Smokeless  tobacco: Never Used  Substance and Sexual Activity  . Alcohol use: No  . Drug use: No  . Sexual activity: Yes  Lifestyle  . Physical activity:    Days per week: Not on file    Minutes per session: Not on file  . Stress: Not on file  Relationships  . Social connections:    Talks on phone: Not on file    Gets together: Not on file    Attends religious service: Not on file    Active member of club or organization: Not on file    Attends meetings of clubs or organizations: Not on file    Relationship status: Not on file  Other Topics Concern  . Not on file  Social History Narrative  . Not on file   Family History: No family history on file. Allergies: Allergies  Allergen Reactions  . Tamiflu [Oseltamivir] Shortness Of Breath    Actual allergic reaction to Tamiflu   Medications: See med rec.  Review of Systems: No fevers, chills, night sweats, weight loss, chest pain, or shortness of breath.   Objective:    General: Well Developed, well nourished, and in no acute distress.  Neuro: Alert and oriented x3, extra-ocular muscles intact, sensation grossly intact.  HEENT: Normocephalic, atraumatic, pupils equal round  reactive to light, neck supple, no masses, no lymphadenopathy, thyroid nonpalpable.  Skin: Warm and dry, no rashes. Cardiac: Regular rate and rhythm, no murmurs rubs or gallops, no lower extremity edema.  Respiratory: Clear to auscultation bilaterally. Not using accessory muscles, speaking in full sentences.  Impression and Recommendations:    Asthmatic bronchitis Finally improved after a burst of prednisone, and education to use his Symbicort correctly. Continue Singulair, return as needed. ___________________________________________ Gwen Her. Dianah Field, M.D., ABFM., CAQSM. Primary Care and [place] Instructor of [place] of [facility] of Medicine

## 2017-08-07 NOTE — Assessment & Plan Note (Signed)
Finally improved after a burst of prednisone, and education to use his Symbicort correctly. Continue Singulair, return as needed.

## 2017-08-09 ENCOUNTER — Encounter: Payer: Self-pay | Admitting: Sports Medicine

## 2017-08-10 MED ORDER — LEVOTHYROXINE SODIUM 75 MCG PO TABS: 75.0000 ug | ORAL_TABLET | Freq: Every day | ORAL | 1 refills | Status: DC

## 2017-08-13 DIAGNOSIS — C189 Malignant neoplasm of colon, unspecified: Secondary | ICD-10-CM | POA: Diagnosis not present

## 2017-08-13 DIAGNOSIS — K6389 Other specified diseases of intestine: Secondary | ICD-10-CM | POA: Diagnosis not present

## 2017-08-13 DIAGNOSIS — K5730 Diverticulosis of large intestine without perforation or abscess without bleeding: Secondary | ICD-10-CM | POA: Diagnosis not present

## 2017-08-13 DIAGNOSIS — C180 Malignant neoplasm of cecum: Secondary | ICD-10-CM | POA: Diagnosis not present

## 2017-08-13 DIAGNOSIS — D509 Iron deficiency anemia, unspecified: Secondary | ICD-10-CM | POA: Diagnosis not present

## 2017-08-13 DIAGNOSIS — K449 Diaphragmatic hernia without obstruction or gangrene: Secondary | ICD-10-CM | POA: Diagnosis not present

## 2017-08-28 IMAGING — CR DG KNEE COMPLETE 4+V*R*
4 series · 4 of 4 positions shown · non-contrast
Comparison: None.

CLINICAL DATA: Followup of right total knee replacement

EXAM:
RIGHT KNEE - COMPLETE 4+ VIEW

[knee ap]
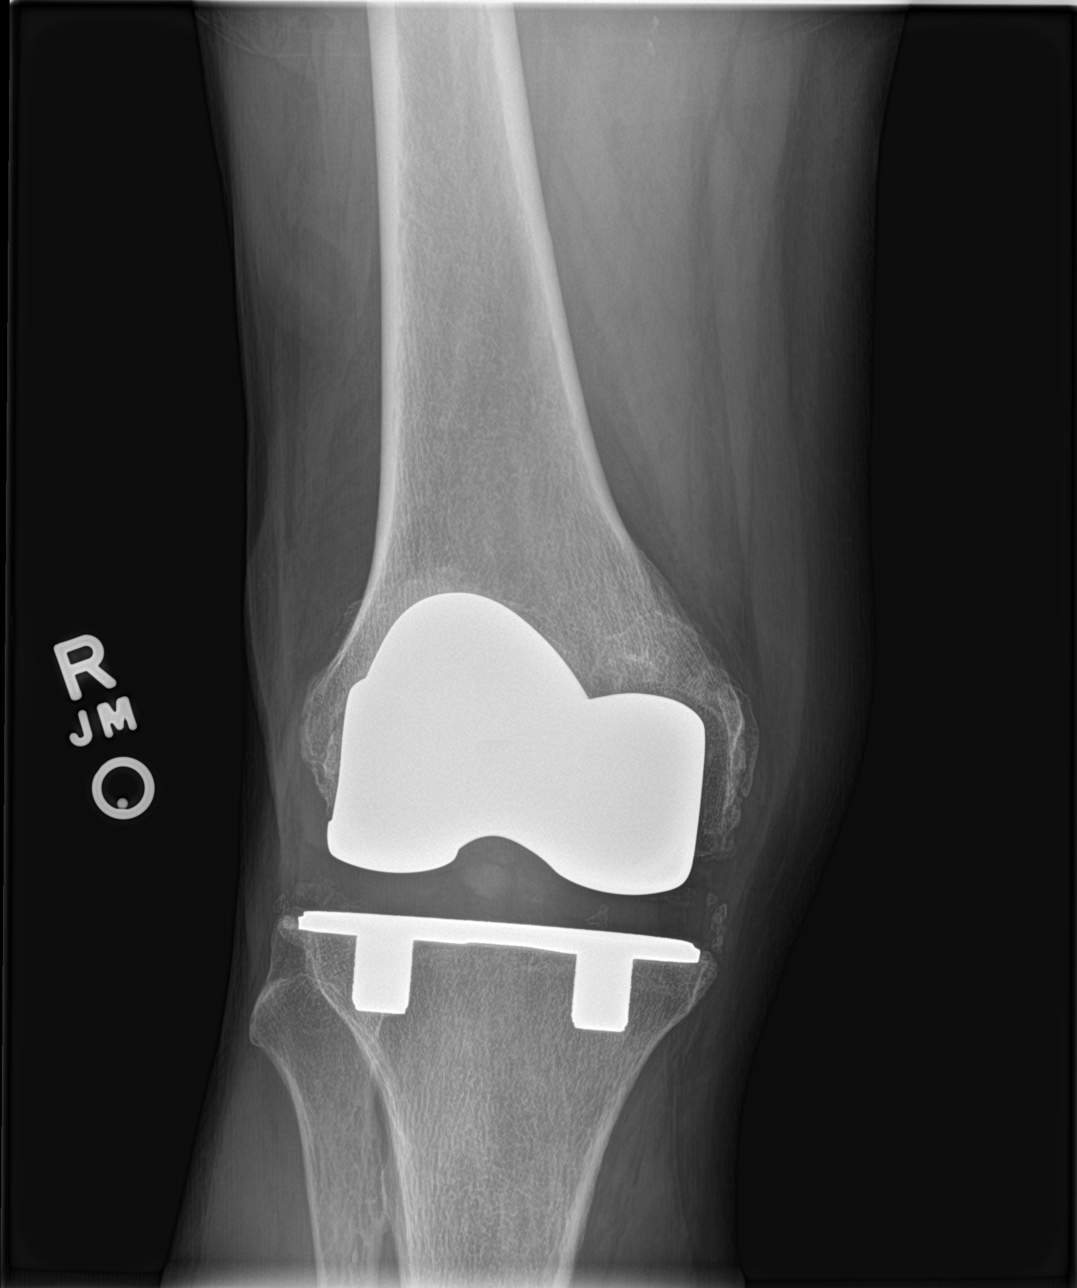

[tunnel]
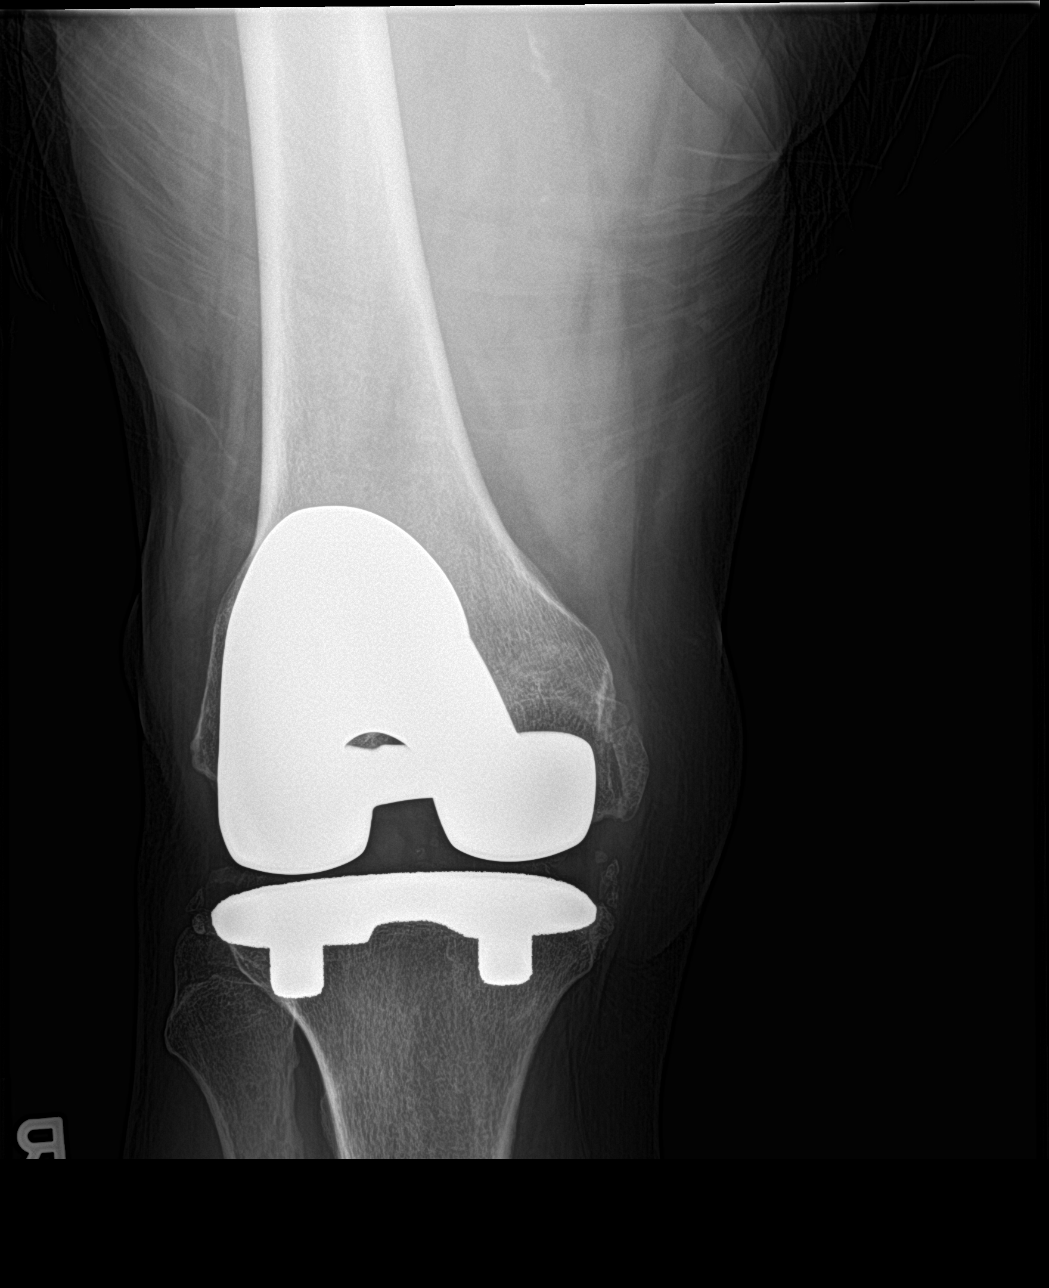

[knee lat]
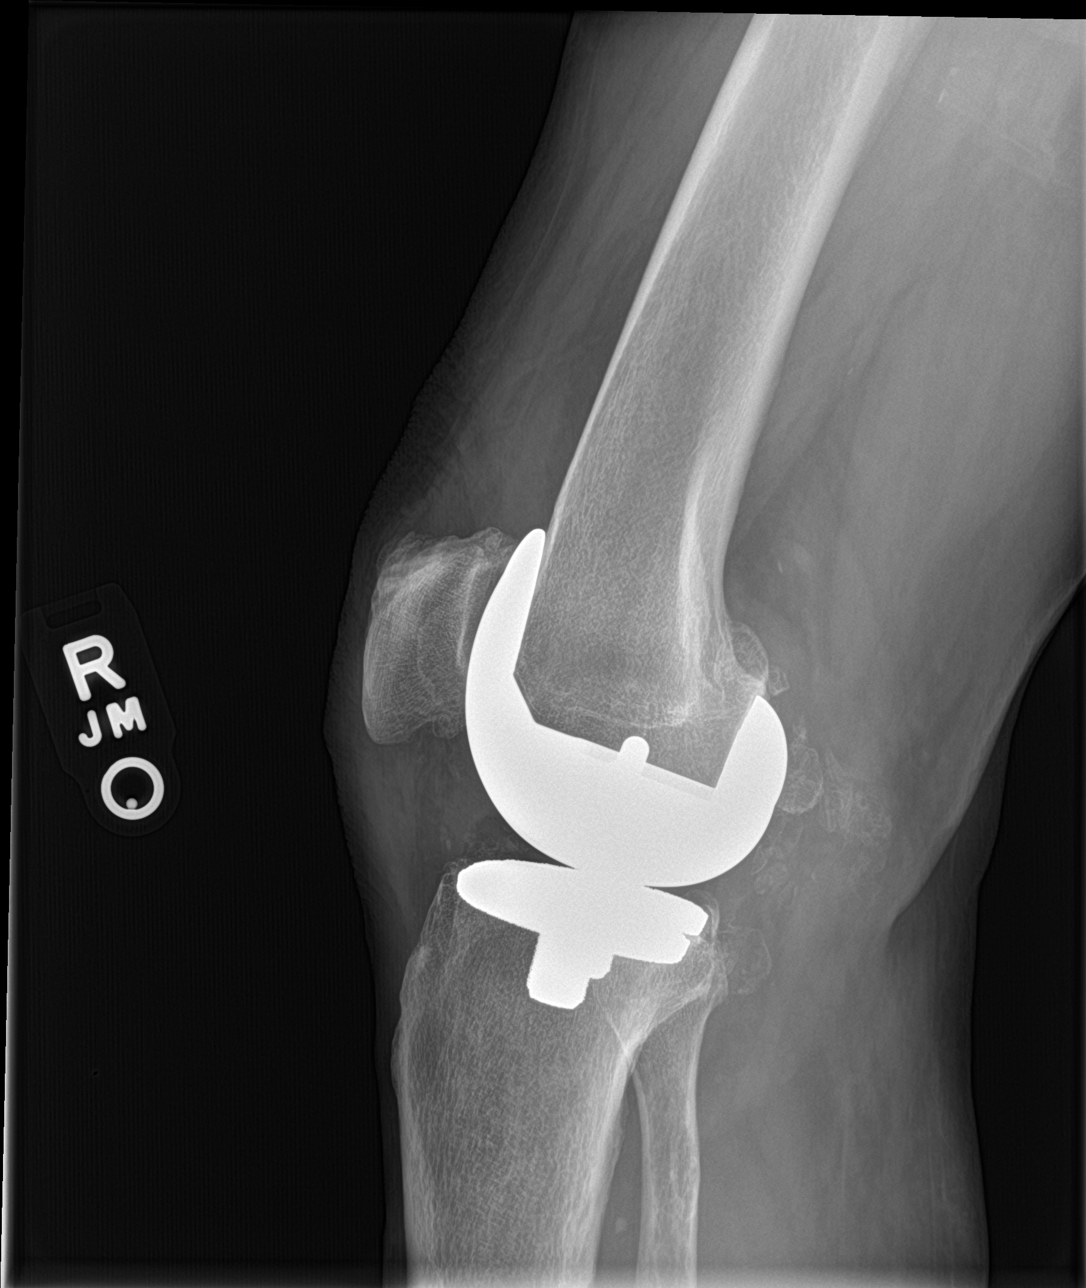

[knee sunrise]
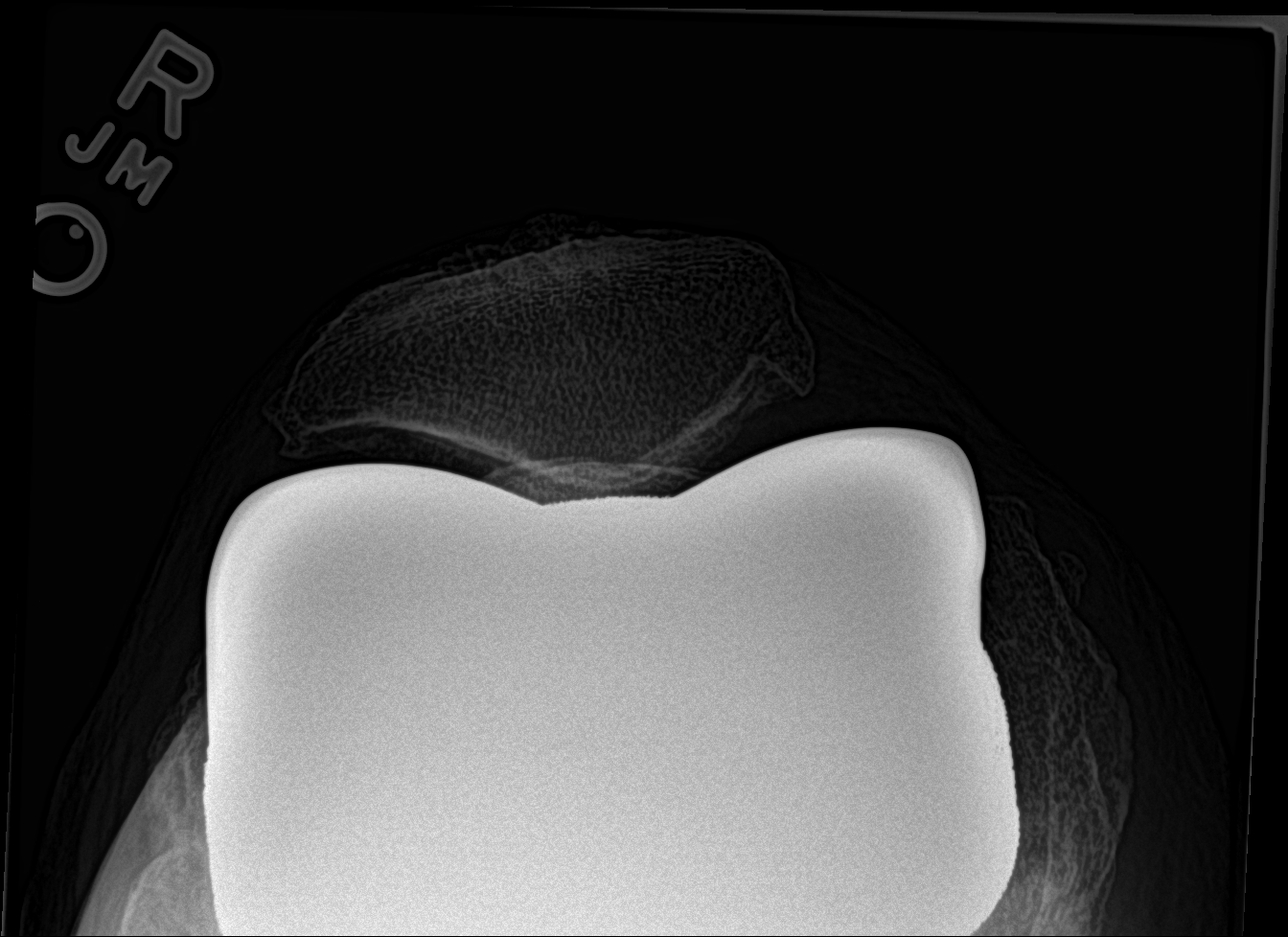

[4 of 4 positions shown; findings below may reference images not displayed]

FINDINGS: The femoral and tibial components of the right total knee
replacement appear to be in good position and alignment. No fracture
is seen. No complicating features are noted. No joint effusion is
noted.
IMPRESSION: Right total knee replacement components in good position. No
complicating features.

## 2017-09-09 DIAGNOSIS — L570 Actinic keratosis: Secondary | ICD-10-CM | POA: Diagnosis not present

## 2017-09-09 DIAGNOSIS — Z85828 Personal history of other malignant neoplasm of skin: Secondary | ICD-10-CM | POA: Diagnosis not present

## 2017-09-09 DIAGNOSIS — L400 Psoriasis vulgaris: Secondary | ICD-10-CM | POA: Diagnosis not present

## 2017-09-09 DIAGNOSIS — L814 Other melanin hyperpigmentation: Secondary | ICD-10-CM | POA: Diagnosis not present

## 2017-09-09 DIAGNOSIS — L821 Other seborrheic keratosis: Secondary | ICD-10-CM | POA: Diagnosis not present

## 2017-09-09 DIAGNOSIS — L579 Skin changes due to chronic exposure to nonionizing radiation, unspecified: Secondary | ICD-10-CM | POA: Diagnosis not present

## 2017-09-21 ENCOUNTER — Other Ambulatory Visit: Payer: Self-pay | Admitting: Sports Medicine

## 2017-09-21 MED ORDER — SIMVASTATIN 40 MG PO TABS: 40.0000 mg | ORAL_TABLET | Freq: Every evening | ORAL | 3 refills | Status: DC

## 2017-10-12 DIAGNOSIS — L814 Other melanin hyperpigmentation: Secondary | ICD-10-CM | POA: Diagnosis not present

## 2017-10-12 DIAGNOSIS — D0472 Carcinoma in situ of skin of left lower limb, including hip: Secondary | ICD-10-CM | POA: Diagnosis not present

## 2017-10-12 DIAGNOSIS — L400 Psoriasis vulgaris: Secondary | ICD-10-CM | POA: Diagnosis not present

## 2017-10-12 DIAGNOSIS — L668 Other cicatricial alopecia: Secondary | ICD-10-CM | POA: Diagnosis not present

## 2017-10-12 DIAGNOSIS — Z85828 Personal history of other malignant neoplasm of skin: Secondary | ICD-10-CM | POA: Diagnosis not present

## 2017-10-12 DIAGNOSIS — L821 Other seborrheic keratosis: Secondary | ICD-10-CM | POA: Diagnosis not present

## 2017-10-12 DIAGNOSIS — L579 Skin changes due to chronic exposure to nonionizing radiation, unspecified: Secondary | ICD-10-CM | POA: Diagnosis not present

## 2017-10-12 DIAGNOSIS — D485 Neoplasm of uncertain behavior of skin: Secondary | ICD-10-CM | POA: Diagnosis not present

## 2017-11-02 ENCOUNTER — Ambulatory Visit (INDEPENDENT_AMBULATORY_CARE_PROVIDER_SITE_OTHER): Payer: Medicare Other | Admitting: Sports Medicine

## 2017-11-02 ENCOUNTER — Encounter: Payer: Self-pay | Admitting: Sports Medicine

## 2017-11-02 VITALS — BP 135/82 | HR 76 | Ht 70.0 in | Wt 171.0 lb

## 2017-11-02 DIAGNOSIS — E538 Deficiency of other specified B group vitamins: Secondary | ICD-10-CM

## 2017-11-02 DIAGNOSIS — D500 Iron deficiency anemia secondary to blood loss (chronic): Secondary | ICD-10-CM

## 2017-11-02 DIAGNOSIS — E291 Testicular hypofunction: Secondary | ICD-10-CM

## 2017-11-02 DIAGNOSIS — E039 Hypothyroidism, unspecified: Secondary | ICD-10-CM

## 2017-11-02 LAB — POCT HEMOGLOBIN: Hemoglobin: 9.3 g/dL — AB (ref 14.1–18.1)

## 2017-11-02 NOTE — Assessment & Plan Note (Addendum)
Recent worsening of fatigue. Only mild aortic stenosis on recent echo from 2 years ago. Hemoglobin today 9.3 down from 12. Recent colonoscopy did not show any source of her bleed. Running a series of anemia panels to determine cause. Also checking some additional routine labs including pathology smear review. I will also send him home with stool Hemoccult cards. He does have a trip coming up, I will clear him depending on the results of the lab work.

## 2017-11-02 NOTE — Progress Notes (Signed)
Subjective:    CC: Worsening fatigue  HPI: This is a pleasant 84 year old male, he has a history of severe blood loss anemia secondary to a: Carcinoma, hemicolectomy and surgical cure.  More recently his hemoglobin was checked and was in the mid 12.  He felt pretty good.  Unfortunately more recently has had a worsening of fatigue.  No melena, hematochezia, hematemesis.  He did have a low testosterone level sometime ago.  Desires to get this rechecked.  He also had a colonoscopy not too long ago that did not reveal a source of bleed.  I reviewed the past medical history, family history, social history, surgical history, and allergies today and no changes were needed.  Please see the problem list section below in epic for further details.  Past Medical History: Past Medical History:  Diagnosis Date  . Aortic stenosis    mild AS 09/2015 echo  . Aortic valve disease    mild  . Asthma    as child  . Basal ganglia hemorrhage (HCC)    left basal ganlia intraparenchymal hemoorhage 06/17/16 (in the setting of HTN emergency)  . Colon cancer ([place]) 10/10/2015  . GERD (gastroesophageal reflux disease)   . Hyperlipidemia   . Hypertension   . Hypothyroidism   . Iron deficiency anemia due to chronic blood loss 10/02/2015  . Lower GI bleed 10/02/2015  . Skin cancer   . Thyroid disease   . TIA (transient ischemic attack)    Past Surgical History: Past Surgical History:  Procedure Laterality Date  . COLON RESECTION     ca  . EYE SURGERY    . JOINT REPLACEMENT     bil  . REPLACEMENT TOTAL KNEE    . TOTAL HIP ARTHROPLASTY Right 10/03/2016  . TOTAL HIP ARTHROPLASTY Right 10/03/2016   Procedure: TOTAL HIP ARTHROPLASTY ANTERIOR APPROACH;  Surgeon: Dorna Leitz, MD;  Location: [place];  Service: Orthopedics;  Laterality: Right;   Social History: Social History   Socioeconomic History  . Marital status: Married    Spouse name: Not on file  . Number of children: Not on file  . Years of education:  Not on file  . Highest education level: Not on file  Occupational History  . Not on file  Social Needs  . Financial resource strain: Not on file  . Food insecurity:    Worry: Not on file    Inability: Not on file  . Transportation needs:    Medical: Not on file    Non-medical: Not on file  Tobacco Use  . Smoking status: Never Smoker  . Smokeless tobacco: Never Used  Substance and Sexual Activity  . Alcohol use: No  . Drug use: No  . Sexual activity: Yes  Lifestyle  . Physical activity:    Days per week: Not on file    Minutes per session: Not on file  . Stress: Not on file  Relationships  . Social connections:    Talks on phone: Not on file    Gets together: Not on file    Attends religious service: Not on file    Active member of club or organization: Not on file    Attends meetings of clubs or organizations: Not on file    Relationship status: Not on file  Other Topics Concern  . Not on file  Social History Narrative  . Not on file   Family History: No family history on file. Allergies: Allergies  Allergen Reactions  . Tamiflu [Oseltamivir] Shortness  Of Breath    Actual allergic reaction to Tamiflu   Medications: See med rec.  Review of Systems: No fevers, chills, night sweats, weight loss, chest pain, or shortness of breath.   Objective:    General: Well Developed, well nourished, and in no acute distress.  Neuro: Alert and oriented x3, extra-ocular muscles intact, sensation grossly intact.  HEENT: Normocephalic, atraumatic, pupils equal round reactive to light, neck supple, no masses, no lymphadenopathy, thyroid nonpalpable.  Skin: Warm and dry, no rashes. Cardiac: Regular rate and rhythm, no murmurs rubs or gallops, no lower extremity edema.  Respiratory: Clear to auscultation bilaterally. Not using accessory muscles, speaking in full sentences.  Point-of-care hemoglobin down to 9.3.  Impression and Recommendations:    Iron deficiency anemia due to  chronic blood loss Recent worsening of fatigue. Only mild aortic stenosis on recent echo from 2 years ago. Hemoglobin today 9.3 down from 12. Recent colonoscopy did not show any source of her bleed. Running a series of anemia panels to determine cause. Also checking some additional routine labs including pathology smear review. I will also send him home with stool Hemoccult cards. He does have a trip coming up, I will clear him depending on the results of the lab work.  I spent 25 minutes with this patient, greater than 50% was face-to-face time counseling regarding the above diagnoses ___________________________________________ Gwen Her. Dianah Field, M.D., ABFM., CAQSM. Primary Care and [place] Instructor of [place] of [facility] of Medicine

## 2017-11-04 DIAGNOSIS — H401132 Primary open-angle glaucoma, bilateral, moderate stage: Secondary | ICD-10-CM | POA: Diagnosis not present

## 2017-11-05 ENCOUNTER — Other Ambulatory Visit: Payer: Self-pay | Admitting: Sports Medicine

## 2017-11-05 DIAGNOSIS — N401 Enlarged prostate with lower urinary tract symptoms: Secondary | ICD-10-CM

## 2017-11-06 LAB — ANEMIA PROFILE B
%SAT: 35 % (ref 20–48)
ABS Retic: 29890 cells/uL (ref 25000–9000)
Basophils Absolute: 83 {cells}/uL (ref 0–200)
Basophils Relative: 1.3 %
Eosinophils Absolute: 282 {cells}/uL (ref 15–500)
Eosinophils Relative: 4.4 %
Ferritin: 141 ng/mL (ref 24–380)
Folate: 10.2 ng/mL
HCT: 38.8 % (ref 38.5–50.0)
Hemoglobin: 13.1 g/dL — ABNORMAL LOW (ref 13.2–17.1)
Iron: 74 ug/dL (ref 50–180)
Lymphs Abs: 1024 cells/uL (ref 850–3900)
MCH: 29.8 pg (ref 27.0–33.0)
MCHC: 33.8 g/dL (ref 32.0–36.0)
MCV: 88.2 fL (ref 80.0–100.0)
MPV: 9.7 fL (ref 7.5–12.5)
Monocytes Relative: 8.2 %
Neutro Abs: 4486 {cells}/uL (ref 1500–7800)
Neutrophils Relative %: 70.1 %
Platelets: 311 10*3/uL (ref 140–400)
RBC: 4.4 Million/uL (ref 4.20–5.80)
RDW: 13 % (ref 11.0–15.0)
Retic Ct Pct: 0.7 %
TIBC: 209 ug/dL — ABNORMAL LOW (ref 250–425)
Total Lymphocyte: 16 %
Vitamin B-12: 340 pg/mL (ref 200–1100)
WBC mixed population: 525 cells/uL (ref 200–950)
WBC: 6.4 Thousand/uL (ref 3.8–10.8)

## 2017-11-06 LAB — COMPREHENSIVE METABOLIC PANEL WITH GFR
AST: 14 U/L (ref 10–35)
Alkaline phosphatase (APISO): 67 U/L (ref 40–115)
Chloride: 106 mmol/L (ref 98–110)
Creat: 1.87 mg/dL — ABNORMAL HIGH (ref 0.70–1.11)
Globulin: 2.7 g/dL (ref 1.9–3.7)

## 2017-11-06 LAB — COMPREHENSIVE METABOLIC PANEL
AG Ratio: 1.5 (calc) (ref 1.0–2.5)
ALT: 9 U/L (ref 9–46)
Albumin: 4 g/dL (ref 3.6–5.1)
BUN/Creatinine Ratio: 12 (calc) (ref 6–22)
BUN: 23 mg/dL (ref 7–25)
CO2: 29 mmol/L (ref 20–32)
Calcium: 9.3 mg/dL (ref 8.6–10.3)
Glucose, Bld: 96 mg/dL (ref 65–99)
Potassium: 4.3 mmol/L (ref 3.5–5.3)
Sodium: 141 mmol/L (ref 135–146)
Total Bilirubin: 0.4 mg/dL (ref 0.2–1.2)
Total Protein: 6.7 g/dL (ref 6.1–8.1)

## 2017-11-06 LAB — PATHOLOGIST SMEAR REVIEW

## 2017-11-06 LAB — TSH: TSH: 2.91 m[IU]/L (ref 0.40–4.50)

## 2017-11-06 LAB — TESTOSTERONE, FREE & TOTAL
Free Testosterone: 41.9 pg/mL (ref 30.0–135.0)
Testosterone, Total, LC-MS-MS: 330 ng/dL (ref 250–1100)

## 2017-11-09 ENCOUNTER — Other Ambulatory Visit: Payer: Self-pay

## 2017-11-09 DIAGNOSIS — Z1211 Encounter for screening for malignant neoplasm of colon: Secondary | ICD-10-CM

## 2017-11-09 LAB — POC HEMOCCULT BLD/STL (HOME/3-CARD/SCREEN)
Card #2 Fecal Occult Blod, POC: NEGATIVE
Card #3 Fecal Occult Blood, POC: POSITIVE
Fecal Occult Blood, POC: NEGATIVE

## 2017-11-09 NOTE — Progress Notes (Unsigned)
.  hem

## 2017-11-10 ENCOUNTER — Encounter: Payer: Self-pay | Admitting: Sports Medicine

## 2017-11-12 MED ORDER — VITAMIN B-12 1000 MCG PO TABS: 1000.0000 ug | ORAL_TABLET | Freq: Every day | ORAL | 11 refills | Status: AC

## 2017-11-16 ENCOUNTER — Telehealth: Payer: Self-pay | Admitting: Sports Medicine

## 2017-11-16 ENCOUNTER — Ambulatory Visit (INDEPENDENT_AMBULATORY_CARE_PROVIDER_SITE_OTHER): Payer: Medicare Other | Admitting: Sports Medicine

## 2017-11-16 ENCOUNTER — Encounter: Payer: Self-pay | Admitting: Sports Medicine

## 2017-11-16 VITALS — BP 153/90 | HR 66 | Ht 70.0 in | Wt 176.0 lb

## 2017-11-16 DIAGNOSIS — N401 Enlarged prostate with lower urinary tract symptoms: Secondary | ICD-10-CM | POA: Diagnosis not present

## 2017-11-16 DIAGNOSIS — D500 Iron deficiency anemia secondary to blood loss (chronic): Secondary | ICD-10-CM | POA: Diagnosis not present

## 2017-11-16 DIAGNOSIS — E291 Testicular hypofunction: Secondary | ICD-10-CM | POA: Diagnosis not present

## 2017-11-16 DIAGNOSIS — D0472 Carcinoma in situ of skin of left lower limb, including hip: Secondary | ICD-10-CM | POA: Diagnosis not present

## 2017-11-16 LAB — POCT HEMOGLOBIN: Hemoglobin: 11.5 g/dL — AB (ref 14.1–18.1)

## 2017-11-16 MED ORDER — TESTOSTERONE CYPIONATE 200 MG/ML IM SOLN
200.0000 mg | Freq: Once | INTRAMUSCULAR | Status: AC
  Administered 2017-11-16: 200 mg via INTRAMUSCULAR

## 2017-11-16 MED ORDER — TESTOSTERONE CYPIONATE 200 MG/ML IM SOLN: 200.0000 mg | INTRAMUSCULAR | 0 refills | Status: DC

## 2017-11-16 NOTE — Assessment & Plan Note (Signed)
Currently doing iron supplementation, he will add B12 supplementation over-the-counter. Rechecking fingerstick hemoglobin today. Testosterone supplementation will probably increase his hemoglobin which will be helpful in his case.

## 2017-11-16 NOTE — Patient Instructions (Signed)
Check labs 1 week after third injection.

## 2017-11-16 NOTE — Assessment & Plan Note (Signed)
Testosterone levels were in the very low level of normal, he does want to try testosterone supplementation. Testosterone 200 mg will be given intramuscular today and then he will return in 2 weeks for a nurse visit to learn to do the testosterone injections himself. Prescription written. We will check testosterone, PSA, CBC 1 week after his third injection.

## 2017-11-16 NOTE — Telephone Encounter (Signed)
Approvedtoday 11/16/2017-[facility].  CaseId:[id number];Status:Approved;Review Type:Prior Auth;Coverage Start Date:10/17/2017;Coverage End Date:11/16/2018;

## 2017-11-16 NOTE — Addendum Note (Signed)
Addended by: Darene Lamer on: 11/16/2017 11:13 AM   Modules accepted: Orders

## 2017-11-16 NOTE — Progress Notes (Signed)
Subjective:    CC: Multiple issues  HPI: Fatigue: Likely multifactorial but improved considerably with the tincture of time.  He did not have much anemia on his recheck for his hemoglobin in the lab, vitamin B12 was at the low level of normal and testosterone was at the lower limit of normal, he desired to supplement both.  I reviewed the past medical history, family history, social history, surgical history, and allergies today and no changes were needed.  Please see the problem list section below in epic for further details.  Past Medical History: Past Medical History:  Diagnosis Date  . Aortic stenosis    mild AS 09/2015 echo  . Aortic valve disease    mild  . Asthma    as child  . Basal ganglia hemorrhage (HCC)    left basal ganlia intraparenchymal hemoorhage 06/17/16 (in the setting of HTN emergency)  . Colon cancer ([place]) 10/10/2015  . GERD (gastroesophageal reflux disease)   . Hyperlipidemia   . Hypertension   . Hypothyroidism   . Iron deficiency anemia due to chronic blood loss 10/02/2015  . Lower GI bleed 10/02/2015  . Skin cancer   . Thyroid disease   . TIA (transient ischemic attack)    Past Surgical History: Past Surgical History:  Procedure Laterality Date  . COLON RESECTION     ca  . EYE SURGERY    . JOINT REPLACEMENT     bil  . REPLACEMENT TOTAL KNEE    . TOTAL HIP ARTHROPLASTY Right 10/03/2016  . TOTAL HIP ARTHROPLASTY Right 10/03/2016   Procedure: TOTAL HIP ARTHROPLASTY ANTERIOR APPROACH;  Surgeon: Dorna Leitz, MD;  Location: [place];  Service: Orthopedics;  Laterality: Right;   Social History: Social History   Socioeconomic History  . Marital status: Married    Spouse name: Not on file  . Number of children: Not on file  . Years of education: Not on file  . Highest education level: Not on file  Occupational History  . Not on file  Social Needs  . Financial resource strain: Not on file  . Food insecurity:    Worry: Not on file    Inability: Not on  file  . Transportation needs:    Medical: Not on file    Non-medical: Not on file  Tobacco Use  . Smoking status: Never Smoker  . Smokeless tobacco: Never Used  Substance and Sexual Activity  . Alcohol use: No  . Drug use: No  . Sexual activity: Yes  Lifestyle  . Physical activity:    Days per week: Not on file    Minutes per session: Not on file  . Stress: Not on file  Relationships  . Social connections:    Talks on phone: Not on file    Gets together: Not on file    Attends religious service: Not on file    Active member of club or organization: Not on file    Attends meetings of clubs or organizations: Not on file    Relationship status: Not on file  Other Topics Concern  . Not on file  Social History Narrative  . Not on file   Family History: No family history on file. Allergies: Allergies  Allergen Reactions  . Tamiflu [Oseltamivir] Shortness Of Breath    Actual allergic reaction to Tamiflu   Medications: See med rec.  Review of Systems: No fevers, chills, night sweats, weight loss, chest pain, or shortness of breath.   Objective:    General:  Well Developed, well nourished, and in no acute distress.  Neuro: Alert and oriented x3, extra-ocular muscles intact, sensation grossly intact.  HEENT: Normocephalic, atraumatic, pupils equal round reactive to light, neck supple, no masses, no lymphadenopathy, thyroid nonpalpable.  Skin: Warm and dry, no rashes. Cardiac: Regular rate and rhythm, no murmurs rubs or gallops, no lower extremity edema.  Respiratory: Clear to auscultation bilaterally. Not using accessory muscles, speaking in full sentences.  Impression and Recommendations:    Male hypogonadism Testosterone levels were in the very low level of normal, he does want to try testosterone supplementation. Testosterone 200 mg will be given intramuscular today and then he will return in 2 weeks for a nurse visit to learn to do the testosterone injections  himself. Prescription written. We will check testosterone, PSA, CBC 1 week after his third injection.  Iron deficiency anemia due to chronic blood loss Currently doing iron supplementation, he will add B12 supplementation over-the-counter. Rechecking fingerstick hemoglobin today. Testosterone supplementation will probably increase his hemoglobin which will be helpful in his case.  I spent 25 minutes with this patient, greater than 50% was face-to-face time counseling regarding the above diagnoses, this was also spent discussing the risks and benefits of testosterone supplementation. ___________________________________________ Gwen Her. Dianah Field, M.D., ABFM., CAQSM. Primary Care and [place] Instructor of [place] of [facility] of Medicine

## 2017-11-17 ENCOUNTER — Encounter: Payer: Self-pay | Admitting: Sports Medicine

## 2017-11-17 MED ORDER — NYSTATIN 100000 UNIT/ML MT SUSP: 500000.0000 [IU] | Freq: Four times a day (QID) | OROMUCOSAL | 0 refills | Status: DC

## 2017-11-19 LAB — PSA, TOTAL AND FREE
PSA, % Free: 38 % (ref >25)
PSA, Free: 0.3 ng/mL
PSA, Total: 0.8 ng/mL (ref <=4.0)

## 2017-11-19 LAB — CBC
HCT: 36.8 % — ABNORMAL LOW (ref 38.5–50.0)
Hemoglobin: 12.4 g/dL — ABNORMAL LOW (ref 13.2–17.1)
MCH: 29 pg (ref 27.0–33.0)
MCHC: 33.7 g/dL (ref 32.0–36.0)
MCV: 86.2 fL (ref 80.0–100.0)
MPV: 9.8 fL (ref 7.5–12.5)
Platelets: 282 10*3/uL (ref 140–400)
RBC: 4.27 10*6/uL (ref 4.20–5.80)
RDW: 13.6 % (ref 11.0–15.0)
WBC: 7 10*3/uL (ref 3.8–10.8)

## 2017-11-19 LAB — TESTOSTERONE, FREE & TOTAL
Free Testosterone: 44.6 pg/mL (ref 30.0–135.0)
Testosterone, Total, LC-MS-MS: 333 ng/dL (ref 250–1100)

## 2017-11-27 ENCOUNTER — Ambulatory Visit: Payer: 59

## 2017-11-27 ENCOUNTER — Ambulatory Visit (INDEPENDENT_AMBULATORY_CARE_PROVIDER_SITE_OTHER): Payer: Medicare Other | Admitting: Sports Medicine

## 2017-11-27 VITALS — BP 138/87 | HR 67

## 2017-11-27 DIAGNOSIS — E291 Testicular hypofunction: Secondary | ICD-10-CM | POA: Diagnosis not present

## 2017-11-27 MED ORDER — TESTOSTERONE CYPIONATE 200 MG/ML IM SOLN
200.0000 mg | Freq: Once | INTRAMUSCULAR | Status: AC
  Administered 2017-11-27: 200 mg via INTRAMUSCULAR

## 2017-11-27 NOTE — Progress Notes (Signed)
Dyshawn presents to the clinic for a testosterone injection.  This is his first testosterone injection. Pt tolerated injection in the LUOQ well without complications.  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 Pt advise to make next appointment in 14 days. -[facility]/RMA

## 2017-11-30 DIAGNOSIS — J4520 Mild intermittent asthma, uncomplicated: Secondary | ICD-10-CM | POA: Diagnosis not present

## 2017-11-30 DIAGNOSIS — R911 Solitary pulmonary nodule: Secondary | ICD-10-CM | POA: Diagnosis not present

## 2017-11-30 DIAGNOSIS — C182 Malignant neoplasm of ascending colon: Secondary | ICD-10-CM | POA: Diagnosis not present

## 2017-12-24 DIAGNOSIS — R918 Other nonspecific abnormal finding of lung field: Secondary | ICD-10-CM | POA: Diagnosis not present

## 2017-12-24 DIAGNOSIS — R911 Solitary pulmonary nodule: Secondary | ICD-10-CM | POA: Diagnosis not present

## 2017-12-25 ENCOUNTER — Ambulatory Visit: Payer: 59 | Admitting: Sports Medicine

## 2018-01-14 ENCOUNTER — Encounter: Payer: Self-pay | Admitting: Sports Medicine

## 2018-01-14 DIAGNOSIS — E291 Testicular hypofunction: Secondary | ICD-10-CM

## 2018-01-14 MED ORDER — TESTOSTERONE CYPIONATE 200 MG/ML IM SOLN: 200.0000 mg | INTRAMUSCULAR | 0 refills | Status: DC

## 2018-01-20 DIAGNOSIS — L579 Skin changes due to chronic exposure to nonionizing radiation, unspecified: Secondary | ICD-10-CM | POA: Diagnosis not present

## 2018-01-20 DIAGNOSIS — A499 Bacterial infection, unspecified: Secondary | ICD-10-CM | POA: Diagnosis not present

## 2018-01-20 DIAGNOSIS — L0100 Impetigo, unspecified: Secondary | ICD-10-CM | POA: Diagnosis not present

## 2018-01-20 DIAGNOSIS — D0472 Carcinoma in situ of skin of left lower limb, including hip: Secondary | ICD-10-CM | POA: Diagnosis not present

## 2018-01-20 DIAGNOSIS — Z85828 Personal history of other malignant neoplasm of skin: Secondary | ICD-10-CM | POA: Diagnosis not present

## 2018-01-20 DIAGNOSIS — L233 Allergic contact dermatitis due to drugs in contact with skin: Secondary | ICD-10-CM | POA: Diagnosis not present

## 2018-01-24 DIAGNOSIS — Z23 Encounter for immunization: Secondary | ICD-10-CM | POA: Diagnosis not present

## 2018-03-10 DIAGNOSIS — C182 Malignant neoplasm of ascending colon: Secondary | ICD-10-CM | POA: Diagnosis not present

## 2018-03-10 DIAGNOSIS — J479 Bronchiectasis, uncomplicated: Secondary | ICD-10-CM | POA: Diagnosis not present

## 2018-03-10 DIAGNOSIS — J4520 Mild intermittent asthma, uncomplicated: Secondary | ICD-10-CM | POA: Diagnosis not present

## 2018-03-10 DIAGNOSIS — R911 Solitary pulmonary nodule: Secondary | ICD-10-CM | POA: Diagnosis not present

## 2018-03-15 ENCOUNTER — Other Ambulatory Visit: Payer: Self-pay | Admitting: Sports Medicine

## 2018-03-16 DIAGNOSIS — L579 Skin changes due to chronic exposure to nonionizing radiation, unspecified: Secondary | ICD-10-CM | POA: Diagnosis not present

## 2018-03-16 DIAGNOSIS — D1801 Hemangioma of skin and subcutaneous tissue: Secondary | ICD-10-CM | POA: Diagnosis not present

## 2018-03-16 DIAGNOSIS — L400 Psoriasis vulgaris: Secondary | ICD-10-CM | POA: Diagnosis not present

## 2018-03-16 DIAGNOSIS — D225 Melanocytic nevi of trunk: Secondary | ICD-10-CM | POA: Diagnosis not present

## 2018-03-16 DIAGNOSIS — L570 Actinic keratosis: Secondary | ICD-10-CM | POA: Diagnosis not present

## 2018-03-16 DIAGNOSIS — D045 Carcinoma in situ of skin of trunk: Secondary | ICD-10-CM | POA: Diagnosis not present

## 2018-03-16 DIAGNOSIS — D485 Neoplasm of uncertain behavior of skin: Secondary | ICD-10-CM | POA: Diagnosis not present

## 2018-03-16 DIAGNOSIS — L814 Other melanin hyperpigmentation: Secondary | ICD-10-CM | POA: Diagnosis not present

## 2018-03-16 DIAGNOSIS — L821 Other seborrheic keratosis: Secondary | ICD-10-CM | POA: Diagnosis not present

## 2018-03-16 DIAGNOSIS — D0461 Carcinoma in situ of skin of right upper limb, including shoulder: Secondary | ICD-10-CM | POA: Diagnosis not present

## 2018-03-26 IMAGING — CT CT ANGIO CHEST
2 of 8 series · 18 of 36 positions shown · IV contrast (isovue)
Comparison: Chest radiograph 06/25/2015

CLINICAL DATA: Shortness of breath for 2 weeks.

EXAM:
CT ANGIOGRAPHY CHEST WITH CONTRAST
TECHNIQUE: Multidetector CT imaging of the chest was performed using the
standard protocol during bolus administration of intravenous
contrast. Multiplanar CT image reconstructions and MIPs were
obtained to evaluate the vascular anatomy.
CONTRAST:  80 cc Isovue 370 intravenously.

[Series 6: pe thins · axial · 0.75mm/px · z∈[-326,-17]mm · 17 of 347 slices shown]
[im 19/347  lung]
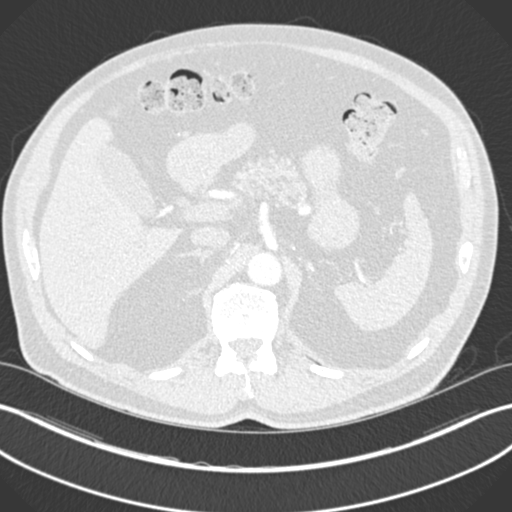
[im 37/347  mediastinal]
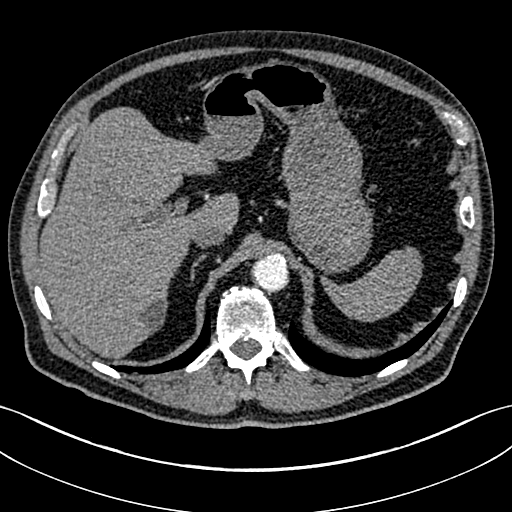
[im 55/347  lung]
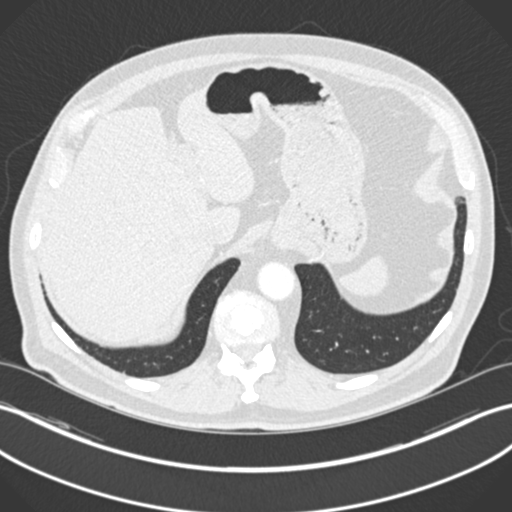
[im 73/347  mediastinal]
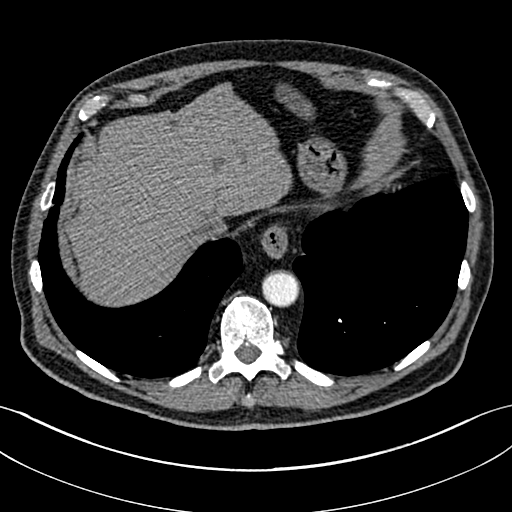
[im 92/347  lung]
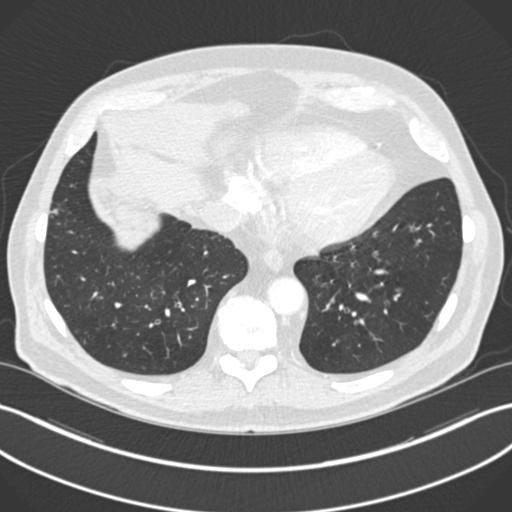
[im 110/347  mediastinal]
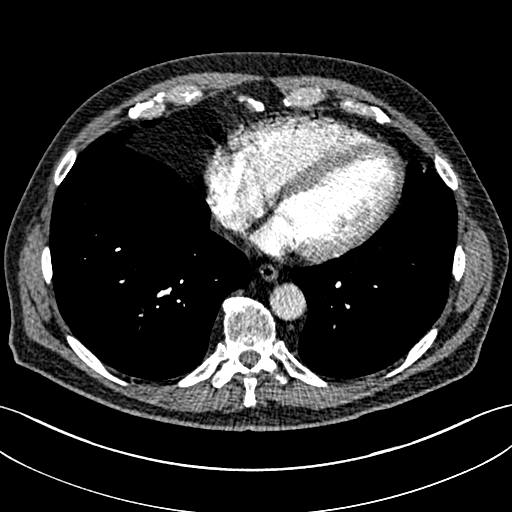
[im 128/347  lung]
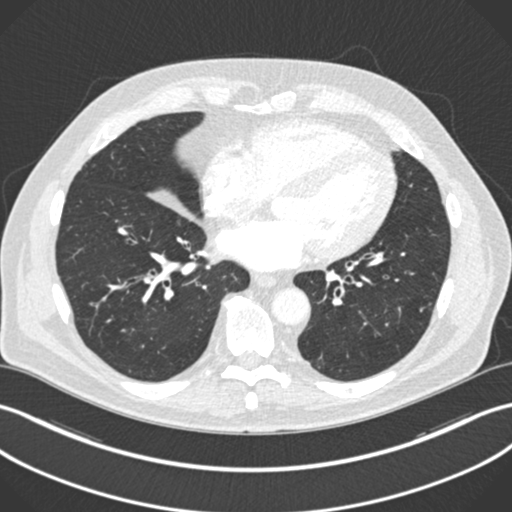
[im 146/347  mediastinal]
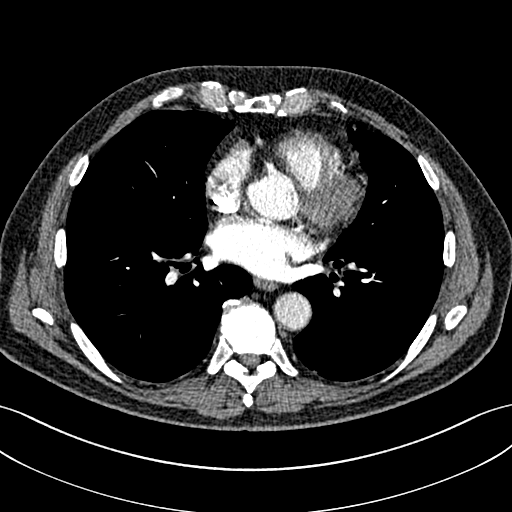
[im 183/347  lung]
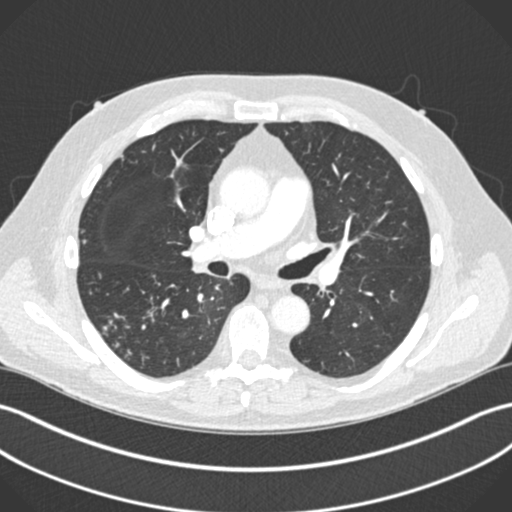
[im 201/347  mediastinal]
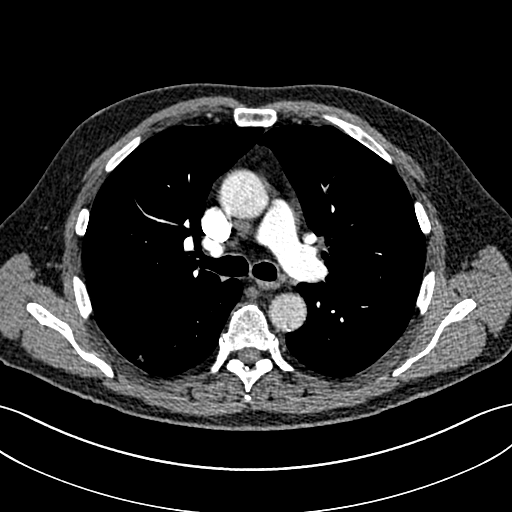
[im 219/347  lung]
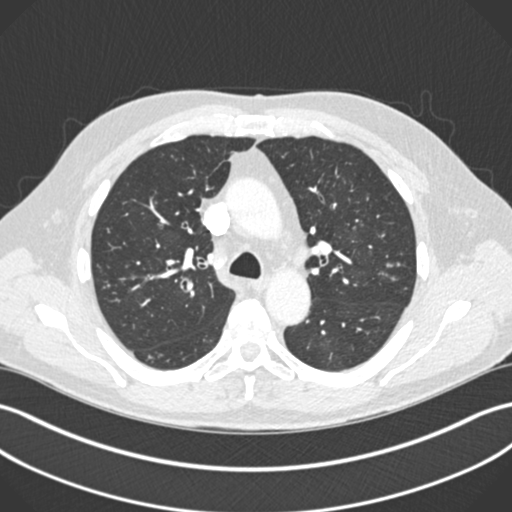
[im 237/347  mediastinal]
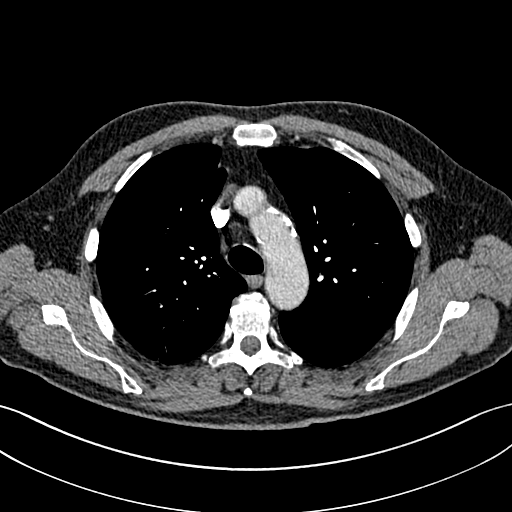
[im 255/347  lung]
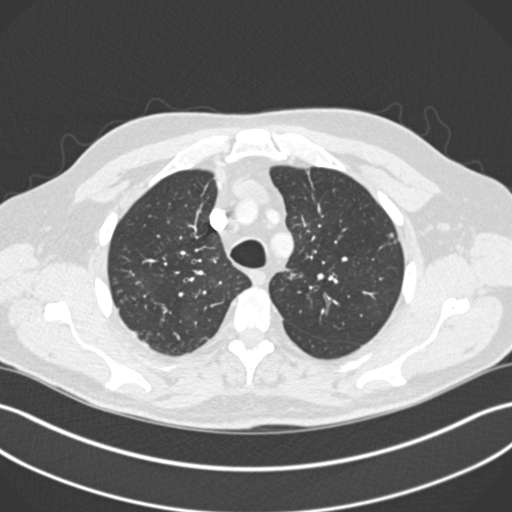
[im 274/347  mediastinal]
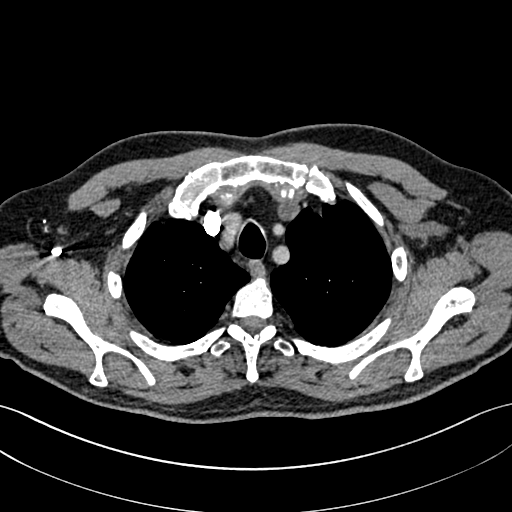
[im 292/347  lung]
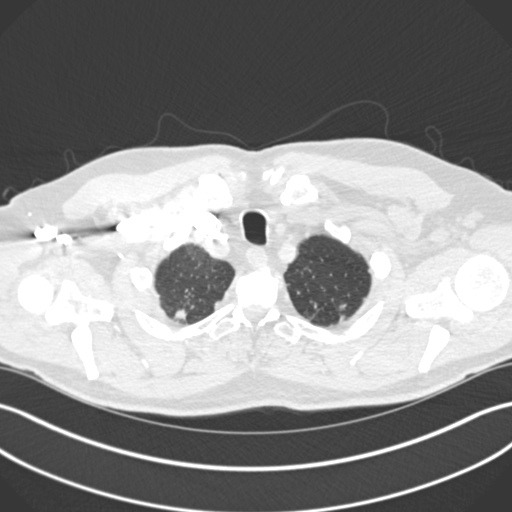
[im 310/347  mediastinal]
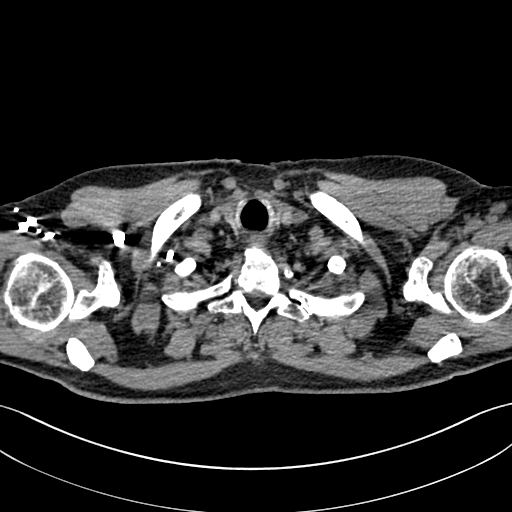
[im 328/347  lung]
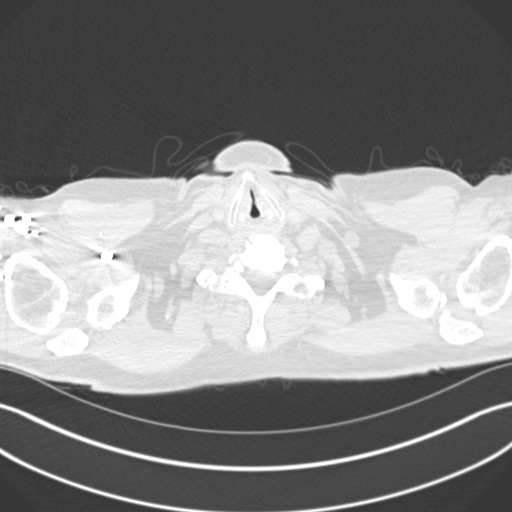

[Series 7: pe coronal mpr · coronal · 0.71mm/px · 1 of 159 slices shown]
[im 80/159  mediastinal]
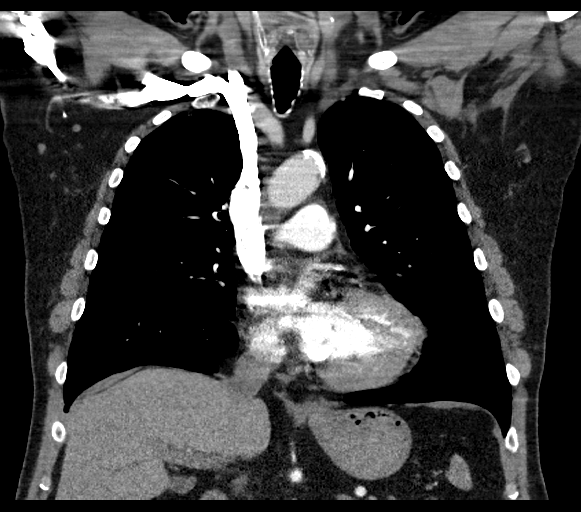

[18 of 36 positions shown; findings below may reference images not displayed]

FINDINGS: Mediastinum/Lymph Nodes: No pulmonary emboli or thoracic aortic
dissection identified. No masses or pathologically enlarged lymph
nodes identified. The heart is normal in size. There is no
pericardial effusion.

Lungs/Pleura: There is biapical subpleural thickening. There are
patchy areas of tree-in-bud airspace opacities, with predominantly
peripheral subpleural distribution in both lungs with nonspecific
appearance. More well-defined sub pleural nodules are seen in the
left upper lobe, measuring 8.3 mm (image 22/116 sequence 5), and
superior segment of right lower lobe measuring 6.8 mm (image 40/116,
sequence 5).

Upper abdomen: There is a 2.6 cm hypoattenuated right hepatic
lesion, which measures water density. Splenic granulomata are noted.

Musculoskeletal: No chest wall mass or suspicious bone lesions
identified.

Review of the MIP images confirms the above findings.
IMPRESSION: Bilateral, more pronounced on the right, diffuse tree-in-bud
subpleural airspace opacities. This is a nonspecific finding usually
associated with acute pulmonary infection, including atypical
infections such as fungal or MIKAILIONIENE.

Two more well-defined subpleural nodule is in the left upper lobe
and right lower lobe, measuring 8 and 6 mm respectively, which may
also represent inflammatory nodules. Non-contrast chest CT at 3-6
months is recommended. If the nodules are stable at time of repeat
CT, then future CT at 18-24 months (from today's scan) is considered
optional for low-risk patients, but is recommended for high-risk
patients. This recommendation follows the consensus statement:
Guidelines for Management of Incidental Pulmonary Nodules Detected
on CT Images:From the [HOSPITAL] 0932; published online
before print (10.1148/[id number]).

No evidence of pulmonary embolus.

2.6 cm hypoattenuated right hepatic lesion, which measures water
density and therefore may represent a hepatic cyst.

## 2018-04-01 ENCOUNTER — Encounter: Payer: Self-pay | Admitting: Sports Medicine

## 2018-04-01 DIAGNOSIS — E291 Testicular hypofunction: Secondary | ICD-10-CM

## 2018-04-05 MED ORDER — TESTOSTERONE CYPIONATE 200 MG/ML IM SOLN: 200.0000 mg | INTRAMUSCULAR | 0 refills | Status: DC

## 2018-04-19 ENCOUNTER — Encounter: Payer: Self-pay | Admitting: Sports Medicine

## 2018-04-19 ENCOUNTER — Ambulatory Visit (INDEPENDENT_AMBULATORY_CARE_PROVIDER_SITE_OTHER): Payer: Medicare Other | Admitting: Sports Medicine

## 2018-04-19 VITALS — BP 147/76 | HR 74 | Ht 70.0 in | Wt 180.0 lb

## 2018-04-19 DIAGNOSIS — R350 Frequency of micturition: Secondary | ICD-10-CM | POA: Diagnosis not present

## 2018-04-19 DIAGNOSIS — N529 Male erectile dysfunction, unspecified: Secondary | ICD-10-CM | POA: Diagnosis not present

## 2018-04-19 DIAGNOSIS — E291 Testicular hypofunction: Secondary | ICD-10-CM

## 2018-04-19 DIAGNOSIS — N401 Enlarged prostate with lower urinary tract symptoms: Secondary | ICD-10-CM

## 2018-04-19 MED ORDER — TADALAFIL 10 MG PO TABS: 10.0000 mg | ORAL_TABLET | Freq: Every day | ORAL | 11 refills | Status: DC | PRN

## 2018-04-19 NOTE — Patient Instructions (Signed)
Erectile Dysfunction  Erectile dysfunction (ED) is the inability to get or keep an erection in order to have sexual intercourse. Erectile dysfunction may include:   Inability to get an erection.   Lack of enough hardness of the erection to allow penetration.   Loss of the erection before sex is finished.  What are the causes?  This condition may be caused by:   Certain medicines, such as:  ? Pain relievers.  ? Antihistamines.  ? Antidepressants.  ? Blood pressure medicines.  ? Water pills (diuretics).  ? Ulcer medicines.  ? Muscle relaxants.  ? Drugs.   Excessive drinking.   Psychological causes, such as:  ? Anxiety.  ? Depression.  ? Sadness.  ? Exhaustion.  ? Performance fear.  ? Stress.   Physical causes, such as:  ? Artery problems. This may include diabetes, smoking, liver disease, or atherosclerosis.  ? High blood pressure.  ? Hormonal problems, such as low testosterone.  ? Obesity.  ? Nerve problems. This may include back or pelvic injuries, diabetes mellitus, multiple sclerosis, or Parkinson disease.  What are the signs or symptoms?  Symptoms of this condition include:   Inability to get an erection.   Lack of enough hardness of the erection to allow penetration.   Loss of the erection before sex is finished.   Normal erections at some times, but with frequent unsatisfactory episodes.   Low sexual satisfaction in either partner due to erection problems.   A curved penis occurring with erection. The curve may cause pain or the penis may be too curved to allow for intercourse.   Never having nighttime erections.  How is this diagnosed?  This condition is often diagnosed by:   Performing a physical exam to find other diseases or specific problems with the penis.   Asking you detailed questions about the problem.   Performing blood tests to check for diabetes mellitus or to measure hormone levels.   Performing other tests to check for underlying health conditions.   Performing an ultrasound  exam to check for scarring.   Performing a test to check blood flow to the penis.   Doing a sleep study at home to measure nighttime erections.  How is this treated?  This condition may be treated by:   Medicine taken by mouth to help you achieve an erection (oral medicine).   Hormone replacement therapy to replace low testosterone levels.   Medicine that is injected into the penis. Your health care provider may instruct you how to give yourself these injections at home.   Vacuum pump. This is a pump with a ring on it. The pump and ring are placed on the penis and used to create pressure that helps the penis become erect.   Penile implant surgery. In this procedure, you may receive:  ? An inflatable implant. This consists of cylinders, a pump, and a reservoir. The cylinders can be inflated with a fluid that helps to create an erection, and they can be deflated after intercourse.  ? A semi-rigid implant. This consists of two silicone rubber rods. The rods provide some rigidity. They are also flexible, so the penis can both curve downward in its normal position and become straight for sexual intercourse.   Blood vessel surgery, to improve blood flow to the penis. During this procedure, a blood vessel from a different part of the body is placed into the penis to allow blood to flow around (bypass) damaged or blocked blood vessels.     Lifestyle changes, such as exercising more, losing weight, and quitting smoking.  Follow these instructions at home:  Medicines     Take over-the-counter and prescription medicines only as told by your health care provider. Do not increase the dosage without first discussing it with your health care provider.   If you are using self-injections, perform injections as directed by your health care provider. Make sure to avoid any veins that are on the surface of the penis. After giving an injection, apply pressure to the injection site for 5 minutes.  General  instructions   Exercise regularly, as directed by your health care provider. Work with your health care provider to lose weight, if needed.   Do not use any products that contain nicotine or tobacco, such as cigarettes and e-cigarettes. If you need help quitting, ask your health care provider.   Before using a vacuum pump, read the instructions that come with the pump and discuss any questions with your health care provider.   Keep all follow-up visits as told by your health care provider. This is important.  Contact a health care provider if:   You feel nauseous.   You vomit.  Get help right away if:   You are taking oral or injectable medicines and you have an erection that lasts longer than 4 hours. If your health care provider is unavailable, go to the nearest emergency room for evaluation. An erection that lasts much longer than 4 hours can result in permanent damage to your penis.   You have severe pain in your groin or abdomen.   You develop redness or severe swelling of your penis.   You have redness spreading up into your groin or lower abdomen.   You are unable to urinate.   You experience chest pain or a rapid heart beat (palpitations) after taking oral medicines.  Summary   Erectile dysfunction (ED) is the inability to get or keep an erection during sexual intercourse. This problem can usually be treated successfully.   This condition is diagnosed based on a physical exam, your symptoms, and tests to determine the cause. Treatment varies depending on the cause, and may include medicines, hormone therapy, surgery, or vacuum pump.   You may need follow-up visits to make sure that you are using your medicines or devices correctly.   Get help right away if you are taking or injecting medicines and you have an erection that lasts longer than 4 hours.  This information is not intended to replace advice given to you by your health care provider. Make sure you discuss any questions you have with  your health care provider.  Document Released: 03/28/2000 Document Revised: 04/16/2016 Document Reviewed: 04/16/2016  Elsevier Interactive Patient Education  2019 Elsevier Inc.

## 2018-04-19 NOTE — Assessment & Plan Note (Signed)
Feeling much better on testosterone supplementation, his last shot was last Friday, he needs to wait till this Friday to get his levels checked.

## 2018-04-19 NOTE — Progress Notes (Signed)
Subjective:    CC: Follow-up  HPI: Hypergonadism: Doing well with testosterone injections but we have not yet rechecked his levels to ensure that his dose is adequate.  He feels much, much better.  Erectile dysfunction: Difficulty achieving erections, maintaining erections but does have good sex drive.  I reviewed the past medical history, family history, social history, surgical history, and allergies today and no changes were needed.  Please see the problem list section below in epic for further details.  Past Medical History: Past Medical History:  Diagnosis Date  . Aortic stenosis    mild AS 09/2015 echo  . Aortic valve disease    mild  . Asthma    as child  . Basal ganglia hemorrhage (HCC)    left basal ganlia intraparenchymal hemoorhage 06/17/16 (in the setting of HTN emergency)  . Colon cancer ([place]) 10/10/2015  . GERD (gastroesophageal reflux disease)   . Hyperlipidemia   . Hypertension   . Hypothyroidism   . Iron deficiency anemia due to chronic blood loss 10/02/2015  . Lower GI bleed 10/02/2015  . Skin cancer   . Thyroid disease   . TIA (transient ischemic attack)    Past Surgical History: Past Surgical History:  Procedure Laterality Date  . COLON RESECTION     ca  . EYE SURGERY    . JOINT REPLACEMENT     bil  . REPLACEMENT TOTAL KNEE    . TOTAL HIP ARTHROPLASTY Right 10/03/2016  . TOTAL HIP ARTHROPLASTY Right 10/03/2016   Procedure: TOTAL HIP ARTHROPLASTY ANTERIOR APPROACH;  Surgeon: Dorna Leitz, MD;  Location: [facility];  Service: Orthopedics;  Laterality: Right;   Social History: Social History   Socioeconomic History  . Marital status: Married    Spouse name: Not on file  . Number of children: Not on file  . Years of education: Not on file  . Highest education level: Not on file  Occupational History  . Not on file  Social Needs  . Financial resource strain: Not on file  . Food insecurity:    Worry: Not on file    Inability: Not on file  .  Transportation needs:    Medical: Not on file    Non-medical: Not on file  Tobacco Use  . Smoking status: Never Smoker  . Smokeless tobacco: Never Used  Substance and Sexual Activity  . Alcohol use: No  . Drug use: No  . Sexual activity: Yes  Lifestyle  . Physical activity:    Days per week: Not on file    Minutes per session: Not on file  . Stress: Not on file  Relationships  . Social connections:    Talks on phone: Not on file    Gets together: Not on file    Attends religious service: Not on file    Active member of club or organization: Not on file    Attends meetings of clubs or organizations: Not on file    Relationship status: Not on file  Other Topics Concern  . Not on file  Social History Narrative  . Not on file   Family History: No family history on file. Allergies: Allergies  Allergen Reactions  . Tamiflu [Oseltamivir] Shortness Of Breath    Actual allergic reaction to Tamiflu   Medications: See med rec.  Review of Systems: No fevers, chills, night sweats, weight loss, chest pain, or shortness of breath.   Objective:    General: Well Developed, well nourished, and in no acute distress.  Neuro: Alert and oriented x3, extra-ocular muscles intact, sensation grossly intact.  HEENT: Normocephalic, atraumatic, pupils equal round reactive to light, neck supple, no masses, no lymphadenopathy, thyroid nonpalpable.  Skin: Warm and dry, no rashes. Cardiac: Regular rate and rhythm, no murmurs rubs or gallops, no lower extremity edema.  Respiratory: Clear to auscultation bilaterally. Not using accessory muscles, speaking in full sentences.  Impression and Recommendations:    Erectile dysfunction Patient has a discount coupon with Cialis, principal issue is quality and duration of erections. Sex drive is there. Good Rx coupon is for Cialis 10 mg tablets, he will break these in half.  Male hypogonadism Feeling much better on testosterone supplementation, his last  shot was last Friday, he needs to wait till this Friday to get his levels checked.  ___________________________________________ Gwen Her. Dianah Field, M.D., ABFM., CAQSM. Primary Care and Sports Medicine [facility]  Adjunct Professor of [employer] [facility] [employer] [employer]

## 2018-04-19 NOTE — Assessment & Plan Note (Signed)
Patient has a discount coupon with Cialis, principal issue is quality and duration of erections. Sex drive is there. Good Rx coupon is for Cialis 10 mg tablets, he will break these in half.

## 2018-04-23 DIAGNOSIS — N401 Enlarged prostate with lower urinary tract symptoms: Secondary | ICD-10-CM | POA: Diagnosis not present

## 2018-04-23 DIAGNOSIS — R350 Frequency of micturition: Secondary | ICD-10-CM | POA: Diagnosis not present

## 2018-04-23 DIAGNOSIS — E291 Testicular hypofunction: Secondary | ICD-10-CM | POA: Diagnosis not present

## 2018-04-27 LAB — CBC
HCT: 40.6 % (ref 38.5–50.0)
Hemoglobin: 13.3 g/dL (ref 13.2–17.1)
MCH: 28.3 pg (ref 27.0–33.0)
MCHC: 32.8 g/dL (ref 32.0–36.0)
MCV: 86.4 fL (ref 80.0–100.0)
MPV: 10.4 fL (ref 7.5–12.5)
Platelets: 302 10*3/uL (ref 140–400)
RBC: 4.7 Million/uL (ref 4.20–5.80)
RDW: 13.5 % (ref 11.0–15.0)
WBC: 6.9 Thousand/uL (ref 3.8–10.8)

## 2018-04-27 LAB — PSA, TOTAL AND FREE
PSA, % Free: 30 % (ref >25)
PSA, Free: 1.7 ng/mL
PSA, Total: 5.6 ng/mL — ABNORMAL HIGH (ref <=4.0)

## 2018-04-27 LAB — TESTOSTERONE, FREE & TOTAL

## 2018-04-29 DIAGNOSIS — L570 Actinic keratosis: Secondary | ICD-10-CM | POA: Diagnosis not present

## 2018-04-29 DIAGNOSIS — D045 Carcinoma in situ of skin of trunk: Secondary | ICD-10-CM | POA: Diagnosis not present

## 2018-04-29 DIAGNOSIS — D0461 Carcinoma in situ of skin of right upper limb, including shoulder: Secondary | ICD-10-CM | POA: Diagnosis not present

## 2018-05-06 ENCOUNTER — Encounter: Payer: Self-pay | Admitting: Sports Medicine

## 2018-05-12 DIAGNOSIS — H524 Presbyopia: Secondary | ICD-10-CM | POA: Diagnosis not present

## 2018-05-12 DIAGNOSIS — H401132 Primary open-angle glaucoma, bilateral, moderate stage: Secondary | ICD-10-CM | POA: Diagnosis not present

## 2018-05-17 DIAGNOSIS — I10 Essential (primary) hypertension: Secondary | ICD-10-CM | POA: Diagnosis not present

## 2018-05-17 DIAGNOSIS — I350 Nonrheumatic aortic (valve) stenosis: Secondary | ICD-10-CM | POA: Diagnosis not present

## 2018-05-17 DIAGNOSIS — E785 Hyperlipidemia, unspecified: Secondary | ICD-10-CM | POA: Diagnosis not present

## 2018-05-24 ENCOUNTER — Encounter: Payer: Self-pay | Admitting: Sports Medicine

## 2018-05-24 DIAGNOSIS — R059 Cough, unspecified: Secondary | ICD-10-CM

## 2018-05-24 DIAGNOSIS — R05 Cough: Secondary | ICD-10-CM

## 2018-05-24 DIAGNOSIS — E291 Testicular hypofunction: Secondary | ICD-10-CM

## 2018-05-24 DIAGNOSIS — N401 Enlarged prostate with lower urinary tract symptoms: Secondary | ICD-10-CM

## 2018-05-26 MED ORDER — TESTOSTERONE CYPIONATE 200 MG/ML IM SOLN: 200.0000 mg | INTRAMUSCULAR | 0 refills | Status: DC

## 2018-05-26 MED ORDER — FLUTICASONE PROPIONATE 50 MCG/ACT NA SUSP: 1.0000 | Freq: Every day | NASAL | 3 refills | Status: DC

## 2018-05-26 MED ORDER — OMEPRAZOLE 40 MG PO CPDR: DELAYED_RELEASE_CAPSULE | ORAL | 3 refills | Status: DC

## 2018-05-26 MED ORDER — FINASTERIDE 5 MG PO TABS: 5.0000 mg | ORAL_TABLET | Freq: Every evening | ORAL | 3 refills | Status: DC

## 2018-05-26 MED ORDER — AMLODIPINE BESYLATE 10 MG PO TABS: 5.0000 mg | ORAL_TABLET | Freq: Two times a day (BID) | ORAL | 3 refills | Status: DC

## 2018-05-26 MED ORDER — SIMVASTATIN 40 MG PO TABS: 40.0000 mg | ORAL_TABLET | Freq: Every evening | ORAL | 3 refills | Status: DC

## 2018-05-26 MED ORDER — LEVOTHYROXINE SODIUM 75 MCG PO TABS: 75.0000 ug | ORAL_TABLET | Freq: Every day | ORAL | 3 refills | Status: DC

## 2018-05-26 NOTE — Addendum Note (Signed)
Addended by: Silverio Decamp on: 05/26/2018 06:38 PM   Modules accepted: Orders

## 2018-05-26 NOTE — Addendum Note (Signed)
Addended by: Narda Rutherford on: 05/26/2018 02:18 PM   Modules accepted: Orders

## 2018-06-04 ENCOUNTER — Other Ambulatory Visit: Payer: Self-pay | Admitting: Sports Medicine

## 2018-06-04 DIAGNOSIS — E291 Testicular hypofunction: Secondary | ICD-10-CM

## 2018-06-16 DIAGNOSIS — I350 Nonrheumatic aortic (valve) stenosis: Secondary | ICD-10-CM | POA: Diagnosis not present

## 2018-06-16 DIAGNOSIS — I10 Essential (primary) hypertension: Secondary | ICD-10-CM | POA: Diagnosis not present

## 2018-06-16 DIAGNOSIS — I517 Cardiomegaly: Secondary | ICD-10-CM | POA: Diagnosis not present

## 2018-07-01 DIAGNOSIS — R911 Solitary pulmonary nodule: Secondary | ICD-10-CM | POA: Diagnosis not present

## 2018-07-01 DIAGNOSIS — R918 Other nonspecific abnormal finding of lung field: Secondary | ICD-10-CM | POA: Diagnosis not present

## 2018-07-05 ENCOUNTER — Encounter: Payer: Self-pay | Admitting: Sports Medicine

## 2018-07-05 DIAGNOSIS — E291 Testicular hypofunction: Secondary | ICD-10-CM

## 2018-07-06 MED ORDER — TESTOSTERONE CYPIONATE 200 MG/ML IM SOLN: 200.0000 mg | INTRAMUSCULAR | 0 refills | Status: DC

## 2018-07-08 DIAGNOSIS — J4520 Mild intermittent asthma, uncomplicated: Secondary | ICD-10-CM | POA: Diagnosis not present

## 2018-07-08 DIAGNOSIS — J479 Bronchiectasis, uncomplicated: Secondary | ICD-10-CM | POA: Diagnosis not present

## 2018-07-08 DIAGNOSIS — R911 Solitary pulmonary nodule: Secondary | ICD-10-CM | POA: Diagnosis not present

## 2018-07-09 ENCOUNTER — Other Ambulatory Visit: Payer: Self-pay

## 2018-07-09 DIAGNOSIS — E291 Testicular hypofunction: Secondary | ICD-10-CM

## 2018-07-09 MED ORDER — "NEEDLE (DISP) 22G X 1-1/2"" MISC": 0 refills | Status: DC

## 2018-07-09 MED ORDER — "SYRINGE/NEEDLE (DISP) 18G X 1-1/2"" 3 ML MISC": 3 refills | Status: DC

## 2018-07-14 ENCOUNTER — Ambulatory Visit: Payer: Medicare Other

## 2018-09-01 ENCOUNTER — Encounter: Payer: Self-pay | Admitting: Sports Medicine

## 2018-09-02 MED ORDER — TERAZOSIN HCL 5 MG PO CAPS: 5.0000 mg | ORAL_CAPSULE | Freq: Every day | ORAL | 1 refills | Status: DC

## 2018-09-02 MED ORDER — TERAZOSIN HCL 5 MG PO CAPS: 5.0000 mg | ORAL_CAPSULE | Freq: Every day | ORAL | 3 refills | Status: DC

## 2018-09-23 DIAGNOSIS — L719 Rosacea, unspecified: Secondary | ICD-10-CM | POA: Diagnosis not present

## 2018-09-23 DIAGNOSIS — D045 Carcinoma in situ of skin of trunk: Secondary | ICD-10-CM | POA: Diagnosis not present

## 2018-09-23 DIAGNOSIS — D485 Neoplasm of uncertain behavior of skin: Secondary | ICD-10-CM | POA: Diagnosis not present

## 2018-09-23 DIAGNOSIS — L579 Skin changes due to chronic exposure to nonionizing radiation, unspecified: Secondary | ICD-10-CM | POA: Diagnosis not present

## 2018-09-23 DIAGNOSIS — L570 Actinic keratosis: Secondary | ICD-10-CM | POA: Diagnosis not present

## 2018-09-23 DIAGNOSIS — L209 Atopic dermatitis, unspecified: Secondary | ICD-10-CM | POA: Diagnosis not present

## 2018-09-23 DIAGNOSIS — L821 Other seborrheic keratosis: Secondary | ICD-10-CM | POA: Diagnosis not present

## 2018-09-23 DIAGNOSIS — L814 Other melanin hyperpigmentation: Secondary | ICD-10-CM | POA: Diagnosis not present

## 2018-09-23 DIAGNOSIS — D1801 Hemangioma of skin and subcutaneous tissue: Secondary | ICD-10-CM | POA: Diagnosis not present

## 2018-09-23 DIAGNOSIS — D0461 Carcinoma in situ of skin of right upper limb, including shoulder: Secondary | ICD-10-CM | POA: Diagnosis not present

## 2018-10-11 ENCOUNTER — Encounter: Payer: Self-pay | Admitting: Sports Medicine

## 2018-10-11 DIAGNOSIS — N400 Enlarged prostate without lower urinary tract symptoms: Secondary | ICD-10-CM

## 2018-10-11 DIAGNOSIS — E291 Testicular hypofunction: Secondary | ICD-10-CM

## 2018-10-12 MED ORDER — TESTOSTERONE CYPIONATE 200 MG/ML IM SOLN: 200.0000 mg | INTRAMUSCULAR | 0 refills | Status: DC

## 2018-10-12 NOTE — Addendum Note (Signed)
Addended by: Silverio Decamp on: 10/12/2018 08:44 AM   Modules accepted: Orders

## 2018-10-12 NOTE — Assessment & Plan Note (Signed)
Refilling testosterone, recheck testosterone, CBC, PSA 1 week after next injection.

## 2018-10-28 DIAGNOSIS — D045 Carcinoma in situ of skin of trunk: Secondary | ICD-10-CM | POA: Diagnosis not present

## 2018-10-28 DIAGNOSIS — D0461 Carcinoma in situ of skin of right upper limb, including shoulder: Secondary | ICD-10-CM | POA: Diagnosis not present

## 2018-10-28 DIAGNOSIS — D485 Neoplasm of uncertain behavior of skin: Secondary | ICD-10-CM | POA: Diagnosis not present

## 2018-11-08 DIAGNOSIS — H401132 Primary open-angle glaucoma, bilateral, moderate stage: Secondary | ICD-10-CM | POA: Diagnosis not present

## 2018-11-12 ENCOUNTER — Other Ambulatory Visit: Payer: Self-pay

## 2018-11-19 DIAGNOSIS — H401132 Primary open-angle glaucoma, bilateral, moderate stage: Secondary | ICD-10-CM | POA: Diagnosis not present

## 2018-12-03 DIAGNOSIS — L905 Scar conditions and fibrosis of skin: Secondary | ICD-10-CM | POA: Diagnosis not present

## 2018-12-03 DIAGNOSIS — D045 Carcinoma in situ of skin of trunk: Secondary | ICD-10-CM | POA: Diagnosis not present

## 2019-01-18 ENCOUNTER — Encounter: Payer: Self-pay | Admitting: Sports Medicine

## 2019-01-18 DIAGNOSIS — E785 Hyperlipidemia, unspecified: Secondary | ICD-10-CM

## 2019-01-18 DIAGNOSIS — E291 Testicular hypofunction: Secondary | ICD-10-CM

## 2019-01-18 DIAGNOSIS — E039 Hypothyroidism, unspecified: Secondary | ICD-10-CM

## 2019-01-18 DIAGNOSIS — N401 Enlarged prostate with lower urinary tract symptoms: Secondary | ICD-10-CM

## 2019-01-18 DIAGNOSIS — I10 Essential (primary) hypertension: Secondary | ICD-10-CM

## 2019-01-18 MED ORDER — "NEEDLE (DISP) 22G X 1-1/2"" MISC": 0 refills | Status: DC

## 2019-01-18 MED ORDER — "BD LUER-LOK SYRINGE 18G X 1-1/2"" 3 ML MISC": 3 refills | Status: DC

## 2019-01-24 ENCOUNTER — Other Ambulatory Visit: Payer: Self-pay | Admitting: Sports Medicine

## 2019-01-24 ENCOUNTER — Ambulatory Visit (INDEPENDENT_AMBULATORY_CARE_PROVIDER_SITE_OTHER): Payer: Medicare Other | Admitting: Sports Medicine

## 2019-01-24 ENCOUNTER — Other Ambulatory Visit: Payer: Self-pay

## 2019-01-24 ENCOUNTER — Encounter: Payer: Self-pay | Admitting: Sports Medicine

## 2019-01-24 ENCOUNTER — Ambulatory Visit (INDEPENDENT_AMBULATORY_CARE_PROVIDER_SITE_OTHER): Payer: Medicare Other

## 2019-01-24 VITALS — BP 154/76 | HR 88 | Ht 70.0 in | Wt 172.0 lb

## 2019-01-24 DIAGNOSIS — E291 Testicular hypofunction: Secondary | ICD-10-CM | POA: Diagnosis not present

## 2019-01-24 DIAGNOSIS — R42 Dizziness and giddiness: Secondary | ICD-10-CM | POA: Insufficient documentation

## 2019-01-24 DIAGNOSIS — R05 Cough: Secondary | ICD-10-CM

## 2019-01-24 DIAGNOSIS — J4541 Moderate persistent asthma with (acute) exacerbation: Secondary | ICD-10-CM

## 2019-01-24 DIAGNOSIS — Z23 Encounter for immunization: Secondary | ICD-10-CM

## 2019-01-24 DIAGNOSIS — C182 Malignant neoplasm of ascending colon: Secondary | ICD-10-CM

## 2019-01-24 DIAGNOSIS — Z20822 Contact with and (suspected) exposure to covid-19: Secondary | ICD-10-CM | POA: Insufficient documentation

## 2019-01-24 DIAGNOSIS — N400 Enlarged prostate without lower urinary tract symptoms: Secondary | ICD-10-CM | POA: Diagnosis not present

## 2019-01-24 DIAGNOSIS — E785 Hyperlipidemia, unspecified: Secondary | ICD-10-CM | POA: Diagnosis not present

## 2019-01-24 DIAGNOSIS — Z8673 Personal history of transient ischemic attack (TIA), and cerebral infarction without residual deficits: Secondary | ICD-10-CM

## 2019-01-24 DIAGNOSIS — I10 Essential (primary) hypertension: Secondary | ICD-10-CM

## 2019-01-24 DIAGNOSIS — Z0000 Encounter for general adult medical examination without abnormal findings: Secondary | ICD-10-CM | POA: Diagnosis not present

## 2019-01-24 DIAGNOSIS — R059 Cough, unspecified: Secondary | ICD-10-CM

## 2019-01-24 DIAGNOSIS — E538 Deficiency of other specified B group vitamins: Secondary | ICD-10-CM

## 2019-01-24 DIAGNOSIS — E039 Hypothyroidism, unspecified: Secondary | ICD-10-CM

## 2019-01-24 DIAGNOSIS — Z20828 Contact with and (suspected) exposure to other viral communicable diseases: Secondary | ICD-10-CM

## 2019-01-24 DIAGNOSIS — R972 Elevated prostate specific antigen [PSA]: Secondary | ICD-10-CM

## 2019-01-24 MED ORDER — BUDESONIDE-FORMOTEROL FUMARATE 160-4.5 MCG/ACT IN AERO: 2.0000 | INHALATION_SPRAY | Freq: Two times a day (BID) | RESPIRATORY_TRACT | 11 refills | Status: DC

## 2019-01-24 MED ORDER — "BD LUER-LOK SYRINGE 18G X 1-1/2"" 3 ML MISC": 3 refills | Status: DC

## 2019-01-24 MED ORDER — PREDNISONE 50 MG PO TABS: ORAL_TABLET | ORAL | 0 refills | Status: DC

## 2019-01-24 MED ORDER — LISINOPRIL 40 MG PO TABS: 40.0000 mg | ORAL_TABLET | Freq: Every day | ORAL | 3 refills | Status: DC

## 2019-01-24 MED ORDER — FLUTICASONE PROPIONATE 50 MCG/ACT NA SUSP: 1.0000 | Freq: Every day | NASAL | 3 refills | Status: DC

## 2019-01-24 MED ORDER — "NEEDLE (DISP) 22G X 1-1/2"" MISC": 0 refills | Status: DC

## 2019-01-24 MED ORDER — LORATADINE 10 MG PO TABS: 10.0000 mg | ORAL_TABLET | Freq: Every day | ORAL | 3 refills | Status: DC

## 2019-01-24 MED ORDER — MONTELUKAST SODIUM 10 MG PO TABS: 10.0000 mg | ORAL_TABLET | Freq: Every day | ORAL | 3 refills | Status: DC

## 2019-01-24 NOTE — Assessment & Plan Note (Signed)
Adding COVID antibodies

## 2019-01-24 NOTE — Assessment & Plan Note (Signed)
History of lacunar stroke. He is now having an increase in dizziness over the past several weeks, we are going to proceed with a brain MRI without contrast done this afternoon.

## 2019-01-24 NOTE — Assessment & Plan Note (Signed)
Does desire to come off of testosterone.

## 2019-01-24 NOTE — Assessment & Plan Note (Signed)
Rechecking lipids. 

## 2019-01-24 NOTE — Assessment & Plan Note (Signed)
I think he is getting some orthostasis with amlodipine, switching to high-dose lisinopril. Return in 2 weeks and a nurse visit blood pressure check.

## 2019-01-24 NOTE — Assessment & Plan Note (Signed)
There is likely a vertiginous element, 5 days of prednisone, vestibular rehab. No other symptoms to suggest Mnire's, labyrinthitis, or vestibular neuronitis.

## 2019-01-24 NOTE — Assessment & Plan Note (Signed)
Flu shot given today

## 2019-01-24 NOTE — Assessment & Plan Note (Signed)
Checking B12 levels.

## 2019-01-24 NOTE — Assessment & Plan Note (Signed)
Rechecking TSH 

## 2019-01-24 NOTE — Progress Notes (Deleted)
Subjective:    CC:   HPI:   I reviewed the past medical history, family history, social history, surgical history, and allergies today and no changes were needed.  Please see the problem list section below in epic for further details.  Past Medical History: Past Medical History:  Diagnosis Date  . Aortic stenosis    mild AS 09/2015 echo  . Aortic valve disease    mild  . Asthma    as child  . Basal ganglia hemorrhage (HCC)    left basal ganlia intraparenchymal hemoorhage 06/17/16 (in the setting of HTN emergency)  . Colon cancer ([place]) 10/10/2015  . GERD (gastroesophageal reflux disease)   . Hyperlipidemia   . Hypertension   . Hypothyroidism   . Iron deficiency anemia due to chronic blood loss 10/02/2015  . Lower GI bleed 10/02/2015  . Skin cancer   . Thyroid disease   . TIA (transient ischemic attack)    Past Surgical History: Past Surgical History:  Procedure Laterality Date  . COLON RESECTION     ca  . EYE SURGERY    . JOINT REPLACEMENT     bil  . REPLACEMENT TOTAL KNEE    . TOTAL HIP ARTHROPLASTY Right 10/03/2016  . TOTAL HIP ARTHROPLASTY Right 10/03/2016   Procedure: TOTAL HIP ARTHROPLASTY ANTERIOR APPROACH;  Surgeon: Dorna Leitz, MD;  Location: [facility];  Service: Orthopedics;  Laterality: Right;   Social History: Social History   Socioeconomic History  . Marital status: Married    Spouse name: Not on file  . Number of children: Not on file  . Years of education: Not on file  . Highest education level: Not on file  Occupational History  . Not on file  Social Needs  . Financial resource strain: Not on file  . Food insecurity    Worry: Not on file    Inability: Not on file  . Transportation needs    Medical: Not on file    Non-medical: Not on file  Tobacco Use  . Smoking status: Never Smoker  . Smokeless tobacco: Never Used  Substance and Sexual Activity  . Alcohol use: No  . Drug use: No  . Sexual activity: Yes  Lifestyle  . Physical activity   Days per week: Not on file    Minutes per session: Not on file  . Stress: Not on file  Relationships  . Social Herbalist on phone: Not on file    Gets together: Not on file    Attends religious service: Not on file    Active member of club or organization: Not on file    Attends meetings of clubs or organizations: Not on file    Relationship status: Not on file  Other Topics Concern  . Not on file  Social History Narrative  . Not on file   Family History: No family history on file. Allergies: Allergies  Allergen Reactions  . Tamiflu [Oseltamivir] Shortness Of Breath    Actual allergic reaction to Tamiflu   Medications: See med rec.  Review of Systems: No fevers, chills, night sweats, weight loss, chest pain, or shortness of breath.   Objective:    General: Well Developed, well nourished, and in no acute distress.  Neuro: Alert and oriented x3, extra-ocular muscles intact, sensation grossly intact.  HEENT: Normocephalic, atraumatic, pupils equal round reactive to light, neck supple, no masses, no lymphadenopathy, thyroid nonpalpable.  Skin: Warm and dry, no rashes. Cardiac: Regular rate and rhythm, no  murmurs rubs or gallops, no lower extremity edema.  Respiratory: Clear to auscultation bilaterally. Not using accessory muscles, speaking in full sentences.   Impression and Recommendations:    History of stroke History of lacunar stroke. He is now having an increase in dizziness over the past several weeks, we are going to proceed with a brain MRI without contrast done this afternoon.   Annual physical exam Flu shot given today.  Colon cancer (HCC) Checking B12 levels.  Essential hypertension, benign I think he is getting some orthostasis with amlodipine, switching to high-dose lisinopril. Return in 2 weeks and a nurse visit blood pressure check.  Hyperlipidemia LDL goal <100 Rechecking lipids  Hypothyroidism Rechecking TSH  Male hypogonadism Does  desire to come off of testosterone.  Dizziness There is likely a vertiginous element, 5 days of prednisone, vestibular rehab. No other symptoms to suggest Mnire's, labyrinthitis, or vestibular neuronitis.  Exposure to COVID-19 virus Adding COVID antibodies   ___________________________________________ Gwen Her. Dianah Field, M.D., ABFM., CAQSM. Primary Care and Sports Medicine [facility]  Adjunct Professor of [employer] of [employer]

## 2019-01-24 NOTE — Progress Notes (Signed)
Subjective:    CC: Episodic dizzyness  HPI: Marcus Orr is a pleasant 85 yo male with a history significant for stroke and HTN presenting today with one year of periodic episodes of dizziness, loss of coordination, and nausea. He reports no changes in vision or syncopal episodes after standing or while walking. His symptoms are resolved by sitting and he has not had any falls. He reports home BP readings are often high (systolic [id number]) in the morning and low (systolic 0000000) during the day after taking amlodipine. He has also reported the need for a COVID-19 test due to exposure. He has experienced SOB which has limited his activity at times but states that this has been ongoing for several years. He has had no chest pain.  I reviewed the past medical history, family history, social history, surgical history, and allergies today and no changes were needed.  Please see the problem list section below in epic for further details.  Past Medical History: Past Medical History:  Diagnosis Date  . Aortic stenosis    mild AS 09/2015 echo  . Aortic valve disease    mild  . Asthma    as child  . Basal ganglia hemorrhage (HCC)    left basal ganlia intraparenchymal hemoorhage 06/17/16 (in the setting of HTN emergency)  . Colon cancer ([place]) 10/10/2015  . GERD (gastroesophageal reflux disease)   . Hyperlipidemia   . Hypertension   . Hypothyroidism   . Iron deficiency anemia due to chronic blood loss 10/02/2015  . Lower GI bleed 10/02/2015  . Skin cancer   . Thyroid disease   . TIA (transient ischemic attack)    Past Surgical History: Past Surgical History:  Procedure Laterality Date  . COLON RESECTION     ca  . EYE SURGERY    . JOINT REPLACEMENT     bil  . REPLACEMENT TOTAL KNEE    . TOTAL HIP ARTHROPLASTY Right 10/03/2016  . TOTAL HIP ARTHROPLASTY Right 10/03/2016   Procedure: TOTAL HIP ARTHROPLASTY ANTERIOR APPROACH;  Surgeon: Dorna Leitz, MD;  Location: [facility];  Service: Orthopedics;  Laterality:  Right;   Social History: Social History   Socioeconomic History  . Marital status: Married    Spouse name: Not on file  . Number of children: Not on file  . Years of education: Not on file  . Highest education level: Not on file  Occupational History  . Not on file  Social Needs  . Financial resource strain: Not on file  . Food insecurity    Worry: Not on file    Inability: Not on file  . Transportation needs    Medical: Not on file    Non-medical: Not on file  Tobacco Use  . Smoking status: Never Smoker  . Smokeless tobacco: Never Used  Substance and Sexual Activity  . Alcohol use: No  . Drug use: No  . Sexual activity: Yes  Lifestyle  . Physical activity    Days per week: Not on file    Minutes per session: Not on file  . Stress: Not on file  Relationships  . Social Herbalist on phone: Not on file    Gets together: Not on file    Attends religious service: Not on file    Active member of club or organization: Not on file    Attends meetings of clubs or organizations: Not on file    Relationship status: Not on file  Other Topics Concern  . Not  on file  Social History Narrative  . Not on file   Family History: No family history on file. Allergies: Allergies  Allergen Reactions  . Tamiflu [Oseltamivir] Shortness Of Breath    Actual allergic reaction to Tamiflu   Medications: See med rec.  Review of Systems: No fevers, chills, night sweats, weight loss, chest pain, or shortness of breath.   Objective:    General: Well Developed, well nourished, and in no acute distress.  Neuro: Alert and oriented x3, extra-ocular muscles intact, sensation grossly intact.  HEENT: Normocephalic, atraumatic, pupils equal round reactive to light.  Skin: Warm and dry, no rashes. Cardiac: Regular rate and rhythm, no murmurs rubs or gallops, no lower extremity edema.  Respiratory: Clear to auscultation bilaterally. Not using accessory muscles, speaking in full  sentences.  A/P: Dashiell's symptoms of episodic vertigo in the setting of a prior stroke and HTN is concerning for a subsequent vestibular lacunar stroke. Because of this we would like to get an MRI to evaluate for infarct. Benign Postural Vertigo is also high on the differential and he has been referred to vestibular therapy for management of his symptoms. HTN therapy has been switched from Amlodipine to Lisinopril with the hopes of stable BP control.   Impression and Recommendations:    History of stroke History of lacunar stroke. He is now having an increase in dizziness over the past several weeks, we are going to proceed with a brain MRI without contrast done this afternoon.   Annual physical exam Flu shot given today.  Colon cancer (HCC) Checking B12 levels.  Essential hypertension, benign I think he is getting some orthostasis with amlodipine, switching to high-dose lisinopril. Return in 2 weeks and a nurse visit blood pressure check.  Hyperlipidemia LDL goal <100 Rechecking lipids  Hypothyroidism Rechecking TSH  Male hypogonadism Does desire to come off of testosterone.  Dizziness There is likely a vertiginous element, 5 days of prednisone, vestibular rehab. No other symptoms to suggest Mnire's, labyrinthitis, or vestibular neuronitis.  Exposure to COVID-19 virus Adding COVID antibodies   ___________________________________________ Gwen Her. Dianah Field, M.D., ABFM., CAQSM. Primary Care and Sports Medicine [facility]  Adjunct Professor of [place] [employer] [facility] [employer]

## 2019-01-25 DIAGNOSIS — R972 Elevated prostate specific antigen [PSA]: Secondary | ICD-10-CM | POA: Insufficient documentation

## 2019-01-25 LAB — COMPLETE METABOLIC PANEL WITH GFR
AG Ratio: 1.5 (calc) (ref 1.0–2.5)
ALT: 13 U/L (ref 9–46)
AST: 16 U/L (ref 10–35)
Albumin: 4 g/dL (ref 3.6–5.1)
Alkaline phosphatase (APISO): 66 U/L (ref 35–144)
BUN/Creatinine Ratio: 12 (calc) (ref 6–22)
BUN: 20 mg/dL (ref 7–25)
CO2: 29 mmol/L (ref 20–32)
Calcium: 9.2 mg/dL (ref 8.6–10.3)
Chloride: 103 mmol/L (ref 98–110)
Creat: 1.65 mg/dL — ABNORMAL HIGH (ref 0.70–1.11)
GFR, Est African American: 44 mL/min/{1.73_m2} — ABNORMAL LOW (ref >=60)
GFR, Est Non African American: 38 mL/min/{1.73_m2} — ABNORMAL LOW (ref >=60)
Globulin: 2.7 g/dL (calc) (ref 1.9–3.7)
Glucose, Bld: 92 mg/dL (ref 65–99)
Potassium: 4.4 mmol/L (ref 3.5–5.3)
Sodium: 139 mmol/L (ref 135–146)
Total Bilirubin: 0.5 mg/dL (ref 0.2–1.2)
Total Protein: 6.7 g/dL (ref 6.1–8.1)

## 2019-01-25 LAB — CBC
HCT: 38.3 % — ABNORMAL LOW (ref 38.5–50.0)
Hemoglobin: 12.8 g/dL — ABNORMAL LOW (ref 13.2–17.1)
MCH: 28.7 pg (ref 27.0–33.0)
MCHC: 33.4 g/dL (ref 32.0–36.0)
MCV: 85.9 fL (ref 80.0–100.0)
MPV: 9.8 fL (ref 7.5–12.5)
Platelets: 303 10*3/uL (ref 140–400)
RBC: 4.46 10*6/uL (ref 4.20–5.80)
RDW: 13.7 % (ref 11.0–15.0)
WBC: 7.7 10*3/uL (ref 3.8–10.8)

## 2019-01-25 LAB — PSA, TOTAL AND FREE
PSA, Free: 3.6 ng/mL
PSA, Total: 11 ng/mL — ABNORMAL HIGH (ref <=4.0)

## 2019-01-25 LAB — TSH: TSH: 4.08 mIU/L (ref 0.40–4.50)

## 2019-01-25 LAB — LIPID PANEL W/REFLEX DIRECT LDL
Cholesterol: 90 mg/dL (ref <200)
HDL: 37 mg/dL — ABNORMAL LOW (ref >=40)
LDL Cholesterol (Calc): 36 mg/dL (calc)
Non-HDL Cholesterol (Calc): 53 mg/dL (calc) (ref <130)
Total CHOL/HDL Ratio: 2.4 (calc) (ref <5.0)
Triglycerides: 86 mg/dL (ref <150)

## 2019-01-25 LAB — VITAMIN B12: Vitamin B-12: 764 pg/mL (ref 200–1100)

## 2019-01-25 MED ORDER — MONTELUKAST SODIUM 10 MG PO TABS: 10.0000 mg | ORAL_TABLET | Freq: Every day | ORAL | 3 refills | Status: DC

## 2019-01-25 MED ORDER — PREDNISONE 50 MG PO TABS: ORAL_TABLET | ORAL | 0 refills | Status: DC

## 2019-01-25 MED ORDER — LISINOPRIL 40 MG PO TABS: 40.0000 mg | ORAL_TABLET | Freq: Every day | ORAL | 3 refills | Status: DC

## 2019-01-25 NOTE — Assessment & Plan Note (Signed)
PSA has increased from 5 to 11 in about 9 months, for this reason I do think he needs to see a urologist.

## 2019-01-27 ENCOUNTER — Encounter: Payer: Self-pay | Admitting: Sports Medicine

## 2019-01-27 DIAGNOSIS — I10 Essential (primary) hypertension: Secondary | ICD-10-CM

## 2019-01-27 DIAGNOSIS — N529 Male erectile dysfunction, unspecified: Secondary | ICD-10-CM

## 2019-01-28 MED ORDER — HYDROCHLOROTHIAZIDE 25 MG PO TABS: 25.0000 mg | ORAL_TABLET | Freq: Every day | ORAL | 1 refills | Status: DC

## 2019-01-28 NOTE — Assessment & Plan Note (Signed)
Blood pressures continue to be markedly elevated, we will continue 40 mg daily for now, hydrochlorothiazide will be added, he will keep me updated with his blood pressures through the weekend. They have been in the range of 123456 systolic.

## 2019-01-28 NOTE — Addendum Note (Signed)
Addended by: Silverio Decamp on: 01/28/2019 03:48 PM   Modules accepted: Orders

## 2019-01-29 ENCOUNTER — Encounter: Payer: Self-pay | Admitting: Sports Medicine

## 2019-01-31 NOTE — Telephone Encounter (Signed)
Discussed with patient, blood pressures dropped to [id number] to 0000000 systolic over the weekend.  Continue current medications.

## 2019-02-04 ENCOUNTER — Ambulatory Visit (INDEPENDENT_AMBULATORY_CARE_PROVIDER_SITE_OTHER): Payer: Medicare Other | Admitting: Sports Medicine

## 2019-02-04 ENCOUNTER — Other Ambulatory Visit: Payer: Self-pay

## 2019-02-04 VITALS — BP 150/81 | HR 72 | Ht 70.0 in | Wt 169.0 lb

## 2019-02-04 DIAGNOSIS — I10 Essential (primary) hypertension: Secondary | ICD-10-CM | POA: Diagnosis not present

## 2019-02-04 NOTE — Progress Notes (Signed)
   Subjective:    Patient ID: Marcus Orr., male    DOB: February 04, 1934, 85 y.o.   MRN: [id number]  HPI Patient is here for blood pressure check. Denies trouble sleeping, palpitations, or medication problems.   Does report occasional light headedness.    Review of Systems     Objective:   Physical Exam        Assessment & Plan:  Blood pressure was elevated on initial check at 150/81 but after 5 minutes of rest came down to 126/81.   Patient was given his handicap forms he had dropped off.   Patient advised that provider recommends to continue current medications and stay hydrated. Patient will call office if lightheadedness persist.

## 2019-02-24 DIAGNOSIS — D0471 Carcinoma in situ of skin of right lower limb, including hip: Secondary | ICD-10-CM | POA: Diagnosis not present

## 2019-02-24 DIAGNOSIS — L570 Actinic keratosis: Secondary | ICD-10-CM | POA: Diagnosis not present

## 2019-02-24 DIAGNOSIS — L821 Other seborrheic keratosis: Secondary | ICD-10-CM | POA: Diagnosis not present

## 2019-02-24 DIAGNOSIS — D485 Neoplasm of uncertain behavior of skin: Secondary | ICD-10-CM | POA: Diagnosis not present

## 2019-02-24 DIAGNOSIS — L579 Skin changes due to chronic exposure to nonionizing radiation, unspecified: Secondary | ICD-10-CM | POA: Diagnosis not present

## 2019-02-24 DIAGNOSIS — L820 Inflamed seborrheic keratosis: Secondary | ICD-10-CM | POA: Diagnosis not present

## 2019-02-24 DIAGNOSIS — L129 Pemphigoid, unspecified: Secondary | ICD-10-CM | POA: Diagnosis not present

## 2019-02-24 DIAGNOSIS — L814 Other melanin hyperpigmentation: Secondary | ICD-10-CM | POA: Diagnosis not present

## 2019-02-24 DIAGNOSIS — D1801 Hemangioma of skin and subcutaneous tissue: Secondary | ICD-10-CM | POA: Diagnosis not present

## 2019-02-24 DIAGNOSIS — Z85828 Personal history of other malignant neoplasm of skin: Secondary | ICD-10-CM | POA: Diagnosis not present

## 2019-03-24 DIAGNOSIS — D0471 Carcinoma in situ of skin of right lower limb, including hip: Secondary | ICD-10-CM | POA: Diagnosis not present

## 2019-05-03 DIAGNOSIS — R972 Elevated prostate specific antigen [PSA]: Secondary | ICD-10-CM | POA: Diagnosis not present

## 2019-05-03 DIAGNOSIS — N401 Enlarged prostate with lower urinary tract symptoms: Secondary | ICD-10-CM | POA: Diagnosis not present

## 2019-05-03 DIAGNOSIS — R3912 Poor urinary stream: Secondary | ICD-10-CM | POA: Diagnosis not present

## 2019-05-12 DIAGNOSIS — Z23 Encounter for immunization: Secondary | ICD-10-CM | POA: Diagnosis not present

## 2019-05-21 ENCOUNTER — Other Ambulatory Visit: Payer: Self-pay | Admitting: Sports Medicine

## 2019-05-23 DIAGNOSIS — I10 Essential (primary) hypertension: Secondary | ICD-10-CM | POA: Diagnosis not present

## 2019-05-23 DIAGNOSIS — Z79899 Other long term (current) drug therapy: Secondary | ICD-10-CM | POA: Diagnosis not present

## 2019-05-23 DIAGNOSIS — E785 Hyperlipidemia, unspecified: Secondary | ICD-10-CM | POA: Diagnosis not present

## 2019-05-23 DIAGNOSIS — I350 Nonrheumatic aortic (valve) stenosis: Secondary | ICD-10-CM | POA: Diagnosis not present

## 2019-05-23 DIAGNOSIS — Z8679 Personal history of other diseases of the circulatory system: Secondary | ICD-10-CM | POA: Diagnosis not present

## 2019-05-29 ENCOUNTER — Ambulatory Visit: Payer: Medicare Other

## 2019-06-07 DIAGNOSIS — Z23 Encounter for immunization: Secondary | ICD-10-CM | POA: Diagnosis not present

## 2019-06-30 DIAGNOSIS — N529 Male erectile dysfunction, unspecified: Secondary | ICD-10-CM

## 2019-06-30 MED ORDER — TADALAFIL 10 MG PO TABS: 10.0000 mg | ORAL_TABLET | Freq: Every day | ORAL | 0 refills | Status: DC | PRN

## 2019-07-08 DIAGNOSIS — D045 Carcinoma in situ of skin of trunk: Secondary | ICD-10-CM | POA: Diagnosis not present

## 2019-07-08 DIAGNOSIS — C44519 Basal cell carcinoma of skin of other part of trunk: Secondary | ICD-10-CM | POA: Diagnosis not present

## 2019-07-08 DIAGNOSIS — L821 Other seborrheic keratosis: Secondary | ICD-10-CM | POA: Diagnosis not present

## 2019-07-08 DIAGNOSIS — L578 Other skin changes due to chronic exposure to nonionizing radiation: Secondary | ICD-10-CM | POA: Diagnosis not present

## 2019-07-08 DIAGNOSIS — D485 Neoplasm of uncertain behavior of skin: Secondary | ICD-10-CM | POA: Diagnosis not present

## 2019-07-09 ENCOUNTER — Other Ambulatory Visit: Payer: Self-pay | Admitting: Sports Medicine

## 2019-07-09 DIAGNOSIS — N401 Enlarged prostate with lower urinary tract symptoms: Secondary | ICD-10-CM

## 2019-07-18 DIAGNOSIS — R918 Other nonspecific abnormal finding of lung field: Secondary | ICD-10-CM | POA: Diagnosis not present

## 2019-07-18 DIAGNOSIS — R911 Solitary pulmonary nodule: Secondary | ICD-10-CM | POA: Diagnosis not present

## 2019-07-20 ENCOUNTER — Other Ambulatory Visit: Payer: Self-pay | Admitting: Sports Medicine

## 2019-07-26 ENCOUNTER — Encounter: Payer: Self-pay | Admitting: Sports Medicine

## 2019-07-26 ENCOUNTER — Ambulatory Visit (INDEPENDENT_AMBULATORY_CARE_PROVIDER_SITE_OTHER): Payer: Medicare Other | Admitting: Sports Medicine

## 2019-07-26 ENCOUNTER — Other Ambulatory Visit: Payer: Self-pay

## 2019-07-26 VITALS — BP 156/91 | HR 63 | Ht 70.0 in | Wt 171.0 lb

## 2019-07-26 DIAGNOSIS — N529 Male erectile dysfunction, unspecified: Secondary | ICD-10-CM | POA: Diagnosis not present

## 2019-07-26 DIAGNOSIS — N400 Enlarged prostate without lower urinary tract symptoms: Secondary | ICD-10-CM | POA: Diagnosis not present

## 2019-07-26 DIAGNOSIS — I10 Essential (primary) hypertension: Secondary | ICD-10-CM

## 2019-07-26 DIAGNOSIS — J4541 Moderate persistent asthma with (acute) exacerbation: Secondary | ICD-10-CM

## 2019-07-26 DIAGNOSIS — R918 Other nonspecific abnormal finding of lung field: Secondary | ICD-10-CM | POA: Diagnosis not present

## 2019-07-26 DIAGNOSIS — H6123 Impacted cerumen, bilateral: Secondary | ICD-10-CM | POA: Insufficient documentation

## 2019-07-26 DIAGNOSIS — Z0000 Encounter for general adult medical examination without abnormal findings: Secondary | ICD-10-CM

## 2019-07-26 MED ORDER — TADALAFIL 10 MG PO TABS: 10.0000 mg | ORAL_TABLET | Freq: Every day | ORAL | 11 refills | Status: AC | PRN

## 2019-07-26 NOTE — Assessment & Plan Note (Addendum)
Last year we noted a PSA elevated over 10, he had it rechecked with his urologist and it was down into the ones. No further intervention needed, he likely had a low-grade prostatitis at the time. Refilling Cialis with good Rx coupon to [facility].

## 2019-07-26 NOTE — Assessment & Plan Note (Signed)
Tesean has relatively labile blood pressures, today it is in the Q000111Q systolic, it dropped to the 90s frequently. We are not going to augment his blood pressure medications, he is asymptomatic now, at 86 years old we will probably just leave this alone. He has gotten orthostatic to the point where he fell.

## 2019-07-26 NOTE — Assessment & Plan Note (Signed)
Marcus Orr is doing okay, he is only using his Symbicort once a day, he was informed to increase his use to 2 puffs twice a day. He also had a CT scan with his pulmonologist that showed a new pulmonary nodule that needs follow-up in about 6 months.  This will be July 2021.

## 2019-07-26 NOTE — Assessment & Plan Note (Signed)
Bilateral irrigation, he is having some hearing loss related to cerumen impactions.

## 2019-07-26 NOTE — Assessment & Plan Note (Signed)
New pulmonary nodule noted, needs repeat chest CT in July 2021.

## 2019-07-26 NOTE — Progress Notes (Signed)
Subjective:   Marcus PICKELL Sr. is a 86 y.o. male who presents for Medicare Annual/Subsequent preventive examination.  Review of systems is negative except as noted below    Objective:    Vitals: BP (!) 156/91   Pulse 63   Ht 5\' 10"  (1.778 m)   Wt 171 lb (77.6 kg)   BMI 24.54 kg/m   Body mass index is 24.54 kg/m.  Advanced Directives 10/03/2016 09/29/2016 10/12/2015 10/10/2015 08/30/2013  Does Patient Have a Medical Advance Directive? No No No No Patient does not have advance directive;Patient would like information  Would patient like information on creating a medical advance directive? No - Patient declined - No - patient declined information - Advance directive packet given    Tobacco Social History   Tobacco Use  Smoking Status Never Smoker  Smokeless Tobacco Never Used     Counseling given: Not Answered   Past Medical History:  Diagnosis Date  . Aortic stenosis    mild AS 09/2015 echo  . Aortic valve disease    mild  . Asthma    as child  . Basal ganglia hemorrhage (HCC)    left basal ganlia intraparenchymal hemoorhage 06/17/16 (in the setting of HTN emergency)  . Colon cancer ([place]) 10/10/2015  . GERD (gastroesophageal reflux disease)   . Hyperlipidemia   . Hypertension   . Hypothyroidism   . Iron deficiency anemia due to chronic blood loss 10/02/2015  . Lower GI bleed 10/02/2015  . Skin cancer   . Thyroid disease   . TIA (transient ischemic attack)    Past Surgical History:  Procedure Laterality Date  . COLON RESECTION     ca  . EYE SURGERY    . JOINT REPLACEMENT     bil  . REPLACEMENT TOTAL KNEE    . TOTAL HIP ARTHROPLASTY Right 10/03/2016  . TOTAL HIP ARTHROPLASTY Right 10/03/2016   Procedure: TOTAL HIP ARTHROPLASTY ANTERIOR APPROACH;  Surgeon: Dorna Leitz, MD;  Location: [facility];  Service: Orthopedics;  Laterality: Right;   No family history on file. Social History   Socioeconomic History  . Marital status: Married    Spouse name: Not on file   . Number of children: Not on file  . Years of education: Not on file  . Highest education level: Not on file  Occupational History  . Not on file  Tobacco Use  . Smoking status: Never Smoker  . Smokeless tobacco: Never Used  Substance and Sexual Activity  . Alcohol use: No  . Drug use: No  . Sexual activity: Yes  Other Topics Concern  . Not on file  Social History Narrative  . Not on file   Social Determinants of Health   Financial Resource Strain:   . Difficulty of Paying Living Expenses:   Food Insecurity:   . Worried About Charity fundraiser in the Last Year:   . Arboriculturist in the Last Year:   Transportation Needs:   . Film/video editor (Medical):   Marland Kitchen Lack of Transportation (Non-Medical):   Physical Activity:   . Days of Exercise per Week:   . Minutes of Exercise per Session:   Stress:   . Feeling of Stress :   Social Connections:   . Frequency of Communication with Friends and Family:   . Frequency of Social Gatherings with Friends and Family:   . Attends Religious Services:   . Active Member of Clubs or Organizations:   .  Attends Archivist Meetings:   Marland Kitchen Marital Status:     Outpatient Encounter Medications as of 07/26/2019  Medication Sig  . albuterol (PROVENTIL HFA;VENTOLIN HFA) 108 (90 Base) MCG/ACT inhaler Inhale 2 puffs into the lungs every 6 (six) hours as needed for wheezing.  . budesonide-formoterol (SYMBICORT) 160-4.5 MCG/ACT inhaler Inhale 2 puffs into the lungs 2 (two) times daily.  . Cobalamine Combinations (FOLIC + 123456) Q000111Q MCG TABS Take 1 tablet by mouth daily.  . dorzolamide-timolol (COSOPT) 22.3-6.8 MG/ML ophthalmic solution Place 1 drop into both eyes 2 (two) times daily.  . ferrous sulfate 325 (65 FE) MG EC tablet Take 1 tablet (325 mg total) by mouth 3 (three) times daily with meals.  . finasteride (PROSCAR) 5 MG tablet TAKE 1 TABLET EVERY EVENING  . fluticasone (FLONASE) 50 MCG/ACT nasal spray Place 1 spray into both  nostrils daily.  . hydrochlorothiazide (HYDRODIURIL) 25 MG tablet Take 1 tablet (25 mg total) by mouth daily.  Marland Kitchen lisinopril (ZESTRIL) 40 MG tablet Take 1 tablet (40 mg total) by mouth daily.  Marland Kitchen loratadine (CLARITIN) 10 MG tablet Take 1 tablet (10 mg total) by mouth daily.  . montelukast (SINGULAIR) 10 MG tablet Take 1 tablet (10 mg total) by mouth at bedtime.  Marland Kitchen NEEDLE, DISP, 22 G 22G X 1-1/2" MISC Inject into muscle every 14 days.  Marland Kitchen nystatin (MYCOSTATIN) 100000 UNIT/ML suspension Take 5 mLs (500,000 Units total) by mouth 4 (four) times daily. Swish for 30 seconds and spit out.  Marland Kitchen omeprazole (PRILOSEC) 40 MG capsule TAKE 1 CAPSULE AT BEDTIME  . simvastatin (ZOCOR) 40 MG tablet TAKE 1 TABLET EVERY EVENING  . SYNTHROID 75 MCG tablet TAKE 1 TABLET DAILY  . SYRINGE-NEEDLE, DISP, 3 ML (B-D 3CC LUER-LOK SYR 18GX1-1/2) 18G X 1-1/2" 3 ML MISC USE AS DIRECTED TO DRAW UP TESTOSTERONE  . tadalafil (CIALIS) 10 MG tablet Take 1 tablet (10 mg total) by mouth daily as needed.  . terazosin (HYTRIN) 5 MG capsule Take 1 capsule (5 mg total) by mouth at bedtime.  . vitamin B-12 (CYANOCOBALAMIN) 1000 MCG tablet Take 1 tablet (1,000 mcg total) by mouth daily.  . [DISCONTINUED] predniSONE (DELTASONE) 50 MG tablet One tab PO daily for 5 days.  . [DISCONTINUED] tadalafil (CIALIS) 10 MG tablet Take 1 tablet (10 mg total) by mouth daily as needed for erectile dysfunction.   No facility-administered encounter medications on file as of 07/26/2019.    Activities of Daily Living No flowsheet data found.  Patient Care Team: Silverio Decamp, MD as PCP - General (Family Medicine)   Assessment:   This is a routine wellness examination for [facility].  Exercise Activities and Dietary recommendations None    Goals   None     Fall Risk Fall Risk  11/12/2018 10/12/2015 10/10/2015 10/02/2015 01/04/2015  Falls in the past year? (No Data) No No No No  Comment Emmi Telephone Survey: data to providers prior to load - - - -   Number falls in past yr: (No Data) - - - -  Comment Emmi Telephone Survey Actual Response =  - - - -   Is the patient's home free of loose throw rugs in walkways, pet beds, electrical cords, etc?   yes      Grab bars in the bathroom? no      Handrails on the stairs?   yes      Adequate lighting?   yes  Timed Get Up and Go Performed: Not needed  Depression  Screen PHQ 2/9 Scores 11/02/2017 01/04/2015  PHQ - 2 Score 0 0  PHQ- 9 Score 3 -    Cognitive Function appropriate      Immunization History  Administered Date(s) Administered  . Fluad Quad(high Dose 65+) 01/24/2019  . Influenza Whole 04/15/2011  . Influenza, High Dose Seasonal PF 01/01/2017, 01/24/2018  . Influenza,inj,Quad PF,6+ Mos 01/13/2013, 01/04/2015  . PFIZER SARS-COV-2 Vaccination 05/12/2019, 06/07/2019  . Pneumococcal Conjugate-13 01/04/2015  . Pneumococcal Polysaccharide-23 06/20/2016    Qualifies for Shingles Vaccine? Yes  Screening Tests Health Maintenance  Topic Date Due  . TETANUS/TDAP  04/14/2018  . INFLUENZA VACCINE  11/13/2019  . PNA vac Low Risk Adult  Completed   Cancer Screenings: Lung: Low Dose CT Chest recommended if Age 20-80 years, 30 pack-year currently smoking OR have quit w/in 15years. Patient does not qualify. Colorectal: UTD  Indication: Cerumen impaction of the left ear(s) Medical necessity statement: On physical examination, cerumen impairs clinically significant portions of the external auditory canal, and tympanic membrane. Noted obstructive, copious cerumen that cannot be removed without magnification and instrumentations requiring physician skills Consent: Discussed benefits and risks of procedure and verbal consent obtained Procedure: Patient was prepped for the procedure. Utilized an otoscope to assess and take note of the ear canal, the tympanic membrane, and the presence, amount, and placement of the cerumen. Gentle water irrigation and soft plastic curette was utilized to remove  cerumen.  Post procedure examination: shows cerumen was completely removed. Patient tolerated procedure well. The patient is made aware that they may experience temporary vertigo, temporary hearing loss, and temporary discomfort. If these symptom last for more than 24 hours to call the clinic or proceed to the ED.    Plan:   Asthmatic bronchitis Tydell is doing okay, he is only using his Symbicort once a day, he was informed to increase his use to 2 puffs twice a day. He also had a CT scan with his pulmonologist that showed a new pulmonary nodule that needs follow-up in about 6 months.  This will be July 2021.  BPH (benign prostatic hyperplasia) Last year we noted a PSA elevated over 10, he had it rechecked with his urologist and it was down into the ones. No further intervention needed, he likely had a low-grade prostatitis at the time. Refilling Cialis with good Rx coupon to [facility].  Essential hypertension, benign Javario has relatively labile blood pressures, today it is in the Q000111Q systolic, it dropped to the 90s frequently. We are not going to augment his blood pressure medications, he is asymptomatic now, at 86 years old we will probably just leave this alone. He has gotten orthostatic to the point where he fell.  Pulmonary nodules New pulmonary nodule noted, needs repeat chest CT in July 2021.  Bilateral hearing loss due to cerumen impaction Bilateral irrigation, he is having some hearing loss related to cerumen impactions.    I have personally reviewed and noted the following in the patient's chart:   . Medical and social history . Use of alcohol, tobacco or illicit drugs  . Current medications and supplements . Functional ability and status . Nutritional status . Physical activity . Advanced directives . List of other physicians . Hospitalizations, surgeries, and ER visits in previous 12 months . Vitals . Screenings to include cognitive, depression, and  falls . Referrals and appointments  In addition, I have reviewed and discussed with patient certain preventive protocols, quality metrics, and best practice recommendations. A written personalized care plan for preventive  services as well as general preventive health recommendations were provided to patient.     Aundria Mems, MD  07/26/2019

## 2019-08-01 ENCOUNTER — Other Ambulatory Visit: Payer: Self-pay | Admitting: Sports Medicine

## 2019-08-01 DIAGNOSIS — R05 Cough: Secondary | ICD-10-CM

## 2019-08-01 DIAGNOSIS — R059 Cough, unspecified: Secondary | ICD-10-CM

## 2019-08-04 MED ORDER — LEVOTHYROXINE SODIUM 75 MCG PO TABS: 75.0000 ug | ORAL_TABLET | Freq: Every day | ORAL | 0 refills | Status: DC

## 2019-09-05 DIAGNOSIS — I350 Nonrheumatic aortic (valve) stenosis: Secondary | ICD-10-CM | POA: Diagnosis not present

## 2019-09-05 DIAGNOSIS — I071 Rheumatic tricuspid insufficiency: Secondary | ICD-10-CM | POA: Diagnosis not present

## 2019-09-06 DIAGNOSIS — D045 Carcinoma in situ of skin of trunk: Secondary | ICD-10-CM | POA: Diagnosis not present

## 2019-09-06 DIAGNOSIS — C44329 Squamous cell carcinoma of skin of other parts of face: Secondary | ICD-10-CM | POA: Diagnosis not present

## 2019-09-06 DIAGNOSIS — L538 Other specified erythematous conditions: Secondary | ICD-10-CM | POA: Diagnosis not present

## 2019-09-06 DIAGNOSIS — C44519 Basal cell carcinoma of skin of other part of trunk: Secondary | ICD-10-CM | POA: Diagnosis not present

## 2019-09-06 DIAGNOSIS — D485 Neoplasm of uncertain behavior of skin: Secondary | ICD-10-CM | POA: Diagnosis not present

## 2019-09-06 DIAGNOSIS — L820 Inflamed seborrheic keratosis: Secondary | ICD-10-CM | POA: Diagnosis not present

## 2019-09-07 DIAGNOSIS — H401132 Primary open-angle glaucoma, bilateral, moderate stage: Secondary | ICD-10-CM | POA: Diagnosis not present

## 2019-09-10 MED ORDER — DORZOLAMIDE HCL-TIMOLOL MAL 2-0.5 % OP SOLN: 1.0000 [drp] | Freq: Two times a day (BID) | OPHTHALMIC | 11 refills | Status: DC

## 2019-09-12 ENCOUNTER — Other Ambulatory Visit: Payer: Self-pay | Admitting: Sports Medicine

## 2019-09-13 NOTE — Telephone Encounter (Signed)
It looks like this was just sent?

## 2019-10-27 ENCOUNTER — Other Ambulatory Visit: Payer: Self-pay | Admitting: Sports Medicine

## 2019-10-31 DIAGNOSIS — L905 Scar conditions and fibrosis of skin: Secondary | ICD-10-CM | POA: Diagnosis not present

## 2019-10-31 DIAGNOSIS — C44329 Squamous cell carcinoma of skin of other parts of face: Secondary | ICD-10-CM | POA: Diagnosis not present

## 2019-11-03 DIAGNOSIS — R918 Other nonspecific abnormal finding of lung field: Secondary | ICD-10-CM | POA: Diagnosis not present

## 2019-11-03 DIAGNOSIS — R911 Solitary pulmonary nodule: Secondary | ICD-10-CM | POA: Diagnosis not present

## 2019-11-09 DIAGNOSIS — J479 Bronchiectasis, uncomplicated: Secondary | ICD-10-CM | POA: Diagnosis not present

## 2019-11-09 DIAGNOSIS — R911 Solitary pulmonary nodule: Secondary | ICD-10-CM | POA: Diagnosis not present

## 2019-11-09 DIAGNOSIS — J4541 Moderate persistent asthma with (acute) exacerbation: Secondary | ICD-10-CM | POA: Diagnosis not present

## 2019-11-09 DIAGNOSIS — R0602 Shortness of breath: Secondary | ICD-10-CM | POA: Diagnosis not present

## 2019-12-05 ENCOUNTER — Encounter: Payer: Self-pay | Admitting: Sports Medicine

## 2019-12-05 ENCOUNTER — Ambulatory Visit (INDEPENDENT_AMBULATORY_CARE_PROVIDER_SITE_OTHER): Payer: Medicare Other | Admitting: Sports Medicine

## 2019-12-05 DIAGNOSIS — I10 Essential (primary) hypertension: Secondary | ICD-10-CM | POA: Diagnosis not present

## 2019-12-05 NOTE — Progress Notes (Signed)
° ° °  Procedures performed today:    None.  Independent interpretation of notes and tests performed by another provider:   None.  Brief History, Exam, Impression, and Recommendations:    Essential hypertension, benign This is a pleasant 86 year old male with historically labile blood pressures, over the weekend increased to over 200, he never had any symptoms of endorgan damage. Today it is 163/89, continues to be asymptomatic. Ultimate goal is not less than 140/90. Checking CBC, CMP, TSH today. He is taking his lisinopril and HCTZ but has not been taking his amlodipine. I am going to go and pull the trigger for a sleep study considering his labile blood pressures, and we will keep an eye on them before restarting amlodipine possibly at 5 mg. Follow-up with me in 1 month.    ___________________________________________ Gwen Her. Dianah Field, M.D., ABFM., CAQSM. Primary Care and [place] Instructor of [place] of [facility] of Medicine

## 2019-12-05 NOTE — Assessment & Plan Note (Signed)
This is a pleasant 86 year old male with historically labile blood pressures, over the weekend increased to over 200, he never had any symptoms of endorgan damage. Today it is 163/89, continues to be asymptomatic. Ultimate goal is not less than 140/90. Checking CBC, CMP, TSH today. He is taking his lisinopril and HCTZ but has not been taking his amlodipine. I am going to go and pull the trigger for a sleep study considering his labile blood pressures, and we will keep an eye on them before restarting amlodipine possibly at 5 mg. Follow-up with me in 1 month.

## 2019-12-06 LAB — COMPLETE METABOLIC PANEL WITH GFR
AG Ratio: 1.3 (calc) (ref 1.0–2.5)
ALT: 14 U/L (ref 9–46)
AST: 18 U/L (ref 10–35)
Albumin: 3.8 g/dL (ref 3.6–5.1)
Alkaline phosphatase (APISO): 60 U/L (ref 35–144)
BUN/Creatinine Ratio: 15 (calc) (ref 6–22)
BUN: 20 mg/dL (ref 7–25)
CO2: 29 mmol/L (ref 20–32)
Calcium: 9 mg/dL (ref 8.6–10.3)
Chloride: 97 mmol/L — ABNORMAL LOW (ref 98–110)
Creat: 1.37 mg/dL — ABNORMAL HIGH (ref 0.70–1.11)
GFR, Est African American: 55 mL/min/{1.73_m2} — ABNORMAL LOW (ref >=60)
GFR, Est Non African American: 47 mL/min/{1.73_m2} — ABNORMAL LOW (ref >=60)
Globulin: 2.9 g/dL (calc) (ref 1.9–3.7)
Glucose, Bld: 94 mg/dL (ref 65–99)
Potassium: 4.1 mmol/L (ref 3.5–5.3)
Sodium: 133 mmol/L — ABNORMAL LOW (ref 135–146)
Total Bilirubin: 0.5 mg/dL (ref 0.2–1.2)
Total Protein: 6.7 g/dL (ref 6.1–8.1)

## 2019-12-06 LAB — CBC
HCT: 36.6 % — ABNORMAL LOW (ref 38.5–50.0)
Hemoglobin: 12.3 g/dL — ABNORMAL LOW (ref 13.2–17.1)
MCH: 29.5 pg (ref 27.0–33.0)
MCHC: 33.6 g/dL (ref 32.0–36.0)
MCV: 87.8 fL (ref 80.0–100.0)
MPV: 9.2 fL (ref 7.5–12.5)
Platelets: 304 10*3/uL (ref 140–400)
RBC: 4.17 10*6/uL — ABNORMAL LOW (ref 4.20–5.80)
RDW: 13.1 % (ref 11.0–15.0)
WBC: 6.8 10*3/uL (ref 3.8–10.8)

## 2019-12-06 LAB — TSH: TSH: 3.53 mIU/L (ref 0.40–4.50)

## 2019-12-08 DIAGNOSIS — D485 Neoplasm of uncertain behavior of skin: Secondary | ICD-10-CM | POA: Diagnosis not present

## 2019-12-08 DIAGNOSIS — D225 Melanocytic nevi of trunk: Secondary | ICD-10-CM | POA: Diagnosis not present

## 2019-12-08 DIAGNOSIS — Z85828 Personal history of other malignant neoplasm of skin: Secondary | ICD-10-CM | POA: Diagnosis not present

## 2019-12-08 DIAGNOSIS — L570 Actinic keratosis: Secondary | ICD-10-CM | POA: Diagnosis not present

## 2019-12-08 DIAGNOSIS — Z08 Encounter for follow-up examination after completed treatment for malignant neoplasm: Secondary | ICD-10-CM | POA: Diagnosis not present

## 2019-12-08 DIAGNOSIS — L821 Other seborrheic keratosis: Secondary | ICD-10-CM | POA: Diagnosis not present

## 2019-12-29 DIAGNOSIS — M5416 Radiculopathy, lumbar region: Secondary | ICD-10-CM | POA: Diagnosis not present

## 2019-12-29 DIAGNOSIS — M9904 Segmental and somatic dysfunction of sacral region: Secondary | ICD-10-CM | POA: Diagnosis not present

## 2019-12-30 DIAGNOSIS — L570 Actinic keratosis: Secondary | ICD-10-CM | POA: Diagnosis not present

## 2019-12-30 DIAGNOSIS — L0101 Non-bullous impetigo: Secondary | ICD-10-CM | POA: Diagnosis not present

## 2019-12-30 DIAGNOSIS — G4730 Sleep apnea, unspecified: Secondary | ICD-10-CM | POA: Diagnosis not present

## 2020-01-02 ENCOUNTER — Ambulatory Visit: Payer: Medicare Other | Admitting: Sports Medicine

## 2020-01-02 DIAGNOSIS — M5416 Radiculopathy, lumbar region: Secondary | ICD-10-CM | POA: Diagnosis not present

## 2020-01-02 DIAGNOSIS — M9904 Segmental and somatic dysfunction of sacral region: Secondary | ICD-10-CM | POA: Diagnosis not present

## 2020-01-04 DIAGNOSIS — H401132 Primary open-angle glaucoma, bilateral, moderate stage: Secondary | ICD-10-CM | POA: Diagnosis not present

## 2020-01-05 DIAGNOSIS — M9904 Segmental and somatic dysfunction of sacral region: Secondary | ICD-10-CM | POA: Diagnosis not present

## 2020-01-05 DIAGNOSIS — M5416 Radiculopathy, lumbar region: Secondary | ICD-10-CM | POA: Diagnosis not present

## 2020-01-11 DIAGNOSIS — R911 Solitary pulmonary nodule: Secondary | ICD-10-CM | POA: Diagnosis not present

## 2020-01-11 DIAGNOSIS — J4541 Moderate persistent asthma with (acute) exacerbation: Secondary | ICD-10-CM | POA: Diagnosis not present

## 2020-01-11 DIAGNOSIS — C182 Malignant neoplasm of ascending colon: Secondary | ICD-10-CM | POA: Diagnosis not present

## 2020-01-11 DIAGNOSIS — J479 Bronchiectasis, uncomplicated: Secondary | ICD-10-CM | POA: Diagnosis not present

## 2020-01-13 ENCOUNTER — Encounter: Payer: Self-pay | Admitting: Sports Medicine

## 2020-01-13 ENCOUNTER — Other Ambulatory Visit: Payer: Self-pay

## 2020-01-13 ENCOUNTER — Ambulatory Visit (INDEPENDENT_AMBULATORY_CARE_PROVIDER_SITE_OTHER): Payer: Medicare Other | Admitting: Sports Medicine

## 2020-01-13 DIAGNOSIS — G4733 Obstructive sleep apnea (adult) (pediatric): Secondary | ICD-10-CM | POA: Diagnosis not present

## 2020-01-13 DIAGNOSIS — I10 Essential (primary) hypertension: Secondary | ICD-10-CM

## 2020-01-13 NOTE — Assessment & Plan Note (Signed)
Marcus Orr had his sleep study earlier this month, he it was positive for mild obstructive sleep apnea with 7 apneas, 95 hypopneas, and a low oxygen saturation that dropped to a nadir of 83%. His AHI was 7 and his RDI was 17.1. I am going to go and set him up for CPAP. I think this is a possible reason for his labile blood pressures.

## 2020-01-13 NOTE — Assessment & Plan Note (Signed)
Marcus Orr has started splitting his blood pressure medicine into a half tab twice a day rather than once daily and his blood pressures have become somewhat more stable, he is at about 140/70 today and I think this is adequate. We did recently diagnosed with sleep apnea so he will be using a CPAP machine, I hope we can peel off some of his medications.

## 2020-01-13 NOTE — Progress Notes (Signed)
    Procedures performed today:    None.  Independent interpretation of notes and tests performed by another provider:   None.  Brief History, Exam, Impression, and Recommendations:    Obstructive sleep apnea Schawn had his sleep study earlier this month, he it was positive for mild obstructive sleep apnea with 7 apneas, 95 hypopneas, and a low oxygen saturation that dropped to a nadir of 83%. His AHI was 7 and his RDI was 17.1. I am going to go and set him up for CPAP. I think this is a possible reason for his labile blood pressures.  Essential hypertension, benign Hogan has started splitting his blood pressure medicine into a half tab twice a day rather than once daily and his blood pressures have become somewhat more stable, he is at about 140/70 today and I think this is adequate. We did recently diagnosed with sleep apnea so he will be using a CPAP machine, I hope we can peel off some of his medications.    ___________________________________________ Gwen Her. Dianah Field, M.D., ABFM., CAQSM. Primary Care and [place] Instructor of [place] of [facility] of Medicine

## 2020-01-25 ENCOUNTER — Other Ambulatory Visit: Payer: Self-pay | Admitting: Sports Medicine

## 2020-02-01 ENCOUNTER — Other Ambulatory Visit: Payer: Self-pay | Admitting: Sports Medicine

## 2020-02-01 DIAGNOSIS — J4541 Moderate persistent asthma with (acute) exacerbation: Secondary | ICD-10-CM

## 2020-02-08 NOTE — Telephone Encounter (Signed)
Can you please look into this for patient?  Referral was put in on 01/13/2020 and patient hasn't heard anything yet.

## 2020-02-09 NOTE — Telephone Encounter (Signed)
I called Marcus Orr with [facility] and he stated he is calling now to have them take care of this ASAP - CF

## 2020-03-02 ENCOUNTER — Other Ambulatory Visit: Payer: Self-pay | Admitting: Sports Medicine

## 2020-03-02 DIAGNOSIS — I10 Essential (primary) hypertension: Secondary | ICD-10-CM

## 2020-03-05 ENCOUNTER — Other Ambulatory Visit: Payer: Self-pay

## 2020-03-05 DIAGNOSIS — I10 Essential (primary) hypertension: Secondary | ICD-10-CM

## 2020-03-05 MED ORDER — LISINOPRIL 40 MG PO TABS: 40.0000 mg | ORAL_TABLET | Freq: Every day | ORAL | 3 refills | Status: DC

## 2020-03-06 DIAGNOSIS — R911 Solitary pulmonary nodule: Secondary | ICD-10-CM | POA: Diagnosis not present

## 2020-03-06 DIAGNOSIS — R918 Other nonspecific abnormal finding of lung field: Secondary | ICD-10-CM | POA: Diagnosis not present

## 2020-03-06 DIAGNOSIS — J479 Bronchiectasis, uncomplicated: Secondary | ICD-10-CM | POA: Diagnosis not present

## 2020-03-06 DIAGNOSIS — J9811 Atelectasis: Secondary | ICD-10-CM | POA: Diagnosis not present

## 2020-03-19 DIAGNOSIS — J479 Bronchiectasis, uncomplicated: Secondary | ICD-10-CM | POA: Diagnosis not present

## 2020-03-19 DIAGNOSIS — J4540 Moderate persistent asthma, uncomplicated: Secondary | ICD-10-CM | POA: Diagnosis not present

## 2020-03-19 DIAGNOSIS — R911 Solitary pulmonary nodule: Secondary | ICD-10-CM | POA: Diagnosis not present

## 2020-03-19 DIAGNOSIS — J301 Allergic rhinitis due to pollen: Secondary | ICD-10-CM | POA: Diagnosis not present

## 2020-04-20 MED ORDER — TERAZOSIN HCL 5 MG PO CAPS
5.0000 mg | ORAL_CAPSULE | Freq: Every day | ORAL | 1 refills | Status: DC
Start: 2020-04-20 — End: 2020-11-02

## 2020-04-21 ENCOUNTER — Other Ambulatory Visit: Payer: Self-pay | Admitting: Sports Medicine

## 2020-04-26 ENCOUNTER — Other Ambulatory Visit: Payer: Self-pay | Admitting: Sports Medicine

## 2020-04-26 DIAGNOSIS — J4541 Moderate persistent asthma with (acute) exacerbation: Secondary | ICD-10-CM

## 2020-05-09 DIAGNOSIS — H401132 Primary open-angle glaucoma, bilateral, moderate stage: Secondary | ICD-10-CM | POA: Diagnosis not present

## 2020-05-28 DIAGNOSIS — R001 Bradycardia, unspecified: Secondary | ICD-10-CM | POA: Diagnosis not present

## 2020-05-28 DIAGNOSIS — I10 Essential (primary) hypertension: Secondary | ICD-10-CM | POA: Diagnosis not present

## 2020-05-28 DIAGNOSIS — Z79899 Other long term (current) drug therapy: Secondary | ICD-10-CM | POA: Diagnosis not present

## 2020-05-28 DIAGNOSIS — I350 Nonrheumatic aortic (valve) stenosis: Secondary | ICD-10-CM | POA: Diagnosis not present

## 2020-05-28 DIAGNOSIS — E785 Hyperlipidemia, unspecified: Secondary | ICD-10-CM | POA: Diagnosis not present

## 2020-06-07 DIAGNOSIS — S61511A Laceration without foreign body of right wrist, initial encounter: Secondary | ICD-10-CM | POA: Diagnosis not present

## 2020-06-07 DIAGNOSIS — Z23 Encounter for immunization: Secondary | ICD-10-CM | POA: Diagnosis not present

## 2020-06-14 DIAGNOSIS — L820 Inflamed seborrheic keratosis: Secondary | ICD-10-CM | POA: Diagnosis not present

## 2020-06-14 DIAGNOSIS — D0462 Carcinoma in situ of skin of left upper limb, including shoulder: Secondary | ICD-10-CM | POA: Diagnosis not present

## 2020-06-14 DIAGNOSIS — R238 Other skin changes: Secondary | ICD-10-CM | POA: Diagnosis not present

## 2020-06-14 DIAGNOSIS — L570 Actinic keratosis: Secondary | ICD-10-CM | POA: Diagnosis not present

## 2020-06-14 DIAGNOSIS — Z85828 Personal history of other malignant neoplasm of skin: Secondary | ICD-10-CM | POA: Diagnosis not present

## 2020-06-14 DIAGNOSIS — Z08 Encounter for follow-up examination after completed treatment for malignant neoplasm: Secondary | ICD-10-CM | POA: Diagnosis not present

## 2020-06-14 DIAGNOSIS — L578 Other skin changes due to chronic exposure to nonionizing radiation: Secondary | ICD-10-CM | POA: Diagnosis not present

## 2020-06-14 DIAGNOSIS — C44629 Squamous cell carcinoma of skin of left upper limb, including shoulder: Secondary | ICD-10-CM | POA: Diagnosis not present

## 2020-06-14 DIAGNOSIS — D239 Other benign neoplasm of skin, unspecified: Secondary | ICD-10-CM | POA: Diagnosis not present

## 2020-06-14 DIAGNOSIS — D485 Neoplasm of uncertain behavior of skin: Secondary | ICD-10-CM | POA: Diagnosis not present

## 2020-06-14 DIAGNOSIS — C4491 Basal cell carcinoma of skin, unspecified: Secondary | ICD-10-CM | POA: Diagnosis not present

## 2020-06-14 DIAGNOSIS — C4492 Squamous cell carcinoma of skin, unspecified: Secondary | ICD-10-CM | POA: Diagnosis not present

## 2020-06-29 DIAGNOSIS — Z23 Encounter for immunization: Secondary | ICD-10-CM | POA: Diagnosis not present

## 2020-07-11 ENCOUNTER — Other Ambulatory Visit: Payer: Self-pay | Admitting: Sports Medicine

## 2020-07-11 DIAGNOSIS — N401 Enlarged prostate with lower urinary tract symptoms: Secondary | ICD-10-CM

## 2020-07-18 ENCOUNTER — Other Ambulatory Visit: Payer: Self-pay | Admitting: Sports Medicine

## 2020-07-22 ENCOUNTER — Other Ambulatory Visit: Payer: Self-pay | Admitting: Sports Medicine

## 2020-07-31 DIAGNOSIS — D0462 Carcinoma in situ of skin of left upper limb, including shoulder: Secondary | ICD-10-CM | POA: Diagnosis not present

## 2020-07-31 DIAGNOSIS — L570 Actinic keratosis: Secondary | ICD-10-CM | POA: Diagnosis not present

## 2020-07-31 DIAGNOSIS — C44629 Squamous cell carcinoma of skin of left upper limb, including shoulder: Secondary | ICD-10-CM | POA: Diagnosis not present

## 2020-08-17 ENCOUNTER — Encounter: Payer: Self-pay | Admitting: Sports Medicine

## 2020-08-17 ENCOUNTER — Telehealth (INDEPENDENT_AMBULATORY_CARE_PROVIDER_SITE_OTHER): Payer: Medicare Other | Admitting: Sports Medicine

## 2020-08-17 DIAGNOSIS — J4541 Moderate persistent asthma with (acute) exacerbation: Secondary | ICD-10-CM

## 2020-08-17 MED ORDER — AZITHROMYCIN 250 MG PO TABS: ORAL_TABLET | ORAL | 0 refills | Status: DC

## 2020-08-17 MED ORDER — LORATADINE 10 MG PO TABS: 10.0000 mg | ORAL_TABLET | Freq: Every day | ORAL | 3 refills | Status: DC

## 2020-08-17 MED ORDER — PREDNISONE 50 MG PO TABS: 50.0000 mg | ORAL_TABLET | Freq: Every day | ORAL | 0 refills | Status: DC

## 2020-08-17 NOTE — Assessment & Plan Note (Signed)
This is a very pleasant 87 year old male, he has known asthmatic bronchitis, occasionally he will get exacerbations, certainly the pollen outside has not made things easy, he is using his albuterol, Symbicort, Claritin, Singulair all without sufficient improvement, he has noted that a runny nose and a cough has moved to his chest and he now has wheezing, shortness of breath and mild chest heaviness. He is speaking full sentences on the phone but this does represent an exacerbation of a chronic process so we will use 5 days of prednisone, azithromycin and have a 1 week follow-up virtually if he is doing better and in person if not.

## 2020-08-17 NOTE — Progress Notes (Signed)
   Virtual Visit via Telephone   I connected with  Marcus Brick Sr.  on 08/17/20 by telephone/telehealth and verified that I am speaking with the correct person using two identifiers.   I discussed the limitations, risks, security and privacy concerns of performing an evaluation and management service by telephone, including the higher likelihood of inaccurate diagnosis and treatment, and the availability of in person appointments.  We also discussed the likely need of an additional face to face encounter for complete and high quality delivery of care.  I also discussed with the patient that there may be a patient responsible charge related to this service. The patient expressed understanding and wishes to proceed.  Provider location is in medical facility. Patient location is at their home, different from provider location. People involved in care of the patient during this telehealth encounter were myself, my nurse/medical assistant, and my front office/scheduling team member.  Review of Systems: No fevers, chills, night sweats, weight loss, chest pain, or shortness of breath.   Objective Findings:    General: Speaking full sentences, no audible heavy breathing.  Sounds alert and appropriately interactive.    Independent interpretation of tests performed by another provider:   None.  Brief History, Exam, Impression, and Recommendations:    Asthmatic bronchitis This is a very pleasant 87 year old male, he has known asthmatic bronchitis, occasionally he will get exacerbations, certainly the pollen outside has not made things easy, he is using his albuterol, Symbicort, Claritin, Singulair all without sufficient improvement, he has noted that a runny nose and a cough has moved to his chest and he now has wheezing, shortness of breath and mild chest heaviness. He is speaking full sentences on the phone but this does represent an exacerbation of a chronic process so we will use 5 days of  prednisone, azithromycin and have a 1 week follow-up virtually if he is doing better and in person if not.   I discussed the above assessment and treatment plan with the patient. The patient was provided an opportunity to ask questions and all were answered. The patient agreed with the plan and demonstrated an understanding of the instructions.   The patient was advised to call back or seek an in-person evaluation if the symptoms worsen or if the condition fails to improve as anticipated.   I provided 30 minutes of verbal and non-verbal time during this encounter date, time was needed to gather information, review chart, records, communicate/coordinate with staff remotely, as well as complete documentation.   ___________________________________________ Gwen Her. Dianah Field, M.D., ABFM., CAQSM. Primary Care and Sports Medicine [place] [facility]  Adjunct Professor of [place] of [facility] of [employer]

## 2020-08-29 DIAGNOSIS — H04123 Dry eye syndrome of bilateral lacrimal glands: Secondary | ICD-10-CM | POA: Diagnosis not present

## 2020-08-29 DIAGNOSIS — H401132 Primary open-angle glaucoma, bilateral, moderate stage: Secondary | ICD-10-CM | POA: Diagnosis not present

## 2020-09-18 DIAGNOSIS — J4541 Moderate persistent asthma with (acute) exacerbation: Secondary | ICD-10-CM | POA: Diagnosis not present

## 2020-09-18 DIAGNOSIS — J301 Allergic rhinitis due to pollen: Secondary | ICD-10-CM | POA: Diagnosis not present

## 2020-09-18 DIAGNOSIS — J479 Bronchiectasis, uncomplicated: Secondary | ICD-10-CM | POA: Diagnosis not present

## 2020-09-18 DIAGNOSIS — R911 Solitary pulmonary nodule: Secondary | ICD-10-CM | POA: Diagnosis not present

## 2020-09-18 DIAGNOSIS — C182 Malignant neoplasm of ascending colon: Secondary | ICD-10-CM | POA: Diagnosis not present

## 2020-09-19 ENCOUNTER — Ambulatory Visit (INDEPENDENT_AMBULATORY_CARE_PROVIDER_SITE_OTHER): Payer: Medicare Other | Admitting: Sports Medicine

## 2020-09-19 ENCOUNTER — Encounter: Payer: Self-pay | Admitting: Sports Medicine

## 2020-09-19 ENCOUNTER — Ambulatory Visit (INDEPENDENT_AMBULATORY_CARE_PROVIDER_SITE_OTHER): Payer: Medicare Other

## 2020-09-19 ENCOUNTER — Other Ambulatory Visit: Payer: Self-pay

## 2020-09-19 VITALS — BP 190/81 | HR 68 | Ht 70.0 in | Wt 166.0 lb

## 2020-09-19 DIAGNOSIS — R918 Other nonspecific abnormal finding of lung field: Secondary | ICD-10-CM

## 2020-09-19 DIAGNOSIS — R911 Solitary pulmonary nodule: Secondary | ICD-10-CM

## 2020-09-19 DIAGNOSIS — R0602 Shortness of breath: Secondary | ICD-10-CM | POA: Diagnosis not present

## 2020-09-19 DIAGNOSIS — Z7689 Persons encountering health services in other specified circumstances: Secondary | ICD-10-CM | POA: Diagnosis not present

## 2020-09-19 DIAGNOSIS — J4541 Moderate persistent asthma with (acute) exacerbation: Secondary | ICD-10-CM

## 2020-09-19 MED ORDER — PREDNISONE 10 MG (48) PO TBPK: ORAL_TABLET | Freq: Every day | ORAL | 0 refills | Status: DC

## 2020-09-19 MED ORDER — DOXYCYCLINE HYCLATE 100 MG PO TABS: 100.0000 mg | ORAL_TABLET | Freq: Two times a day (BID) | ORAL | 0 refills | Status: AC

## 2020-09-19 MED ORDER — TRELEGY ELLIPTA 200-62.5-25 MCG/INH IN AEPB: INHALATION_SPRAY | RESPIRATORY_TRACT | 0 refills | Status: DC

## 2020-09-19 NOTE — Progress Notes (Addendum)
    Procedures performed today:    None.  Independent interpretation of notes and tests performed by another provider:   None.  Brief History, Exam, Impression, and Recommendations:    Asthmatic bronchitis with bronchiectasis Mazin is a pleasant 87 year old male with history of asthmatic bronchitis with bronchiectasis, I saw him about a month ago on a virtual visit, we did some azithromycin and prednisone and he improved shortly.  Yesterday he went to see his pulmonologist, still had some symptoms so they added some prednisone, this was sent to his mail order pharmacy so he has not gotten it yet.  He did a 6-minute walk test and did not desaturate. Chest x-ray per his report was clear although I do not have these results. On exam today he is speaking full sentences, no nasal flaring, no accessory muscle use, lungs are clear albeit poor air movement. I will go ahead and call in the prednisone, we will do a 12-day taper, doxycycline, I would like my own chest x-ray for my own personal review, I would also like him to switch from Symbicort to Trelegy for a month, I did give him two 14 day Trelegy samples today. Return to see me in a month to see how things are going.  This is a chronic process with exacerbation and pharmacologic intervention.  Update: Chest x-ray shows potential pneumonia, there is opacity however these do appear to be somewhat persistent from prior chest imaging and chest CT is recommended.  Pulmonary nodules Persistence of pulmonary nodules, repeat CT chest recommended based on chest x-ray.    ___________________________________________ Gwen Her. Dianah Field, M.D., ABFM., CAQSM. Primary Care and [place] Instructor of [place] of [facility] of Medicine

## 2020-09-19 NOTE — Assessment & Plan Note (Addendum)
Marcus Orr is a pleasant 87 year old male with history of asthmatic bronchitis with bronchiectasis, I saw him about a month ago on a virtual visit, we did some azithromycin and prednisone and he improved shortly.  Yesterday he went to see his pulmonologist, still had some symptoms so they added some prednisone, this was sent to his mail order pharmacy so he has not gotten it yet.  He did a 6-minute walk test and did not desaturate. Chest x-ray per his report was clear although I do not have these results. On exam today he is speaking full sentences, no nasal flaring, no accessory muscle use, lungs are clear albeit poor air movement. I will go ahead and call in the prednisone, we will do a 12-day taper, doxycycline, I would like my own chest x-ray for my own personal review, I would also like him to switch from Symbicort to Trelegy for a month, I did give him two 14 day Trelegy samples today. Return to see me in a month to see how things are going.  This is a chronic process with exacerbation and pharmacologic intervention.  Update: Chest x-ray shows potential pneumonia, there is opacity however these do appear to be somewhat persistent from prior chest imaging and chest CT is recommended.

## 2020-09-21 NOTE — Assessment & Plan Note (Signed)
Persistence of pulmonary nodules, repeat CT chest recommended based on chest x-ray.

## 2020-09-21 NOTE — Addendum Note (Signed)
Addended by: Silverio Decamp on: 09/21/2020 04:13 PM   Modules accepted: Orders

## 2020-09-24 ENCOUNTER — Other Ambulatory Visit: Payer: Self-pay

## 2020-09-24 ENCOUNTER — Encounter: Payer: Self-pay | Admitting: Family

## 2020-09-24 ENCOUNTER — Ambulatory Visit (INDEPENDENT_AMBULATORY_CARE_PROVIDER_SITE_OTHER): Payer: Medicare Other

## 2020-09-24 DIAGNOSIS — R918 Other nonspecific abnormal finding of lung field: Secondary | ICD-10-CM | POA: Diagnosis not present

## 2020-09-24 DIAGNOSIS — R911 Solitary pulmonary nodule: Secondary | ICD-10-CM | POA: Diagnosis not present

## 2020-09-24 DIAGNOSIS — I2510 Atherosclerotic heart disease of native coronary artery without angina pectoris: Secondary | ICD-10-CM | POA: Diagnosis not present

## 2020-09-24 DIAGNOSIS — J479 Bronchiectasis, uncomplicated: Secondary | ICD-10-CM | POA: Diagnosis not present

## 2020-09-24 DIAGNOSIS — I700 Atherosclerosis of aorta: Secondary | ICD-10-CM | POA: Diagnosis not present

## 2020-10-05 ENCOUNTER — Encounter (INDEPENDENT_AMBULATORY_CARE_PROVIDER_SITE_OTHER): Payer: Medicare Other

## 2020-10-05 DIAGNOSIS — J4541 Moderate persistent asthma with (acute) exacerbation: Secondary | ICD-10-CM

## 2020-10-05 DIAGNOSIS — J209 Acute bronchitis, unspecified: Secondary | ICD-10-CM

## 2020-10-05 MED ORDER — AZITHROMYCIN 250 MG PO TABS: ORAL_TABLET | ORAL | 0 refills | Status: AC

## 2020-10-05 MED ORDER — PREDNISONE 10 MG (48) PO TBPK: ORAL_TABLET | Freq: Every day | ORAL | 0 refills | Status: DC

## 2020-10-05 MED ORDER — ALBUTEROL SULFATE HFA 108 (90 BASE) MCG/ACT IN AERS: 2.0000 | INHALATION_SPRAY | Freq: Four times a day (QID) | RESPIRATORY_TRACT | 3 refills | Status: DC | PRN

## 2020-10-05 MED ORDER — TRELEGY ELLIPTA 200-62.5-25 MCG/INH IN AEPB: INHALATION_SPRAY | RESPIRATORY_TRACT | 11 refills | Status: DC

## 2020-10-05 NOTE — Telephone Encounter (Signed)
I spent 5 total minutes of online digital evaluation and management services. 

## 2020-10-19 ENCOUNTER — Encounter: Payer: Self-pay | Admitting: Sports Medicine

## 2020-10-19 ENCOUNTER — Ambulatory Visit (INDEPENDENT_AMBULATORY_CARE_PROVIDER_SITE_OTHER): Payer: Medicare Other | Admitting: Sports Medicine

## 2020-10-19 ENCOUNTER — Other Ambulatory Visit: Payer: Self-pay

## 2020-10-19 ENCOUNTER — Ambulatory Visit (INDEPENDENT_AMBULATORY_CARE_PROVIDER_SITE_OTHER): Payer: Medicare Other

## 2020-10-19 DIAGNOSIS — R9389 Abnormal findings on diagnostic imaging of other specified body structures: Secondary | ICD-10-CM

## 2020-10-19 DIAGNOSIS — J4541 Moderate persistent asthma with (acute) exacerbation: Secondary | ICD-10-CM | POA: Diagnosis not present

## 2020-10-19 DIAGNOSIS — R918 Other nonspecific abnormal finding of lung field: Secondary | ICD-10-CM | POA: Diagnosis not present

## 2020-10-19 DIAGNOSIS — Z85038 Personal history of other malignant neoplasm of large intestine: Secondary | ICD-10-CM

## 2020-10-19 DIAGNOSIS — J42 Unspecified chronic bronchitis: Secondary | ICD-10-CM | POA: Diagnosis not present

## 2020-10-19 NOTE — Assessment & Plan Note (Signed)
Marcus Orr is a pleasant 87 year old male, he has a history of asthmatic bronchitis with bronchiectasis, he has had a few episodes of exacerbations, we did a course of azithromycin and prednisone approximately 2 months ago, he improved to some degree, ultimately saw his pulmonologist after return of symptoms, they added additional prednisone, unfortunately they sent it to his mail order pharmacy so he had not obtained it. A 6-minute walk test at that time did not show any desaturations. Also at that time a chest x-ray was reportedly clear per his report. At the last visit when I saw him he was not in respiratory distress, but he had very poor air movement, we obtained this chest x-ray that did not show evidence of pneumonia. He also complained of some viral symptoms at the time, he had seen his daughter several weeks prior who was subsequently diagnosed with COVID. We added a 12-day taper of prednisone, doxycycline, we obtained a new chest x-ray and switched him from Symbicort to Trelegy. Halfway along he reported improvements with Trelegy. He then discontinued his Trelegy and noted a worsening of symptoms. He has not yet restarted it. I have encouraged him to restart his Trelegy, we will give him another sample today, I think this will need to be a daily medication for him, I would like updated chest x-ray, if insufficient improvement in a month or so he would need to follow-up with his pulmonologist, and likely would need to do prednisone at least 5 mg daily.

## 2020-10-19 NOTE — Progress Notes (Signed)
    Procedures performed today:    None.  Independent interpretation of notes and tests performed by another provider:   None.  Brief History, Exam, Impression, and Recommendations:    Asthmatic bronchitis with bronchiectasis Marcus Orr is a pleasant 87 year old male, he has a history of asthmatic bronchitis with bronchiectasis, he has had a few episodes of exacerbations, we did a course of azithromycin and prednisone approximately 2 months ago, he improved to some degree, ultimately saw his pulmonologist after return of symptoms, they added additional prednisone, unfortunately they sent it to his mail order pharmacy so he had not obtained it. A 6-minute walk test at that time did not show any desaturations. Also at that time a chest x-ray was reportedly clear per his report. At the last visit when I saw him he was not in respiratory distress, but he had very poor air movement, we obtained this chest x-ray that did not show evidence of pneumonia. He also complained of some viral symptoms at the time, he had seen his daughter several weeks prior who was subsequently diagnosed with COVID. We added a 12-day taper of prednisone, doxycycline, we obtained a new chest x-ray and switched him from Symbicort to Trelegy. Halfway along he reported improvements with Trelegy. He then discontinued his Trelegy and noted a worsening of symptoms. He has not yet restarted it. I have encouraged him to restart his Trelegy, we will give him another sample today, I think this will need to be a daily medication for him, I would like updated chest x-ray, if insufficient improvement in a month or so he would need to follow-up with his pulmonologist, and likely would need to do prednisone at least 5 mg daily.    ___________________________________________ Marcus Orr. Dianah Field, M.D., ABFM., CAQSM. Primary Care and [place] Instructor of [place]  of [facility] of Medicine

## 2020-10-21 DIAGNOSIS — Z20822 Contact with and (suspected) exposure to covid-19: Secondary | ICD-10-CM | POA: Diagnosis not present

## 2020-10-22 LAB — CBC WITH DIFFERENTIAL/PLATELET
Absolute Monocytes: 927 cells/uL (ref 200–950)
Basophils Absolute: 41 cells/uL (ref 0–200)
Basophils Relative: 0.5 %
Eosinophils Absolute: 451 cells/uL (ref 15–500)
Eosinophils Relative: 5.5 %
HCT: 34.5 % — ABNORMAL LOW (ref 38.5–50.0)
Hemoglobin: 11.6 g/dL — ABNORMAL LOW (ref 13.2–17.1)
Lymphs Abs: 902 cells/uL (ref 850–3900)
MCH: 29.7 pg (ref 27.0–33.0)
MCHC: 33.6 g/dL (ref 32.0–36.0)
MCV: 88.2 fL (ref 80.0–100.0)
MPV: 9.3 fL (ref 7.5–12.5)
Monocytes Relative: 11.3 %
Neutro Abs: 5879 cells/uL (ref 1500–7800)
Neutrophils Relative %: 71.7 %
Platelets: 348 10*3/uL (ref 140–400)
RBC: 3.91 10*6/uL — ABNORMAL LOW (ref 4.20–5.80)
RDW: 13.9 % (ref 11.0–15.0)
Total Lymphocyte: 11 %
WBC: 8.2 10*3/uL (ref 3.8–10.8)

## 2020-10-22 LAB — COMPREHENSIVE METABOLIC PANEL
AG Ratio: 1.5 (calc) (ref 1.0–2.5)
ALT: 18 U/L (ref 9–46)
AST: 18 U/L (ref 10–35)
Albumin: 3.3 g/dL — ABNORMAL LOW (ref 3.6–5.1)
Alkaline phosphatase (APISO): 49 U/L (ref 35–144)
BUN/Creatinine Ratio: 17 (calc) (ref 6–22)
BUN: 23 mg/dL (ref 7–25)
CO2: 30 mmol/L (ref 20–32)
Calcium: 8.6 mg/dL (ref 8.6–10.3)
Chloride: 97 mmol/L — ABNORMAL LOW (ref 98–110)
Creat: 1.32 mg/dL — ABNORMAL HIGH (ref 0.70–1.11)
Globulin: 2.2 g/dL (calc) (ref 1.9–3.7)
Glucose, Bld: 83 mg/dL (ref 65–99)
Potassium: 4.3 mmol/L (ref 3.5–5.3)
Sodium: 134 mmol/L — ABNORMAL LOW (ref 135–146)
Total Bilirubin: 0.5 mg/dL (ref 0.2–1.2)
Total Protein: 5.5 g/dL — ABNORMAL LOW (ref 6.1–8.1)

## 2020-10-22 LAB — ALPHA-1-ANTITRYPSIN: A-1 Antitrypsin, Ser: 150 mg/dL (ref 83–199)

## 2020-10-22 LAB — QUANTIFERON-TB GOLD PLUS
Mitogen-NIL: 1.4 IU/mL
NIL: 0.05 IU/mL
QuantiFERON-TB Gold Plus: NEGATIVE
TB1-NIL: 0 IU/mL
TB2-NIL: 0.02 IU/mL

## 2020-10-22 LAB — GLOMERULAR BASEMENT MEMBRANE ANTIBODIES: GBM Ab: <1 AI

## 2020-10-22 LAB — SARS COV-2 SEROLOGY(COVID-19)AB(IGG,IGM),IMMUNOASSAY
SARS CoV-2 AB IgG: NEGATIVE
SARS CoV-2 IgM: NEGATIVE

## 2020-10-22 LAB — ANGIOTENSIN CONVERTING ENZYME: Angiotensin-Converting Enzyme: <5 U/L — ABNORMAL LOW (ref 9–67)

## 2020-10-24 ENCOUNTER — Telehealth: Payer: Self-pay

## 2020-10-24 DIAGNOSIS — J209 Acute bronchitis, unspecified: Secondary | ICD-10-CM

## 2020-10-24 DIAGNOSIS — J4541 Moderate persistent asthma with (acute) exacerbation: Secondary | ICD-10-CM

## 2020-10-24 NOTE — Telephone Encounter (Signed)
Fax received from pharmacy indicating a PA was needed for Albuterol inhaler. Using Covermymeds, a PA was completed and submitted; awaiting response.

## 2020-10-28 DIAGNOSIS — J4541 Moderate persistent asthma with (acute) exacerbation: Secondary | ICD-10-CM

## 2020-10-29 MED ORDER — TRELEGY ELLIPTA 200-62.5-25 MCG/INH IN AEPB: INHALATION_SPRAY | RESPIRATORY_TRACT | 3 refills | Status: DC

## 2020-10-29 MED ORDER — LEVOTHYROXINE SODIUM 75 MCG PO TABS: 75.0000 ug | ORAL_TABLET | Freq: Every day | ORAL | 3 refills | Status: DC

## 2020-10-29 MED ORDER — ALBUTEROL SULFATE HFA 108 (90 BASE) MCG/ACT IN AERS: 2.0000 | INHALATION_SPRAY | Freq: Four times a day (QID) | RESPIRATORY_TRACT | 11 refills | Status: AC | PRN

## 2020-10-29 NOTE — Telephone Encounter (Signed)
Fax received from [facility] with a denial on patient's albuterol inhaler. Information put in PCP inbox for further direction.

## 2020-10-29 NOTE — Telephone Encounter (Signed)
Looks like its $20 at [facility] for the big inhaler, I am going to send to the [facility] in [place].

## 2020-10-30 ENCOUNTER — Other Ambulatory Visit: Payer: Self-pay | Admitting: Sports Medicine

## 2020-10-30 DIAGNOSIS — J4541 Moderate persistent asthma with (acute) exacerbation: Secondary | ICD-10-CM

## 2020-10-30 NOTE — Telephone Encounter (Signed)
Patient aware which pharmacy prescription sent to and to tell them to use GoodRx coupon.

## 2020-10-30 NOTE — Telephone Encounter (Signed)
Is this cash price or using Good Rx?

## 2020-10-30 NOTE — Telephone Encounter (Signed)
GoodRx, sent him a message telling him to ask them to apply the coupon.

## 2020-11-02 ENCOUNTER — Other Ambulatory Visit: Payer: Self-pay | Admitting: Sports Medicine

## 2020-11-07 ENCOUNTER — Other Ambulatory Visit: Payer: Self-pay | Admitting: Sports Medicine

## 2020-11-07 DIAGNOSIS — R059 Cough, unspecified: Secondary | ICD-10-CM

## 2020-11-13 DIAGNOSIS — J309 Allergic rhinitis, unspecified: Secondary | ICD-10-CM | POA: Diagnosis not present

## 2020-11-13 DIAGNOSIS — J4530 Mild persistent asthma, uncomplicated: Secondary | ICD-10-CM | POA: Diagnosis not present

## 2020-11-13 DIAGNOSIS — J479 Bronchiectasis, uncomplicated: Secondary | ICD-10-CM | POA: Diagnosis not present

## 2020-11-13 DIAGNOSIS — R911 Solitary pulmonary nodule: Secondary | ICD-10-CM | POA: Diagnosis not present

## 2020-11-14 ENCOUNTER — Ambulatory Visit: Payer: Medicare Other | Admitting: Sports Medicine

## 2020-11-23 ENCOUNTER — Encounter: Payer: Self-pay | Admitting: Sports Medicine

## 2020-11-23 ENCOUNTER — Ambulatory Visit (INDEPENDENT_AMBULATORY_CARE_PROVIDER_SITE_OTHER): Payer: Medicare Other | Admitting: Sports Medicine

## 2020-11-23 DIAGNOSIS — J4541 Moderate persistent asthma with (acute) exacerbation: Secondary | ICD-10-CM

## 2020-11-23 DIAGNOSIS — I10 Essential (primary) hypertension: Secondary | ICD-10-CM | POA: Diagnosis not present

## 2020-11-23 DIAGNOSIS — Z0000 Encounter for general adult medical examination without abnormal findings: Secondary | ICD-10-CM | POA: Diagnosis not present

## 2020-11-23 MED ORDER — SHINGRIX 50 MCG/0.5ML IM SUSR: 0.5000 mL | Freq: Once | INTRAMUSCULAR | 0 refills | Status: AC

## 2020-11-23 NOTE — Assessment & Plan Note (Addendum)
Blood pressure is elevated, he did just take his medication and tells me it takes approximately 4 hours to kick in. I would like him to let me know what his blood pressure looks like this evening on a MyChart message.  Update: Blood pressure improved to 145/83 later in the day, no change in plan.

## 2020-11-23 NOTE — Assessment & Plan Note (Signed)
Marcus Orr returns, he is a pleasant 87 year old male, history of asthmatic bronchitis, bronchiectasis on CT scans, long story short we got him back on a higher dose Trelegy and symptoms improved considerably.  He is out walking and active, which he was unable to do before. He does have some follow-ups with his pulmonologist, he can return to see me on an as-needed basis for this.

## 2020-11-23 NOTE — Progress Notes (Addendum)
    Procedures performed today:    None.  Independent interpretation of notes and tests performed by another provider:   None.  Brief History, Exam, Impression, and Recommendations:    Asthmatic bronchitis with bronchiectasis Marcus Orr returns, he is a pleasant 87 year old male, history of asthmatic bronchitis, bronchiectasis on CT scans, long story short we got him back on a higher dose Trelegy and symptoms improved considerably.  He is out walking and active, which he was unable to do before. He does have some follow-ups with his pulmonologist, he can return to see me on an as-needed basis for this.  Annual physical exam Sending off Shingrix vaccination. He did have his tetanus vaccination in February.  Essential hypertension, benign Blood pressure is elevated, he did just take his medication and tells me it takes approximately 4 hours to kick in. I would like him to let me know what his blood pressure looks like this evening on a MyChart message.  Update: Blood pressure improved to 145/83 later in the day, no change in plan.    ___________________________________________ Gwen Her. Dianah Field, M.D., ABFM., CAQSM. Primary Care and [place] Instructor of [place] of [facility] of Medicine

## 2020-11-23 NOTE — Assessment & Plan Note (Signed)
Sending off Shingrix vaccination. He did have his tetanus vaccination in February.

## 2020-11-28 DIAGNOSIS — Z85038 Personal history of other malignant neoplasm of large intestine: Secondary | ICD-10-CM | POA: Diagnosis not present

## 2020-11-28 DIAGNOSIS — J45909 Unspecified asthma, uncomplicated: Secondary | ICD-10-CM | POA: Diagnosis not present

## 2020-11-28 DIAGNOSIS — K635 Polyp of colon: Secondary | ICD-10-CM | POA: Diagnosis not present

## 2020-11-28 DIAGNOSIS — K5730 Diverticulosis of large intestine without perforation or abscess without bleeding: Secondary | ICD-10-CM | POA: Diagnosis not present

## 2020-11-28 DIAGNOSIS — K648 Other hemorrhoids: Secondary | ICD-10-CM | POA: Diagnosis not present

## 2020-11-28 DIAGNOSIS — D126 Benign neoplasm of colon, unspecified: Secondary | ICD-10-CM | POA: Diagnosis not present

## 2020-11-28 DIAGNOSIS — Z1211 Encounter for screening for malignant neoplasm of colon: Secondary | ICD-10-CM | POA: Diagnosis not present

## 2020-11-28 DIAGNOSIS — Z86010 Personal history of colonic polyps: Secondary | ICD-10-CM | POA: Diagnosis not present

## 2020-11-28 LAB — HM COLONOSCOPY

## 2020-12-12 DIAGNOSIS — R9389 Abnormal findings on diagnostic imaging of other specified body structures: Secondary | ICD-10-CM | POA: Diagnosis not present

## 2020-12-12 DIAGNOSIS — J4530 Mild persistent asthma, uncomplicated: Secondary | ICD-10-CM | POA: Diagnosis not present

## 2020-12-12 DIAGNOSIS — J301 Allergic rhinitis due to pollen: Secondary | ICD-10-CM | POA: Diagnosis not present

## 2020-12-12 DIAGNOSIS — R911 Solitary pulmonary nodule: Secondary | ICD-10-CM | POA: Diagnosis not present

## 2020-12-12 DIAGNOSIS — J479 Bronchiectasis, uncomplicated: Secondary | ICD-10-CM | POA: Diagnosis not present

## 2021-01-15 DIAGNOSIS — L820 Inflamed seborrheic keratosis: Secondary | ICD-10-CM | POA: Diagnosis not present

## 2021-01-15 DIAGNOSIS — L821 Other seborrheic keratosis: Secondary | ICD-10-CM | POA: Diagnosis not present

## 2021-01-15 DIAGNOSIS — L814 Other melanin hyperpigmentation: Secondary | ICD-10-CM | POA: Diagnosis not present

## 2021-01-15 DIAGNOSIS — C4401 Basal cell carcinoma of skin of lip: Secondary | ICD-10-CM | POA: Diagnosis not present

## 2021-01-15 DIAGNOSIS — D045 Carcinoma in situ of skin of trunk: Secondary | ICD-10-CM | POA: Diagnosis not present

## 2021-01-15 DIAGNOSIS — D0462 Carcinoma in situ of skin of left upper limb, including shoulder: Secondary | ICD-10-CM | POA: Diagnosis not present

## 2021-01-15 DIAGNOSIS — D485 Neoplasm of uncertain behavior of skin: Secondary | ICD-10-CM | POA: Diagnosis not present

## 2021-01-15 DIAGNOSIS — Z85828 Personal history of other malignant neoplasm of skin: Secondary | ICD-10-CM | POA: Diagnosis not present

## 2021-01-15 DIAGNOSIS — D235 Other benign neoplasm of skin of trunk: Secondary | ICD-10-CM | POA: Diagnosis not present

## 2021-01-15 DIAGNOSIS — Z08 Encounter for follow-up examination after completed treatment for malignant neoplasm: Secondary | ICD-10-CM | POA: Diagnosis not present

## 2021-02-05 DIAGNOSIS — D0462 Carcinoma in situ of skin of left upper limb, including shoulder: Secondary | ICD-10-CM | POA: Diagnosis not present

## 2021-02-05 DIAGNOSIS — D045 Carcinoma in situ of skin of trunk: Secondary | ICD-10-CM | POA: Diagnosis not present

## 2021-02-14 DIAGNOSIS — C4401 Basal cell carcinoma of skin of lip: Secondary | ICD-10-CM | POA: Diagnosis not present

## 2021-02-15 DIAGNOSIS — Z23 Encounter for immunization: Secondary | ICD-10-CM | POA: Diagnosis not present

## 2021-02-21 DIAGNOSIS — Z4802 Encounter for removal of sutures: Secondary | ICD-10-CM | POA: Diagnosis not present

## 2021-02-22 DIAGNOSIS — Z23 Encounter for immunization: Secondary | ICD-10-CM | POA: Diagnosis not present

## 2021-03-01 DIAGNOSIS — H401132 Primary open-angle glaucoma, bilateral, moderate stage: Secondary | ICD-10-CM | POA: Diagnosis not present

## 2021-03-02 DIAGNOSIS — R479 Unspecified speech disturbances: Secondary | ICD-10-CM | POA: Diagnosis not present

## 2021-03-02 DIAGNOSIS — Z8673 Personal history of transient ischemic attack (TIA), and cerebral infarction without residual deficits: Secondary | ICD-10-CM | POA: Diagnosis not present

## 2021-03-02 DIAGNOSIS — I6782 Cerebral ischemia: Secondary | ICD-10-CM | POA: Diagnosis not present

## 2021-03-02 DIAGNOSIS — I639 Cerebral infarction, unspecified: Secondary | ICD-10-CM | POA: Diagnosis not present

## 2021-03-02 DIAGNOSIS — G9389 Other specified disorders of brain: Secondary | ICD-10-CM | POA: Diagnosis not present

## 2021-03-02 DIAGNOSIS — R911 Solitary pulmonary nodule: Secondary | ICD-10-CM | POA: Diagnosis not present

## 2021-03-02 DIAGNOSIS — R531 Weakness: Secondary | ICD-10-CM | POA: Diagnosis not present

## 2021-03-02 DIAGNOSIS — R9082 White matter disease, unspecified: Secondary | ICD-10-CM | POA: Diagnosis not present

## 2021-03-02 DIAGNOSIS — R410 Disorientation, unspecified: Secondary | ICD-10-CM | POA: Diagnosis not present

## 2021-03-02 DIAGNOSIS — E039 Hypothyroidism, unspecified: Secondary | ICD-10-CM | POA: Diagnosis not present

## 2021-03-02 DIAGNOSIS — E785 Hyperlipidemia, unspecified: Secondary | ICD-10-CM | POA: Diagnosis not present

## 2021-03-02 DIAGNOSIS — Z79899 Other long term (current) drug therapy: Secondary | ICD-10-CM | POA: Diagnosis not present

## 2021-03-02 DIAGNOSIS — R2689 Other abnormalities of gait and mobility: Secondary | ICD-10-CM | POA: Diagnosis not present

## 2021-03-02 DIAGNOSIS — R29818 Other symptoms and signs involving the nervous system: Secondary | ICD-10-CM | POA: Diagnosis not present

## 2021-03-02 DIAGNOSIS — N400 Enlarged prostate without lower urinary tract symptoms: Secondary | ICD-10-CM | POA: Diagnosis not present

## 2021-03-02 DIAGNOSIS — I10 Essential (primary) hypertension: Secondary | ICD-10-CM | POA: Diagnosis not present

## 2021-03-02 DIAGNOSIS — R42 Dizziness and giddiness: Secondary | ICD-10-CM | POA: Diagnosis not present

## 2021-03-02 DIAGNOSIS — G459 Transient cerebral ischemic attack, unspecified: Secondary | ICD-10-CM | POA: Diagnosis not present

## 2021-03-02 DIAGNOSIS — Z79890 Hormone replacement therapy (postmenopausal): Secondary | ICD-10-CM | POA: Diagnosis not present

## 2021-03-03 DIAGNOSIS — I088 Other rheumatic multiple valve diseases: Secondary | ICD-10-CM | POA: Diagnosis not present

## 2021-03-03 DIAGNOSIS — G459 Transient cerebral ischemic attack, unspecified: Secondary | ICD-10-CM | POA: Diagnosis not present

## 2021-03-03 DIAGNOSIS — G9389 Other specified disorders of brain: Secondary | ICD-10-CM | POA: Diagnosis not present

## 2021-03-03 DIAGNOSIS — I6782 Cerebral ischemia: Secondary | ICD-10-CM | POA: Diagnosis not present

## 2021-03-03 DIAGNOSIS — I517 Cardiomegaly: Secondary | ICD-10-CM | POA: Diagnosis not present

## 2021-03-03 DIAGNOSIS — E039 Hypothyroidism, unspecified: Secondary | ICD-10-CM | POA: Diagnosis not present

## 2021-03-03 DIAGNOSIS — R29818 Other symptoms and signs involving the nervous system: Secondary | ICD-10-CM | POA: Diagnosis not present

## 2021-03-03 DIAGNOSIS — I10 Essential (primary) hypertension: Secondary | ICD-10-CM | POA: Diagnosis not present

## 2021-03-04 DIAGNOSIS — I10 Essential (primary) hypertension: Secondary | ICD-10-CM | POA: Diagnosis not present

## 2021-03-04 DIAGNOSIS — E785 Hyperlipidemia, unspecified: Secondary | ICD-10-CM | POA: Diagnosis not present

## 2021-03-04 DIAGNOSIS — R918 Other nonspecific abnormal finding of lung field: Secondary | ICD-10-CM | POA: Diagnosis not present

## 2021-03-04 DIAGNOSIS — G459 Transient cerebral ischemic attack, unspecified: Secondary | ICD-10-CM | POA: Diagnosis not present

## 2021-03-09 ENCOUNTER — Other Ambulatory Visit: Payer: Self-pay | Admitting: Sports Medicine

## 2021-03-09 DIAGNOSIS — J4541 Moderate persistent asthma with (acute) exacerbation: Secondary | ICD-10-CM

## 2021-03-11 DIAGNOSIS — G459 Transient cerebral ischemic attack, unspecified: Secondary | ICD-10-CM | POA: Diagnosis not present

## 2021-03-12 DIAGNOSIS — Z8673 Personal history of transient ischemic attack (TIA), and cerebral infarction without residual deficits: Secondary | ICD-10-CM | POA: Diagnosis not present

## 2021-03-12 DIAGNOSIS — G459 Transient cerebral ischemic attack, unspecified: Secondary | ICD-10-CM | POA: Diagnosis not present

## 2021-03-12 DIAGNOSIS — I10 Essential (primary) hypertension: Secondary | ICD-10-CM | POA: Diagnosis not present

## 2021-03-13 ENCOUNTER — Other Ambulatory Visit: Payer: Self-pay

## 2021-03-13 ENCOUNTER — Ambulatory Visit (INDEPENDENT_AMBULATORY_CARE_PROVIDER_SITE_OTHER): Payer: Medicare Other | Admitting: Sports Medicine

## 2021-03-13 DIAGNOSIS — I10 Essential (primary) hypertension: Secondary | ICD-10-CM | POA: Diagnosis not present

## 2021-03-13 DIAGNOSIS — R4701 Aphasia: Secondary | ICD-10-CM | POA: Diagnosis not present

## 2021-03-13 DIAGNOSIS — R1310 Dysphagia, unspecified: Secondary | ICD-10-CM | POA: Insufficient documentation

## 2021-03-13 NOTE — Assessment & Plan Note (Signed)
As above, Marcus Orr had aphasia type symptoms, this may have been a complex migraine. His blood pressures have been fairly labile, typically running in the low 140s over 70s to 80s. It sounds like he was started on amlodipine in addition to his other blood pressure medicines while in the hospital, but this has dropped his blood pressure too low. He will continue lisinopril and HCTZ, but we will do one half tab lisinopril twice a day and one half tab HCTZ twice a day in the spirit of getting a more consistent serum level and thus more consistent blood pressures. He will do a diary and let me know over the next week what his blood pressures are looking like. If they continue to run over 140/90 on average then we will restart the low-dose amlodipine at 2.5 to 5 mg daily.

## 2021-03-13 NOTE — Progress Notes (Signed)
    Procedures performed today:    None.  Independent interpretation of notes and tests performed by another provider:   None.  Brief History, Exam, Impression, and Recommendations:    Aphasia This is a pleasant 87 year old male, he recently had an episode of difficulty speaking, he was cooking with his wife in the kitchen, felt somewhat weak and had to sit down, as his wife was asking him how things were going he had difficulty speaking, he tried to make words but nothing came out. This lasted for about 15 minutes at which point his speech returned and he developed a dull throbbing headache with photophobia and phonophobia. 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 He did touch base with his neurologist recently who has recommended continued aggressive blood pressure control, aspirin, as well as long-term rhythm monitoring. I agree with the entire work-up, I do wonder if his symptoms were simply that of a complex migraine considering the postceding headache, we will continue to work on controlling his blood pressure.  Essential hypertension, benign As above, Cledith had aphasia type symptoms, this may have been a complex migraine. His blood pressures have been fairly labile, typically running in the low 140s over 70s to 80s. It sounds like he was started on amlodipine in addition to his other blood pressure medicines while in the hospital, but this has dropped his blood pressure too low. He will continue lisinopril and HCTZ, but we will do one half tab lisinopril twice a day and one half tab HCTZ twice a day in the spirit of getting a more consistent serum level and thus more consistent blood pressures. He will do a diary and let me know over the next week what his blood  pressures are looking like. If they continue to run over 140/90 on average then we will restart the low-dose amlodipine at 2.5 to 5 mg daily.    ___________________________________________ Gwen Her. Dianah Field, M.D., ABFM., CAQSM. Primary Care and [place] Instructor of [place] of [facility] of Medicine

## 2021-03-13 NOTE — Assessment & Plan Note (Signed)
This is a pleasant 87 year old male, he recently had an episode of difficulty speaking, he was cooking with his wife in the kitchen, felt somewhat weak and had to sit down, as his wife was asking him how things were going he had difficulty speaking, he tried to make words but nothing came out. This lasted for about 15 minutes at which point his speech returned and he developed a dull throbbing headache with photophobia and phonophobia. 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 He did touch base with his neurologist recently who has recommended continued aggressive blood pressure control, aspirin, as well as long-term rhythm monitoring. I agree with the entire work-up, I do wonder if his symptoms were simply that of a complex migraine considering the postceding headache, we will continue to work on controlling his blood pressure.

## 2021-03-15 ENCOUNTER — Encounter: Payer: Self-pay | Admitting: Sports Medicine

## 2021-03-15 DIAGNOSIS — I10 Essential (primary) hypertension: Secondary | ICD-10-CM

## 2021-03-20 ENCOUNTER — Encounter: Payer: Self-pay | Admitting: Sports Medicine

## 2021-03-20 DIAGNOSIS — I10 Essential (primary) hypertension: Secondary | ICD-10-CM

## 2021-03-21 MED ORDER — AMLODIPINE BESYLATE 2.5 MG PO TABS: 2.5000 mg | ORAL_TABLET | Freq: Every day | ORAL | 3 refills | Status: DC

## 2021-03-22 MED ORDER — AMLODIPINE BESYLATE 2.5 MG PO TABS: 2.5000 mg | ORAL_TABLET | Freq: Every day | ORAL | 0 refills | Status: DC

## 2021-03-22 NOTE — Addendum Note (Signed)
Addended by: Fonnie Mu on: 03/22/2021 01:12 PM   Modules accepted: Orders

## 2021-04-09 ENCOUNTER — Other Ambulatory Visit: Payer: Self-pay | Admitting: Sports Medicine

## 2021-04-10 ENCOUNTER — Ambulatory Visit: Payer: Medicare Other | Admitting: Sports Medicine

## 2021-04-12 ENCOUNTER — Ambulatory Visit: Payer: Medicare Other | Admitting: Sports Medicine

## 2021-04-13 ENCOUNTER — Other Ambulatory Visit: Payer: Self-pay | Admitting: Sports Medicine

## 2021-04-13 DIAGNOSIS — I10 Essential (primary) hypertension: Secondary | ICD-10-CM

## 2021-04-21 DIAGNOSIS — Z20822 Contact with and (suspected) exposure to covid-19: Secondary | ICD-10-CM | POA: Diagnosis not present

## 2021-05-27 ENCOUNTER — Ambulatory Visit: Payer: Medicare Other | Admitting: Sports Medicine

## 2021-05-28 DIAGNOSIS — Z20822 Contact with and (suspected) exposure to covid-19: Secondary | ICD-10-CM | POA: Diagnosis not present

## 2021-05-28 DIAGNOSIS — Z03818 Encounter for observation for suspected exposure to other biological agents ruled out: Secondary | ICD-10-CM | POA: Diagnosis not present

## 2021-06-05 ENCOUNTER — Ambulatory Visit (INDEPENDENT_AMBULATORY_CARE_PROVIDER_SITE_OTHER): Payer: Medicare Other | Admitting: Sports Medicine

## 2021-06-05 ENCOUNTER — Other Ambulatory Visit: Payer: Self-pay

## 2021-06-05 DIAGNOSIS — I10 Essential (primary) hypertension: Secondary | ICD-10-CM | POA: Diagnosis not present

## 2021-06-05 DIAGNOSIS — Z0000 Encounter for general adult medical examination without abnormal findings: Secondary | ICD-10-CM

## 2021-06-05 NOTE — Assessment & Plan Note (Signed)
Omer's new blood pressure regimen is amlodipine 2.5 mg daily, hydrochlorothiazide one half tab twice a day, he has been intermittent with his lisinopril and only takes one half tab of lisinopril twice a day when his blood pressure runs high, blood pressure looks pretty good today, no changes in plan.

## 2021-06-05 NOTE — Progress Notes (Signed)
° ° °  Procedures performed today:    None.  Independent interpretation of notes and tests performed by another provider:   None.  Brief History, Exam, Impression, and Recommendations:    Annual physical exam Fontaine has not yet gotten his Tdap or Shingrix, he will go to the pharmacy and get it done.  Essential hypertension, benign Conn's new blood pressure regimen is amlodipine 2.5 mg daily, hydrochlorothiazide one half tab twice a day, he has been intermittent with his lisinopril and only takes one half tab of lisinopril twice a day when his blood pressure runs high, blood pressure looks pretty good today, no changes in plan.    ___________________________________________ Gwen Her. Dianah Field, M.D., ABFM., CAQSM. Primary Care and [place] Instructor of [place] of [facility] of Medicine

## 2021-06-05 NOTE — Assessment & Plan Note (Signed)
Marcus Orr has not yet gotten his Tdap or Shingrix, he will go to the pharmacy and get it done.

## 2021-07-01 DIAGNOSIS — E785 Hyperlipidemia, unspecified: Secondary | ICD-10-CM | POA: Diagnosis not present

## 2021-07-01 DIAGNOSIS — I10 Essential (primary) hypertension: Secondary | ICD-10-CM | POA: Diagnosis not present

## 2021-07-01 DIAGNOSIS — Z9049 Acquired absence of other specified parts of digestive tract: Secondary | ICD-10-CM | POA: Diagnosis not present

## 2021-07-01 DIAGNOSIS — I088 Other rheumatic multiple valve diseases: Secondary | ICD-10-CM | POA: Diagnosis not present

## 2021-07-01 DIAGNOSIS — Z85038 Personal history of other malignant neoplasm of large intestine: Secondary | ICD-10-CM | POA: Diagnosis not present

## 2021-07-01 DIAGNOSIS — I350 Nonrheumatic aortic (valve) stenosis: Secondary | ICD-10-CM | POA: Diagnosis not present

## 2021-07-05 DIAGNOSIS — Z20822 Contact with and (suspected) exposure to covid-19: Secondary | ICD-10-CM | POA: Diagnosis not present

## 2021-07-10 DIAGNOSIS — L600 Ingrowing nail: Secondary | ICD-10-CM | POA: Diagnosis not present

## 2021-07-10 DIAGNOSIS — M2012 Hallux valgus (acquired), left foot: Secondary | ICD-10-CM | POA: Diagnosis not present

## 2021-07-10 DIAGNOSIS — L03031 Cellulitis of right toe: Secondary | ICD-10-CM | POA: Diagnosis not present

## 2021-07-10 DIAGNOSIS — M2011 Hallux valgus (acquired), right foot: Secondary | ICD-10-CM | POA: Diagnosis not present

## 2021-07-17 DIAGNOSIS — L309 Dermatitis, unspecified: Secondary | ICD-10-CM | POA: Diagnosis not present

## 2021-07-17 DIAGNOSIS — D239 Other benign neoplasm of skin, unspecified: Secondary | ICD-10-CM | POA: Diagnosis not present

## 2021-07-17 DIAGNOSIS — L821 Other seborrheic keratosis: Secondary | ICD-10-CM | POA: Diagnosis not present

## 2021-07-17 DIAGNOSIS — Z85828 Personal history of other malignant neoplasm of skin: Secondary | ICD-10-CM | POA: Diagnosis not present

## 2021-07-17 DIAGNOSIS — L089 Local infection of the skin and subcutaneous tissue, unspecified: Secondary | ICD-10-CM | POA: Diagnosis not present

## 2021-07-17 DIAGNOSIS — L570 Actinic keratosis: Secondary | ICD-10-CM | POA: Diagnosis not present

## 2021-07-17 DIAGNOSIS — D045 Carcinoma in situ of skin of trunk: Secondary | ICD-10-CM | POA: Diagnosis not present

## 2021-07-17 DIAGNOSIS — D485 Neoplasm of uncertain behavior of skin: Secondary | ICD-10-CM | POA: Diagnosis not present

## 2021-07-17 DIAGNOSIS — L814 Other melanin hyperpigmentation: Secondary | ICD-10-CM | POA: Diagnosis not present

## 2021-07-17 DIAGNOSIS — Z08 Encounter for follow-up examination after completed treatment for malignant neoplasm: Secondary | ICD-10-CM | POA: Diagnosis not present

## 2021-07-17 DIAGNOSIS — X32XXXA Exposure to sunlight, initial encounter: Secondary | ICD-10-CM | POA: Diagnosis not present

## 2021-07-28 DIAGNOSIS — B029 Zoster without complications: Secondary | ICD-10-CM | POA: Diagnosis not present

## 2021-07-29 ENCOUNTER — Encounter: Payer: Self-pay | Admitting: Sports Medicine

## 2021-07-31 ENCOUNTER — Ambulatory Visit (INDEPENDENT_AMBULATORY_CARE_PROVIDER_SITE_OTHER): Payer: Medicare Other | Admitting: Sports Medicine

## 2021-07-31 DIAGNOSIS — Z0000 Encounter for general adult medical examination without abnormal findings: Secondary | ICD-10-CM | POA: Diagnosis not present

## 2021-07-31 NOTE — Patient Instructions (Addendum)
?MEDICARE ANNUAL WELLNESS VISIT ?Health Maintenance Summary and Written Plan of Care ? ?[name] , ? ?Thank you for allowing me to perform your Medicare Annual Wellness Visit and for your ongoing commitment to your health.  ? ?Health Maintenance & Immunization History ?Health Maintenance  ?Topic Date Due  ?? COVID-19 Vaccine (4 - Booster for Pfizer series) 08/16/2021 (Originally 04/12/2021)  ?? Zoster Vaccines- Shingrix (1 of 2) 10/30/2021 (Originally 10/19/1952)  ?? INFLUENZA VACCINE  11/12/2021  ?? TETANUS/TDAP  06/07/2030  ?? Pneumonia Vaccine 23+ Years old  Completed  ?? HPV VACCINES  Aged Out  ? ?Immunization History  ?Administered Date(s) Administered  ?? Fluad Quad(high Dose 65+) 01/24/2019  ?? Influenza Whole 04/15/2011  ?? Influenza, High Dose Seasonal PF 01/01/2017, 01/24/2018, 02/22/2021  ?? Influenza,inj,Quad PF,6+ Mos 01/13/2013, 01/04/2015  ?? Influenza-Unspecified 01/24/2019, 01/13/2020  ?? PFIZER(Purple Top)SARS-COV-2 Vaccination 05/12/2019, 06/07/2019  ?? Pension scheme manager 110yr & up 02/15/2021  ?? Pneumococcal Conjugate-13 01/04/2015  ?? Pneumococcal Polysaccharide-23 06/20/2016  ?? Pneumococcal-Unspecified 06/20/2016  ?? Td 06/07/2020  ? ? ?These are the patient goals that we discussed: ? Goals Addressed   ?  ?  ?  ?  ?  ? This Visit's Progress  ? ?  Patient Stated (pt-stated)     ?   07/31/2021 ?AWV Goal: Exercise for General Health ? ?Patient will verbalize understanding of the benefits of increased physical activity: ?Exercising regularly is important. It will improve your overall fitness, flexibility, and endurance. ?Regular exercise also will improve your overall health. It can help you control your weight, reduce stress, and improve your bone density. ?Over the next year, patient will increase physical activity as tolerated with a goal of at least 150 minutes of moderate physical activity per week.  ?You can tell that you are exercising at a moderate intensity if your  heart starts beating faster and you start breathing faster but can still hold a conversation. ?Moderate-intensity exercise ideas include: ?Walking 1 mile (1.6 km) in about 15 minutes ?Biking ?Hiking ?Golfing ?Dancing ?Water aerobics ?Patient will verbalize understanding of everyday activities that increase physical activity by providing examples like the following: ?Yard work, such as: ?Pushing a lConservation officer, nature?Raking and bagging leaves ?Washing your car ?Pushing a stroller ?Shoveling snow ?Gardening ?Washing windows or floors ?Patient will be able to explain general safety guidelines for exercising:  ?Before you start a new exercise program, talk with your health care provider. ?Do not exercise so much that you hurt yourself, feel dizzy, or get very short of breath. ?Wear comfortable clothes and wear shoes with good support. ?Drink plenty of water while you exercise to prevent dehydration or heat stroke. ?Work out until your breathing and your heartbeat get faster.  ?  ?  ?  ? ?This is a list of Health Maintenance Items that are overdue or due now: ?Shingrix ?  ? ?Orders/Referrals Placed Today: ?No orders of the defined types were placed in this encounter. ? ?(Contact our referral department at [phone] you have not spoken with someone about your referral appointment within the next 5 days)  ? ? ?Follow-up Plan ?Follow-up with TSilverio Decamp MD as planned ?Please call and schedule your shingrix vaccine at the pharmacy.  ?Medicare wellness visit in one year. ?Patient will access AVS on my chart.  ? ? ? ?  ?Health Maintenance, Male ?Adopting a healthy lifestyle and getting preventive care are important in promoting health and wellness. Ask your health care provider about: ?The right schedule  for you to have regular tests and exams. ?Things you can do on your own to prevent diseases and keep yourself healthy. ?What should I know about diet, weight, and exercise? ?Eat a healthy diet ? ?Eat a diet that  includes plenty of vegetables, fruits, low-fat dairy products, and lean protein. ?Do not eat a lot of foods that are high in solid fats, added sugars, or sodium. ?Maintain a healthy weight ?Body mass index (BMI) is a measurement that can be used to identify possible weight problems. It estimates body fat based on height and weight. Your health care provider can help determine your BMI and help you achieve or maintain a healthy weight. ?Get regular exercise ?Get regular exercise. This is one of the most important things you can do for your health. Most adults should: ?Exercise for at least 150 minutes each week. The exercise should increase your heart rate and make you sweat (moderate-intensity exercise). ?Do strengthening exercises at least twice a week. This is in addition to the moderate-intensity exercise. ?Spend less time sitting. Even light physical activity can be beneficial. ?Watch cholesterol and blood lipids ?Have your blood tested for lipids and cholesterol at 86 years of age, then have this test every 5 years. ?You may need to have your cholesterol levels checked more often if: ?Your lipid or cholesterol levels are high. ?You are older than 86 years of age. ?You are at high risk for heart disease. ?What should I know about cancer screening? ?Many types of cancers can be detected early and may often be prevented. Depending on your health history and family history, you may need to have cancer screening at various ages. This may include screening for: ?Colorectal cancer. ?Prostate cancer. ?Skin cancer. ?Lung cancer. ?What should I know about heart disease, diabetes, and high blood pressure? ?Blood pressure and heart disease ?High blood pressure causes heart disease and increases the risk of stroke. This is more likely to develop in people who have high blood pressure readings or are overweight. ?Talk with your health care provider about your target blood pressure readings. ?Have your blood pressure  checked: ?Every 3-5 years if you are 87-27 years of age. ?Every year if you are 25 years old or older. ?If you are between the ages of 91 and 109 and are a current or former smoker, ask your health care provider if you should have a one-time screening for abdominal aortic aneurysm (AAA). ?Diabetes ?Have regular diabetes screenings. This checks your fasting blood sugar level. Have the screening done: ?Once every three years after age 54 if you are at a normal weight and have a low risk for diabetes. ?More often and at a younger age if you are overweight or have a high risk for diabetes. ?What should I know about preventing infection? ?Hepatitis B ?If you have a higher risk for hepatitis B, you should be screened for this virus. Talk with your health care provider to find out if you are at risk for hepatitis B infection. ?Hepatitis C ?Blood testing is recommended for: ?Everyone born from [birth year] through 1963. ?Anyone with known risk factors for hepatitis C. ?Sexually transmitted infections (STIs) ?You should be screened each year for STIs, including gonorrhea and chlamydia, if: ?You are sexually active and are younger than 86 years of age. ?You are older than 86 years of age and your health care provider tells you that you are at risk for this type of infection. ?Your sexual activity has changed since you were last screened, and  you are at increased risk for chlamydia or gonorrhea. Ask your health care provider if you are at risk. ?Ask your health care provider about whether you are at high risk for HIV. Your health care provider may recommend a prescription medicine to help prevent HIV infection. If you choose to take medicine to prevent HIV, you should first get tested for HIV. You should then be tested every 3 months for as long as you are taking the medicine. ?Follow these instructions at home: ?Alcohol use ?Do not drink alcohol if your health care provider tells you not to drink. ?If you drink alcohol: ?Limit how  much you have to 0-2 drinks a day. ?Know how much alcohol is in your drink. In the [place] one drink equals one 12 oz bottle of beer (355 mL), one 5 oz glass of wine (148 mL), or one 1? oz glass of hard liquor (44 mL

## 2021-07-31 NOTE — Progress Notes (Signed)
? ? ?MEDICARE ANNUAL WELLNESS VISIT ? ?07/31/2021 ? ?Telephone Visit Disclaimer ?This Medicare AWV was conducted by telephone due to national recommendations for restrictions regarding the COVID-19 Pandemic (e.g. social distancing).  I verified, using two identifiers, that I am speaking with Marcus Brick Sr. or their authorized healthcare agent. I discussed the limitations, risks, security, and privacy concerns of performing an evaluation and management service by telephone and the potential availability of an in-person appointment in the future. The patient expressed understanding and agreed to proceed.  ?Location of Patient: Home ?Location of Provider (nurse):  In the office. ? ?Subjective:  ? ? ?Marcus Brick Sr. is a 88 y.o. male patient of Thekkekandam, Gwen Her, MD who had a Medicare Annual Wellness Visit today via telephone. Korban is Retired and lives with their spouse. he has 3 children. he reports that he is socially active and does interact with friends/family regularly. he is minimally physically active and enjoys traveling. ? ?Patient Care Team: ?Silverio Decamp, MD as PCP - General (Family Medicine) ? ? ?  10/03/2016  ?  5:46 PM 09/29/2016  ?  3:04 PM 10/12/2015  ? 10:21 AM 10/10/2015  ? 11:16 AM 08/30/2013  ?  3:21 PM  ?Advanced Directives  ?Does Patient Have a Medical Advance Directive? No No No No Patient does not have advance directive;Patient would like information  ?Would patient like information on creating a medical advance directive? No - Patient declined  No - patient declined information  Advance directive packet given  ? ? ?Hospital Utilization Over the Past 12 Months: ?# of hospitalizations or ER visits: 0 ?# of surgeries: 0 ? ?Review of Systems    ?Patient reports that his overall health is unchanged compared to last year. ? ?History obtained from chart review and the patient ? ?Patient Reported Readings (BP, Pulse, CBG, Weight, etc) ?none ? ?Pain Assessment ?Pain : No/denies  pain ? ?  ? ?Current Medications & Allergies (verified) ?Allergies as of 07/31/2021   ? ?   Reactions  ? Tamiflu [oseltamivir] Shortness Of Breath  ? Actual allergic reaction to Tamiflu  ? ?  ? ?  ?Medication List  ?  ? ?  ? Accurate as of July 31, 2021 11:13 AM. If you have any questions, ask your nurse or doctor.  ?  ?  ? ?  ? ?albuterol 108 (90 Base) MCG/ACT inhaler ?Commonly known as: VENTOLIN HFA ?Inhale 2 puffs into the lungs every 6 (six) hours as needed for wheezing. ?  ?amLODipine 10 MG tablet ?Commonly known as: NORVASC ?Take 1 tablet by mouth daily. ?  ?amLODipine 2.5 MG tablet ?Commonly known as: NORVASC ?Take 1 tablet (2.5 mg total) by mouth daily before lunch. ?  ?B-D 3CC LUER-LOK SYR 18GX1-1/2 18G X 1-1/2" 3 ML Misc ?Generic drug: SYRINGE-NEEDLE (DISP) 3 ML ?USE AS DIRECTED TO DRAW UP TESTOSTERONE ?  ?Cosopt 22.3-6.8 MG/ML ophthalmic solution ?Generic drug: dorzolamide-timolol ?INSTILL 1 DROP INTO BOTH   EYES TWICE DAILY. ?  ?ferrous sulfate 325 (65 FE) MG EC tablet ?Take 1 tablet (325 mg total) by mouth 3 (three) times daily with meals. ?  ?finasteride 5 MG tablet ?Commonly known as: PROSCAR ?TAKE 1 TABLET EVERY EVENING ?  ?fluticasone 50 MCG/ACT nasal spray ?Commonly known as: FLONASE ?USE 1 SPRAY IN EACH NOSTRILDAILY ?  ?Folic + M08 676-1950 MCG Tabs ?Take 1 tablet by mouth daily. ?  ?hydrochlorothiazide 25 MG tablet ?Commonly known as: HYDRODIURIL ?Take 0.5 tablets (12.5 mg total) by  mouth 2 (two) times daily. ?  ?levothyroxine 75 MCG tablet ?Commonly known as: SYNTHROID ?Take 1 tablet (75 mcg total) by mouth daily. ?  ?lisinopril 40 MG tablet ?Commonly known as: ZESTRIL ?Take 0.5 tablets (20 mg total) by mouth in the morning and at bedtime. ?  ?loratadine 10 MG tablet ?Commonly known as: CLARITIN ?Take 1 tablet (10 mg total) by mouth daily. ?  ?montelukast 10 MG tablet ?Commonly known as: SINGULAIR ?TAKE 1 TABLET BY MOUTH EVERYDAY AT BEDTIME ?  ?NEEDLE (DISP) 22 G 22G X 1-1/2" Misc ?Inject into  muscle every 14 days. ?  ?nystatin 100000 UNIT/ML suspension ?Commonly known as: MYCOSTATIN ?Take 5 mLs (500,000 Units total) by mouth 4 (four) times daily. Swish for 30 seconds and spit out. ?  ?omeprazole 40 MG capsule ?Commonly known as: PRILOSEC ?TAKE 1 CAPSULE AT BEDTIME ?  ?simvastatin 40 MG tablet ?Commonly known as: ZOCOR ?TAKE 1 TABLET EVERY EVENING ?  ?tadalafil 10 MG tablet ?Commonly known as: Cialis ?Take 1 tablet (10 mg total) by mouth daily as needed. ?  ?terazosin 5 MG capsule ?Commonly known as: HYTRIN ?TAKE 1 CAPSULE AT BEDTIME ?  ?Trelegy Ellipta 200-62.5-25 MCG/ACT Aepb ?Generic drug: Fluticasone-Umeclidin-Vilant ?Inhale 1 puff into the lungs daily. ?  ?triamcinolone cream 0.1 % ?Commonly known as: KENALOG ?Apply topically 2 (two) times daily. ?  ?valACYclovir 1000 MG tablet ?Commonly known as: VALTREX ?Take 1,000 mg by mouth 3 (three) times daily. ?  ?vitamin B-12 1000 MCG tablet ?Commonly known as: CYANOCOBALAMIN ?Take 1 tablet (1,000 mcg total) by mouth daily. ?  ? ?  ? ? ?History (reviewed): ?Past Medical History:  ?Diagnosis Date  ? Aortic stenosis   ? mild AS 09/2015 echo  ? Aortic valve disease   ? mild  ? Asthma   ? as child  ? Basal ganglia hemorrhage (HCC)   ? left basal ganlia intraparenchymal hemoorhage 06/17/16 (in the setting of HTN emergency)  ? Cataract 2009  ? Chronic kidney disease 2015  ? Colon cancer ([place]) 10/10/2015  ? COPD (chronic obstructive pulmonary disease) ([place]) 2018  ? GERD (gastroesophageal reflux disease)   ? Glaucoma 2010  ? Hyperlipidemia   ? Hypertension   ? Hypothyroidism   ? Iron deficiency anemia due to chronic blood loss 10/02/2015  ? Lower GI bleed 10/02/2015  ? Skin cancer   ? Stroke [facility]) 2021  ? Thyroid disease   ? TIA (transient ischemic attack)   ? ?Past Surgical History:  ?Procedure Laterality Date  ? COLON RESECTION    ? ca  ? COLON SURGERY  2018  ? EYE SURGERY    ? JOINT REPLACEMENT    ? bil  ? REPLACEMENT TOTAL KNEE    ? TOTAL HIP ARTHROPLASTY Right  10/03/2016  ? TOTAL HIP ARTHROPLASTY Right 10/03/2016  ? Procedure: TOTAL HIP ARTHROPLASTY ANTERIOR APPROACH;  Surgeon: Dorna Leitz, MD;  Location: [facility];  Service: Orthopedics;  Laterality: Right;  ? ?No family history on file. ?Social History  ? ?Socioeconomic History  ? Marital status: Married  ?  Spouse name: Katharine Look  ? Number of children: 3  ? Years of education: 27  ? Highest education level: Some college, no degree  ?Occupational History  ? Occupation: Retired  ?Tobacco Use  ? Smoking status: Never  ? Smokeless tobacco: Never  ?Vaping Use  ? Vaping Use: Never used  ?Substance and Sexual Activity  ? Alcohol use: Never  ? Drug use: Never  ? Sexual activity: Yes  ?Other  Topics Concern  ? Not on file  ?Social History Narrative  ? Lives with spouse. He has three children. He enjoys travelling.  ? ?Social Determinants of Health  ? ?Financial Resource Strain: Low Risk   ? Difficulty of Paying Living Expenses: Not hard at all  ?Food Insecurity: No Food Insecurity  ? Worried About Charity fundraiser in the Last Year: Never true  ? Ran Out of Food in the Last Year: Never true  ?Transportation Needs: No Transportation Needs  ? Lack of Transportation (Medical): No  ? Lack of Transportation (Non-Medical): No  ?Physical Activity: Insufficiently Active  ? Days of Exercise per Week: 2 days  ? Minutes of Exercise per Session: 10 min  ?Stress: No Stress Concern Present  ? Feeling of Stress : Not at all  ?Social Connections: Socially Integrated  ? Frequency of Communication with Friends and Family: Twice a week  ? Frequency of Social Gatherings with Friends and Family: Once a week  ? Attends Religious Services: More than 4 times per year  ? Active Member of Clubs or Organizations: Yes  ? Attends Archivist Meetings: More than 4 times per year  ? Marital Status: Married  ? ? ?Activities of Daily Living ? ?  07/31/2021  ? 11:06 AM 07/26/2021  ? 11:57 AM  ?In your present state of health, do you have any difficulty  performing the following activities:  ?Hearing? 1 0  ?Comment has some damage to the left ear which causes a little difficulty hearing. He has hearing aid for it but he does not use it.   ?Vision?  0  ?Difficulty co

## 2021-08-05 ENCOUNTER — Other Ambulatory Visit: Payer: Self-pay | Admitting: Sports Medicine

## 2021-08-09 DIAGNOSIS — Z20822 Contact with and (suspected) exposure to covid-19: Secondary | ICD-10-CM | POA: Diagnosis not present

## 2021-08-14 DIAGNOSIS — Z20822 Contact with and (suspected) exposure to covid-19: Secondary | ICD-10-CM | POA: Diagnosis not present

## 2021-08-15 DIAGNOSIS — Z20822 Contact with and (suspected) exposure to covid-19: Secondary | ICD-10-CM | POA: Diagnosis not present

## 2021-08-20 ENCOUNTER — Encounter: Payer: Self-pay | Admitting: Sports Medicine

## 2021-08-21 ENCOUNTER — Ambulatory Visit (INDEPENDENT_AMBULATORY_CARE_PROVIDER_SITE_OTHER): Payer: Medicare Other

## 2021-08-21 ENCOUNTER — Encounter: Payer: Self-pay | Admitting: Sports Medicine

## 2021-08-21 ENCOUNTER — Ambulatory Visit (INDEPENDENT_AMBULATORY_CARE_PROVIDER_SITE_OTHER): Payer: Medicare Other | Admitting: Sports Medicine

## 2021-08-21 DIAGNOSIS — J4541 Moderate persistent asthma with (acute) exacerbation: Secondary | ICD-10-CM | POA: Diagnosis not present

## 2021-08-21 DIAGNOSIS — R0602 Shortness of breath: Secondary | ICD-10-CM

## 2021-08-21 DIAGNOSIS — J479 Bronchiectasis, uncomplicated: Secondary | ICD-10-CM | POA: Diagnosis not present

## 2021-08-21 MED ORDER — AZITHROMYCIN 250 MG PO TABS: ORAL_TABLET | ORAL | 0 refills | Status: DC

## 2021-08-21 MED ORDER — PREDNISONE 50 MG PO TABS: ORAL_TABLET | ORAL | 0 refills | Status: DC

## 2021-08-21 NOTE — Progress Notes (Signed)
? ? ?  Procedures performed today:   ? ?None. ? ?Independent interpretation of notes and tests performed by another provider:  ? ?None. ? ?Brief History, Exam, Impression, and Recommendations:   ? ?Asthmatic bronchitis with bronchiectasis ?This is a very pleasant 88 year old male, history of asthmatic bronchitis, he has bronchiectasis on CT scans. ?Long story short we ultimately got him on a higher dose of Trelegy and his symptoms improved considerably long-term, the last exacerbation was a year ago. ?He has also historically been out walking and active which he was unable to do before, more recently for the past week or so he had increasing shortness of breath, chest tightness with deep breathing, no fevers or chills, he did a COVID test yesterday that was negative. ?On exam his oropharyngeal exam is normal, he does not have any wheezing, but is not really moving good air. ?No lower extremity swelling, negative Homans' sign bilaterally. ?Cardiac exam is unrevealing and similar to baseline. ?I am concerned he has a pneumonia, we will treat him aggressively with steroids, azithromycin, chest x-ray. ?If insufficiently improved we will consider getting labs. ? ? ? ?___________________________________________ ?Gwen Her. Dianah Field, M.D., ABFM., CAQSM. ?Primary Care and Sports Medicine ?[place] ? ?Adjunct Instructor of Family Medicine  ?[employer] ?

## 2021-08-21 NOTE — Telephone Encounter (Signed)
Patient scheduled.

## 2021-08-21 NOTE — Assessment & Plan Note (Signed)
This is a very pleasant 88 year old male, history of asthmatic bronchitis, he has bronchiectasis on CT scans. ?Long story short we ultimately got him on a higher dose of Trelegy and his symptoms improved considerably long-term, the last exacerbation was a year ago. ?He has also historically been out walking and active which he was unable to do before, more recently for the past week or so he had increasing shortness of breath, chest tightness with deep breathing, no fevers or chills, he did a COVID test yesterday that was negative. ?On exam his oropharyngeal exam is normal, he does not have any wheezing, but is not really moving good air. ?No lower extremity swelling, negative Homans' sign bilaterally. ?Cardiac exam is unrevealing and similar to baseline. ?I am concerned he has a pneumonia, we will treat him aggressively with steroids, azithromycin, chest x-ray. ?If insufficiently improved we will consider getting labs. ?

## 2021-08-21 NOTE — Telephone Encounter (Signed)
LVM for patient to call back to get an appt scheduled. AMUCK ?

## 2021-08-23 ENCOUNTER — Telehealth: Payer: Self-pay | Admitting: Sports Medicine

## 2021-08-23 NOTE — Telephone Encounter (Signed)
Pt seen for COPD exacerbation 2 days ago, started on steroids, azithro, bronchodilators with continued use of his Trelegy.  Euvolemic on exam without S&S CHF, no CP.  Called today with persistent SOB, not worsened.  Speaking full sentences.  Vitals stable (Bp 183U systolic and pulse 37D).  Advised continue treatment through the weekend and we would check labs if not turning the corner by the end of the weekend looking for complicating factors such as anemia. ?

## 2021-08-28 ENCOUNTER — Encounter: Payer: Self-pay | Admitting: Sports Medicine

## 2021-08-28 ENCOUNTER — Ambulatory Visit (INDEPENDENT_AMBULATORY_CARE_PROVIDER_SITE_OTHER): Payer: Medicare Other | Admitting: Sports Medicine

## 2021-08-28 DIAGNOSIS — H401132 Primary open-angle glaucoma, bilateral, moderate stage: Secondary | ICD-10-CM | POA: Diagnosis not present

## 2021-08-28 DIAGNOSIS — J4541 Moderate persistent asthma with (acute) exacerbation: Secondary | ICD-10-CM | POA: Diagnosis not present

## 2021-08-28 DIAGNOSIS — I10 Essential (primary) hypertension: Secondary | ICD-10-CM | POA: Diagnosis not present

## 2021-08-28 NOTE — Progress Notes (Signed)
? ? ?  Procedures performed today:   ? ?None. ? ?Independent interpretation of notes and tests performed by another provider:  ? ?None. ? ?Brief History, Exam, Impression, and Recommendations:   ? ?Asthmatic bronchitis with bronchiectasis ?Pleasant 88 year old male, history of asthmatic bronchitis with bronchiectasis on CT scans, he had initially been on a higher dose of Trelegy which improved his symptoms considerably, last exacerbation was a year ago, more recently he developed another exacerbation with shortness of breath, chest tightness with deep breathing, no fevers or chills. ?COVID test negative. ?He was not moving good air at the last visit, cardiac exam was unrevealing, he also had no lower extremity swelling and a negative Homans' sign bilaterally. ?We treated him aggressively with steroids, azithromycin, chest x-ray was unrevealing. ?He turned the corner after the weekend and is doing well today. ?I would like to go and check some labs as he did have some anemia historically. ? ?Essential hypertension, benign ?Marcus Orr is doing amlodipine 5 mg twice daily, HCTZ one half tab twice a day, lisinopril one half tab twice a day. ?Blood pressure at home is typically 130s over 80s, it is elevated here, he does have a whitecoat component. ?No changes. ? ? ? ?___________________________________________ ?Gwen Her. Dianah Field, M.D., ABFM., CAQSM. ?Primary Care and Sports Medicine ?[place] ? ?Adjunct Instructor of Family Medicine  ?[employer] ?

## 2021-08-28 NOTE — Assessment & Plan Note (Signed)
Hoa is doing amlodipine 5 mg twice daily, HCTZ one half tab twice a day, lisinopril one half tab twice a day. ?Blood pressure at home is typically 130s over 80s, it is elevated here, he does have a whitecoat component. ?No changes. ?

## 2021-08-28 NOTE — Assessment & Plan Note (Signed)
Pleasant 88 year old male, history of asthmatic bronchitis with bronchiectasis on CT scans, he had initially been on a higher dose of Trelegy which improved his symptoms considerably, last exacerbation was a year ago, more recently he developed another exacerbation with shortness of breath, chest tightness with deep breathing, no fevers or chills. ?COVID test negative. ?He was not moving good air at the last visit, cardiac exam was unrevealing, he also had no lower extremity swelling and a negative Homans' sign bilaterally. ?We treated him aggressively with steroids, azithromycin, chest x-ray was unrevealing. ?He turned the corner after the weekend and is doing well today. ?I would like to go and check some labs as he did have some anemia historically. ?

## 2021-08-29 LAB — COMPLETE METABOLIC PANEL WITHOUT GFR
AG Ratio: 1.5 (calc) (ref 1.0–2.5)
ALT: 16 U/L (ref 9–46)
AST: 17 U/L (ref 10–35)
Albumin: 3.7 g/dL (ref 3.6–5.1)
Alkaline phosphatase (APISO): 65 U/L (ref 35–144)
BUN/Creatinine Ratio: 20 (calc) (ref 6–22)
BUN: 29 mg/dL — ABNORMAL HIGH (ref 7–25)
CO2: 25 mmol/L (ref 20–32)
Calcium: 8.7 mg/dL (ref 8.6–10.3)
Chloride: 100 mmol/L (ref 98–110)
Creat: 1.43 mg/dL — ABNORMAL HIGH (ref 0.70–1.22)
Globulin: 2.4 g/dL (ref 1.9–3.7)
Glucose, Bld: 93 mg/dL (ref 65–99)
Potassium: 4.4 mmol/L (ref 3.5–5.3)
Sodium: 134 mmol/L — ABNORMAL LOW (ref 135–146)
Total Bilirubin: 0.4 mg/dL (ref 0.2–1.2)
Total Protein: 6.1 g/dL (ref 6.1–8.1)
eGFR: 48 mL/min/1.73m2 — ABNORMAL LOW

## 2021-08-29 LAB — CBC
HCT: 37 % — ABNORMAL LOW (ref 38.5–50.0)
Hemoglobin: 12.1 g/dL — ABNORMAL LOW (ref 13.2–17.1)
MCH: 29.5 pg (ref 27.0–33.0)
MCHC: 32.7 g/dL (ref 32.0–36.0)
MCV: 90.2 fL (ref 80.0–100.0)
MPV: 9.4 fL (ref 7.5–12.5)
Platelets: 304 Thousand/uL (ref 140–400)
RBC: 4.1 Million/uL — ABNORMAL LOW (ref 4.20–5.80)
RDW: 14.8 % (ref 11.0–15.0)
WBC: 10.1 Thousand/uL (ref 3.8–10.8)

## 2021-09-03 DIAGNOSIS — D045 Carcinoma in situ of skin of trunk: Secondary | ICD-10-CM | POA: Diagnosis not present

## 2021-09-03 DIAGNOSIS — L0101 Non-bullous impetigo: Secondary | ICD-10-CM | POA: Diagnosis not present

## 2021-09-03 DIAGNOSIS — D485 Neoplasm of uncertain behavior of skin: Secondary | ICD-10-CM | POA: Diagnosis not present

## 2021-10-09 DIAGNOSIS — D045 Carcinoma in situ of skin of trunk: Secondary | ICD-10-CM | POA: Diagnosis not present

## 2021-11-04 ENCOUNTER — Encounter: Payer: Self-pay | Admitting: Sports Medicine

## 2021-11-05 MED ORDER — AMLODIPINE BESYLATE 5 MG PO TABS
5.0000 mg | ORAL_TABLET | Freq: Two times a day (BID) | ORAL | 3 refills | Status: DC
Start: 2021-11-05 — End: 2023-02-24

## 2021-11-05 NOTE — Telephone Encounter (Signed)
Last prescribed as 2.5 mg of Amlodipine. Please review and advise.

## 2021-11-06 ENCOUNTER — Other Ambulatory Visit: Payer: Self-pay | Admitting: Sports Medicine

## 2021-11-06 DIAGNOSIS — J4541 Moderate persistent asthma with (acute) exacerbation: Secondary | ICD-10-CM

## 2021-11-13 DIAGNOSIS — R079 Chest pain, unspecified: Secondary | ICD-10-CM | POA: Diagnosis not present

## 2021-11-13 DIAGNOSIS — E785 Hyperlipidemia, unspecified: Secondary | ICD-10-CM | POA: Diagnosis not present

## 2021-11-13 DIAGNOSIS — R42 Dizziness and giddiness: Secondary | ICD-10-CM | POA: Diagnosis not present

## 2021-11-13 DIAGNOSIS — Z79890 Hormone replacement therapy (postmenopausal): Secondary | ICD-10-CM | POA: Diagnosis not present

## 2021-11-13 DIAGNOSIS — I517 Cardiomegaly: Secondary | ICD-10-CM | POA: Diagnosis not present

## 2021-11-13 DIAGNOSIS — R231 Pallor: Secondary | ICD-10-CM | POA: Diagnosis not present

## 2021-11-13 DIAGNOSIS — R110 Nausea: Secondary | ICD-10-CM | POA: Diagnosis not present

## 2021-11-13 DIAGNOSIS — Z79899 Other long term (current) drug therapy: Secondary | ICD-10-CM | POA: Diagnosis not present

## 2021-11-13 DIAGNOSIS — N183 Chronic kidney disease, stage 3 unspecified: Secondary | ICD-10-CM | POA: Diagnosis not present

## 2021-11-13 DIAGNOSIS — J984 Other disorders of lung: Secondary | ICD-10-CM | POA: Diagnosis not present

## 2021-11-13 DIAGNOSIS — Z8673 Personal history of transient ischemic attack (TIA), and cerebral infarction without residual deficits: Secondary | ICD-10-CM | POA: Diagnosis not present

## 2021-11-13 DIAGNOSIS — E039 Hypothyroidism, unspecified: Secondary | ICD-10-CM | POA: Diagnosis not present

## 2021-11-13 DIAGNOSIS — E876 Hypokalemia: Secondary | ICD-10-CM | POA: Diagnosis not present

## 2021-11-13 DIAGNOSIS — J449 Chronic obstructive pulmonary disease, unspecified: Secondary | ICD-10-CM | POA: Diagnosis not present

## 2021-11-13 DIAGNOSIS — R4020 Unspecified coma: Secondary | ICD-10-CM | POA: Diagnosis not present

## 2021-11-13 DIAGNOSIS — R918 Other nonspecific abnormal finding of lung field: Secondary | ICD-10-CM | POA: Diagnosis not present

## 2021-11-13 DIAGNOSIS — Z7982 Long term (current) use of aspirin: Secondary | ICD-10-CM | POA: Diagnosis not present

## 2021-11-13 DIAGNOSIS — J9811 Atelectasis: Secondary | ICD-10-CM | POA: Diagnosis not present

## 2021-11-13 DIAGNOSIS — R61 Generalized hyperhidrosis: Secondary | ICD-10-CM | POA: Diagnosis not present

## 2021-11-13 DIAGNOSIS — R55 Syncope and collapse: Secondary | ICD-10-CM | POA: Diagnosis not present

## 2021-11-13 DIAGNOSIS — I129 Hypertensive chronic kidney disease with stage 1 through stage 4 chronic kidney disease, or unspecified chronic kidney disease: Secondary | ICD-10-CM | POA: Diagnosis not present

## 2021-11-13 DIAGNOSIS — I2510 Atherosclerotic heart disease of native coronary artery without angina pectoris: Secondary | ICD-10-CM | POA: Diagnosis not present

## 2021-11-14 DIAGNOSIS — R079 Chest pain, unspecified: Secondary | ICD-10-CM | POA: Diagnosis not present

## 2021-11-14 DIAGNOSIS — E785 Hyperlipidemia, unspecified: Secondary | ICD-10-CM | POA: Diagnosis not present

## 2021-11-14 DIAGNOSIS — I2510 Atherosclerotic heart disease of native coronary artery without angina pectoris: Secondary | ICD-10-CM | POA: Diagnosis not present

## 2021-11-14 DIAGNOSIS — I080 Rheumatic disorders of both mitral and aortic valves: Secondary | ICD-10-CM | POA: Diagnosis not present

## 2021-11-14 DIAGNOSIS — E039 Hypothyroidism, unspecified: Secondary | ICD-10-CM | POA: Diagnosis not present

## 2021-11-14 DIAGNOSIS — E876 Hypokalemia: Secondary | ICD-10-CM | POA: Diagnosis not present

## 2021-11-14 DIAGNOSIS — I517 Cardiomegaly: Secondary | ICD-10-CM | POA: Diagnosis not present

## 2021-11-14 DIAGNOSIS — I10 Essential (primary) hypertension: Secondary | ICD-10-CM | POA: Diagnosis not present

## 2021-11-14 DIAGNOSIS — N1832 Chronic kidney disease, stage 3b: Secondary | ICD-10-CM | POA: Diagnosis not present

## 2021-11-14 DIAGNOSIS — R55 Syncope and collapse: Secondary | ICD-10-CM | POA: Diagnosis not present

## 2021-11-14 DIAGNOSIS — J9811 Atelectasis: Secondary | ICD-10-CM | POA: Diagnosis not present

## 2021-11-15 DIAGNOSIS — R55 Syncope and collapse: Secondary | ICD-10-CM | POA: Diagnosis not present

## 2021-11-26 ENCOUNTER — Ambulatory Visit (INDEPENDENT_AMBULATORY_CARE_PROVIDER_SITE_OTHER): Payer: Medicare Other | Admitting: Sports Medicine

## 2021-11-26 ENCOUNTER — Ambulatory Visit (INDEPENDENT_AMBULATORY_CARE_PROVIDER_SITE_OTHER): Payer: Medicare Other

## 2021-11-26 ENCOUNTER — Encounter: Payer: Self-pay | Admitting: Sports Medicine

## 2021-11-26 DIAGNOSIS — J4541 Moderate persistent asthma with (acute) exacerbation: Secondary | ICD-10-CM

## 2021-11-26 DIAGNOSIS — M65312 Trigger thumb, left thumb: Secondary | ICD-10-CM | POA: Diagnosis not present

## 2021-11-26 DIAGNOSIS — I10 Essential (primary) hypertension: Secondary | ICD-10-CM | POA: Diagnosis not present

## 2021-11-26 DIAGNOSIS — R7989 Other specified abnormal findings of blood chemistry: Secondary | ICD-10-CM | POA: Insufficient documentation

## 2021-11-26 DIAGNOSIS — R059 Cough, unspecified: Secondary | ICD-10-CM

## 2021-11-26 DIAGNOSIS — R778 Other specified abnormalities of plasma proteins: Secondary | ICD-10-CM

## 2021-11-26 MED ORDER — FLUTICASONE PROPIONATE 50 MCG/ACT NA SUSP: 2.0000 | Freq: Every day | NASAL | 3 refills | Status: DC

## 2021-11-26 MED ORDER — LORATADINE 10 MG PO TABS: 10.0000 mg | ORAL_TABLET | Freq: Every day | ORAL | 3 refills | Status: AC

## 2021-11-26 NOTE — Assessment & Plan Note (Signed)
Injection as above, trigger finger exercises. Return to see me in a month for this.

## 2021-11-26 NOTE — Progress Notes (Signed)
    Procedures performed today:    Procedure: Real-time Ultrasound Guided injection of the left flexor pollicis longus tendon sheath Device: Samsung HS60  Verbal informed consent obtained.  Time-out conducted.  Noted no overlying erythema, induration, or other signs of local infection.  Skin prepped in a sterile fashion.  Local anesthesia: Topical Ethyl chloride.  With sterile technique and under real time ultrasound guidance: Noted flexor tendon nodule, 1/2 cc lidocaine, 1/2 cc kenalog 40 injected easily. Completed without difficulty  Advised to call if fevers/chills, erythema, induration, drainage, or persistent bleeding.  Images permanently stored and available for review in PACS.  Impression: Technically successful ultrasound guided injection.  Independent interpretation of notes and tests performed by another provider:   None.  Brief History, Exam, Impression, and Recommendations:    Trigger thumb of left hand Injection as above, trigger finger exercises. Return to see me in a month for this.  Essential hypertension, benign Elevated here in the office but typically normalizes at home.  Elevated troponin Pleasant 88 year old male, history of mild aortic stenosis, hypertension, lipids have historically been well controlled, he is a non-smoker. He was recently hospitalized for an episode of syncope, he typically takes amlodipine a half tab twice a day, he took both tabs at once on an empty stomach and had an episode of syncope, no seizure-like activity, in the hospital history of mono levels were in the mid 60s, not extremely high but they did suggest troponin leak. Telemetry was normal, echo showed normal ejection fraction with only mild aortic stenosis. I think we did have a relatively good reason for his syncope, however considering his troponin levels, as well as his fatigue with activity I wonder if it is reasonable to either stress or cath him. I would like him to touch  base with his cardiologist to discuss this. This would at least help [place] to determine if future episodes of chest pain or shortness of breath are due to a pulmonary cause, has mild anemia likely due to myelodysplastic syndrome, or simply being out of shape.  I spent 30 minutes of total time managing this patient today, this includes chart review, face to face, and non-face to face time.,  This was separate from the time spent performing the injection.  ____________________________________________ Gwen Her. Dianah Field, M.D., ABFM., CAQSM., AME. Primary Care and Sports Medicine [facility]  Adjunct Professor of [place] [facility] of Medicine  Risk manager

## 2021-11-26 NOTE — Assessment & Plan Note (Addendum)
Pleasant 88 year old male, history of mild aortic stenosis, hypertension, lipids have historically been well controlled, he is a non-smoker. He was recently hospitalized for an episode of syncope, he typically takes amlodipine a half tab twice a day, he took both tabs at once on an empty stomach and had an episode of syncope, no seizure-like activity, in the hospital history of mono levels were in the mid 60s, not extremely high but they did suggest troponin leak. Telemetry was normal, echo showed normal ejection fraction with only mild aortic stenosis. I think we did have a relatively good reason for his syncope, however considering his troponin levels, as well as his fatigue with activity I wonder if it is reasonable to either stress or cath him. I would like him to touch base with his cardiologist to discuss this. This would at least help [place] to determine if future episodes of chest pain or shortness of breath are due to a pulmonary cause, has mild anemia likely due to myelodysplastic syndrome, or simply being out of shape.

## 2021-11-26 NOTE — Assessment & Plan Note (Signed)
Elevated here in the office but typically normalizes at home.

## 2021-11-27 ENCOUNTER — Other Ambulatory Visit: Payer: Self-pay | Admitting: Sports Medicine

## 2021-12-02 DIAGNOSIS — I129 Hypertensive chronic kidney disease with stage 1 through stage 4 chronic kidney disease, or unspecified chronic kidney disease: Secondary | ICD-10-CM | POA: Diagnosis not present

## 2021-12-02 DIAGNOSIS — R778 Other specified abnormalities of plasma proteins: Secondary | ICD-10-CM | POA: Diagnosis not present

## 2021-12-02 DIAGNOSIS — N1832 Chronic kidney disease, stage 3b: Secondary | ICD-10-CM | POA: Diagnosis not present

## 2021-12-02 DIAGNOSIS — R55 Syncope and collapse: Secondary | ICD-10-CM | POA: Diagnosis not present

## 2021-12-02 DIAGNOSIS — I10 Essential (primary) hypertension: Secondary | ICD-10-CM | POA: Diagnosis not present

## 2021-12-02 DIAGNOSIS — I350 Nonrheumatic aortic (valve) stenosis: Secondary | ICD-10-CM | POA: Diagnosis not present

## 2021-12-02 DIAGNOSIS — I498 Other specified cardiac arrhythmias: Secondary | ICD-10-CM | POA: Diagnosis not present

## 2021-12-04 ENCOUNTER — Ambulatory Visit: Payer: Medicare Other | Admitting: Sports Medicine

## 2021-12-04 DIAGNOSIS — D485 Neoplasm of uncertain behavior of skin: Secondary | ICD-10-CM | POA: Diagnosis not present

## 2021-12-04 DIAGNOSIS — Z85828 Personal history of other malignant neoplasm of skin: Secondary | ICD-10-CM | POA: Diagnosis not present

## 2021-12-04 DIAGNOSIS — D0462 Carcinoma in situ of skin of left upper limb, including shoulder: Secondary | ICD-10-CM | POA: Diagnosis not present

## 2021-12-04 DIAGNOSIS — D235 Other benign neoplasm of skin of trunk: Secondary | ICD-10-CM | POA: Diagnosis not present

## 2021-12-04 DIAGNOSIS — L905 Scar conditions and fibrosis of skin: Secondary | ICD-10-CM | POA: Diagnosis not present

## 2021-12-04 DIAGNOSIS — L821 Other seborrheic keratosis: Secondary | ICD-10-CM | POA: Diagnosis not present

## 2021-12-04 DIAGNOSIS — D0461 Carcinoma in situ of skin of right upper limb, including shoulder: Secondary | ICD-10-CM | POA: Diagnosis not present

## 2021-12-04 DIAGNOSIS — L814 Other melanin hyperpigmentation: Secondary | ICD-10-CM | POA: Diagnosis not present

## 2021-12-04 DIAGNOSIS — Z08 Encounter for follow-up examination after completed treatment for malignant neoplasm: Secondary | ICD-10-CM | POA: Diagnosis not present

## 2021-12-16 ENCOUNTER — Other Ambulatory Visit: Payer: Self-pay | Admitting: Sports Medicine

## 2021-12-16 DIAGNOSIS — J4541 Moderate persistent asthma with (acute) exacerbation: Secondary | ICD-10-CM

## 2021-12-24 ENCOUNTER — Ambulatory Visit (INDEPENDENT_AMBULATORY_CARE_PROVIDER_SITE_OTHER): Payer: Medicare Other | Admitting: Sports Medicine

## 2021-12-24 DIAGNOSIS — M65312 Trigger thumb, left thumb: Secondary | ICD-10-CM

## 2021-12-24 DIAGNOSIS — J4541 Moderate persistent asthma with (acute) exacerbation: Secondary | ICD-10-CM

## 2021-12-24 MED ORDER — TRELEGY ELLIPTA 200-62.5-25 MCG/ACT IN AEPB: 1.0000 | INHALATION_SPRAY | Freq: Every day | RESPIRATORY_TRACT | 3 refills | Status: DC

## 2021-12-24 NOTE — Progress Notes (Signed)
    Procedures performed today:    None.  Independent interpretation of notes and tests performed by another provider:   None.  Brief History, Exam, Impression, and Recommendations:    Trigger thumb of left hand Resolved after injection at the last visit.    ____________________________________________ Gwen Her. Dianah Field, M.D., ABFM., CAQSM., AME. Primary Care and Sports Medicine [facility]  Adjunct Professor of [place] [employer] [facility] of Medicine  Risk manager

## 2021-12-24 NOTE — Assessment & Plan Note (Signed)
Resolved after injection at the last visit 

## 2021-12-26 DIAGNOSIS — R55 Syncope and collapse: Secondary | ICD-10-CM | POA: Diagnosis not present

## 2021-12-27 DIAGNOSIS — I493 Ventricular premature depolarization: Secondary | ICD-10-CM | POA: Diagnosis not present

## 2021-12-27 DIAGNOSIS — R55 Syncope and collapse: Secondary | ICD-10-CM | POA: Diagnosis not present

## 2021-12-27 DIAGNOSIS — Z01810 Encounter for preprocedural cardiovascular examination: Secondary | ICD-10-CM | POA: Diagnosis not present

## 2021-12-27 DIAGNOSIS — R778 Other specified abnormalities of plasma proteins: Secondary | ICD-10-CM | POA: Diagnosis not present

## 2021-12-28 DIAGNOSIS — I493 Ventricular premature depolarization: Secondary | ICD-10-CM | POA: Diagnosis not present

## 2021-12-28 DIAGNOSIS — Z01810 Encounter for preprocedural cardiovascular examination: Secondary | ICD-10-CM | POA: Diagnosis not present

## 2022-01-01 DIAGNOSIS — I471 Supraventricular tachycardia: Secondary | ICD-10-CM | POA: Diagnosis not present

## 2022-01-01 DIAGNOSIS — I4729 Other ventricular tachycardia: Secondary | ICD-10-CM | POA: Diagnosis not present

## 2022-01-15 DIAGNOSIS — D0461 Carcinoma in situ of skin of right upper limb, including shoulder: Secondary | ICD-10-CM | POA: Diagnosis not present

## 2022-01-15 DIAGNOSIS — D0462 Carcinoma in situ of skin of left upper limb, including shoulder: Secondary | ICD-10-CM | POA: Diagnosis not present

## 2022-01-15 DIAGNOSIS — L570 Actinic keratosis: Secondary | ICD-10-CM | POA: Diagnosis not present

## 2022-01-15 DIAGNOSIS — X32XXXA Exposure to sunlight, initial encounter: Secondary | ICD-10-CM | POA: Diagnosis not present

## 2022-01-16 DIAGNOSIS — Z23 Encounter for immunization: Secondary | ICD-10-CM | POA: Diagnosis not present

## 2022-03-06 ENCOUNTER — Encounter: Payer: Self-pay | Admitting: Sports Medicine

## 2022-03-06 DIAGNOSIS — N401 Enlarged prostate with lower urinary tract symptoms: Secondary | ICD-10-CM

## 2022-03-10 MED ORDER — FINASTERIDE 5 MG PO TABS: 5.0000 mg | ORAL_TABLET | Freq: Every evening | ORAL | 3 refills | Status: DC

## 2022-04-09 ENCOUNTER — Other Ambulatory Visit: Payer: Self-pay | Admitting: Sports Medicine

## 2022-04-09 DIAGNOSIS — J4541 Moderate persistent asthma with (acute) exacerbation: Secondary | ICD-10-CM

## 2022-04-14 ENCOUNTER — Telehealth: Payer: Self-pay | Admitting: Sports Medicine

## 2022-04-14 DIAGNOSIS — I639 Cerebral infarction, unspecified: Secondary | ICD-10-CM

## 2022-04-14 DIAGNOSIS — R1310 Dysphagia, unspecified: Secondary | ICD-10-CM

## 2022-04-14 NOTE — Assessment & Plan Note (Signed)
Marcus Orr is an 89 year old male, he is status post lacunar stroke, since then he has noted some difficulty swallowing described more sensation of food sticking in the throat and less so aspiration. This has been going on for several years now. Has not noted a difference with thin liquids or solids. Differential is broad including oropharyngeal dyssynergy, aspiration, Zenker's diverticulum, esophageal web, esophageal dysmotility, we will proceed with modified barium swallow study, he currently is on thin liquids.

## 2022-04-15 NOTE — Telephone Encounter (Signed)
Marcus Orr is an 89 year old male, he is status post lacunar stroke, since then he has noted some difficulty swallowing described more sensation of food sticking in the throat and less so aspiration. This has been going on for several years now. Has not noted a difference with thin liquids or solids. Differential is broad including oropharyngeal dyssynergy, aspiration, Zenker's diverticulum, esophageal web, esophageal dysmotility, we will proceed with modified barium swallow study, he currently is on thin liquids.

## 2022-04-17 DIAGNOSIS — L814 Other melanin hyperpigmentation: Secondary | ICD-10-CM | POA: Diagnosis not present

## 2022-04-17 DIAGNOSIS — L821 Other seborrheic keratosis: Secondary | ICD-10-CM | POA: Diagnosis not present

## 2022-04-17 DIAGNOSIS — L304 Erythema intertrigo: Secondary | ICD-10-CM | POA: Diagnosis not present

## 2022-04-17 DIAGNOSIS — D485 Neoplasm of uncertain behavior of skin: Secondary | ICD-10-CM | POA: Diagnosis not present

## 2022-04-17 DIAGNOSIS — D0461 Carcinoma in situ of skin of right upper limb, including shoulder: Secondary | ICD-10-CM | POA: Diagnosis not present

## 2022-04-17 DIAGNOSIS — Z85828 Personal history of other malignant neoplasm of skin: Secondary | ICD-10-CM | POA: Diagnosis not present

## 2022-04-17 DIAGNOSIS — D0462 Carcinoma in situ of skin of left upper limb, including shoulder: Secondary | ICD-10-CM | POA: Diagnosis not present

## 2022-04-17 DIAGNOSIS — C44529 Squamous cell carcinoma of skin of other part of trunk: Secondary | ICD-10-CM | POA: Diagnosis not present

## 2022-04-17 DIAGNOSIS — L538 Other specified erythematous conditions: Secondary | ICD-10-CM | POA: Diagnosis not present

## 2022-04-17 DIAGNOSIS — Z08 Encounter for follow-up examination after completed treatment for malignant neoplasm: Secondary | ICD-10-CM | POA: Diagnosis not present

## 2022-04-17 DIAGNOSIS — D235 Other benign neoplasm of skin of trunk: Secondary | ICD-10-CM | POA: Diagnosis not present

## 2022-04-23 DIAGNOSIS — H524 Presbyopia: Secondary | ICD-10-CM | POA: Diagnosis not present

## 2022-04-23 DIAGNOSIS — H401132 Primary open-angle glaucoma, bilateral, moderate stage: Secondary | ICD-10-CM | POA: Diagnosis not present

## 2022-04-28 ENCOUNTER — Other Ambulatory Visit: Payer: Self-pay | Admitting: Sports Medicine

## 2022-05-13 DIAGNOSIS — D0461 Carcinoma in situ of skin of right upper limb, including shoulder: Secondary | ICD-10-CM | POA: Diagnosis not present

## 2022-05-13 DIAGNOSIS — L309 Dermatitis, unspecified: Secondary | ICD-10-CM | POA: Diagnosis not present

## 2022-05-13 DIAGNOSIS — D0462 Carcinoma in situ of skin of left upper limb, including shoulder: Secondary | ICD-10-CM | POA: Diagnosis not present

## 2022-05-13 DIAGNOSIS — L570 Actinic keratosis: Secondary | ICD-10-CM | POA: Diagnosis not present

## 2022-05-13 DIAGNOSIS — D045 Carcinoma in situ of skin of trunk: Secondary | ICD-10-CM | POA: Diagnosis not present

## 2022-05-13 DIAGNOSIS — C44529 Squamous cell carcinoma of skin of other part of trunk: Secondary | ICD-10-CM | POA: Diagnosis not present

## 2022-06-05 ENCOUNTER — Encounter: Payer: Self-pay | Admitting: Sports Medicine

## 2022-06-05 NOTE — Telephone Encounter (Signed)
He needs to be seen tomorrow, he can see anyone with an open slot.

## 2022-06-05 NOTE — Telephone Encounter (Signed)
Attempted to contact patient regarding scheduling an appointment. Phone keeps ringing. No answer. No option to leave a vm msg. No other number available in patient's chart.

## 2022-06-17 ENCOUNTER — Telehealth: Payer: Self-pay | Admitting: Sports Medicine

## 2022-06-17 MED ORDER — TERAZOSIN HCL 5 MG PO CAPS: 5.0000 mg | ORAL_CAPSULE | Freq: Every day | ORAL | 3 refills | Status: DC

## 2022-06-17 NOTE — Telephone Encounter (Signed)
Refilled and sent to [facility] 1 year supply

## 2022-06-17 NOTE — Telephone Encounter (Signed)
Pt sent a Mychart message requesting a refill of terazosin (HYTRIN) 5 MG capsule [id number] . Pt stated they have run out of this prescription.

## 2022-06-17 NOTE — Telephone Encounter (Signed)
Called pt unable to leave a voicemail.

## 2022-06-23 ENCOUNTER — Ambulatory Visit: Payer: Medicare Other | Admitting: Sports Medicine

## 2022-06-24 ENCOUNTER — Ambulatory Visit: Payer: Medicare Other | Admitting: Sports Medicine

## 2022-06-30 ENCOUNTER — Ambulatory Visit (INDEPENDENT_AMBULATORY_CARE_PROVIDER_SITE_OTHER): Payer: Medicare Other | Admitting: Sports Medicine

## 2022-06-30 DIAGNOSIS — H8110 Benign paroxysmal vertigo, unspecified ear: Secondary | ICD-10-CM | POA: Insufficient documentation

## 2022-06-30 DIAGNOSIS — E538 Deficiency of other specified B group vitamins: Secondary | ICD-10-CM | POA: Diagnosis not present

## 2022-06-30 DIAGNOSIS — N401 Enlarged prostate with lower urinary tract symptoms: Secondary | ICD-10-CM

## 2022-06-30 DIAGNOSIS — R739 Hyperglycemia, unspecified: Secondary | ICD-10-CM

## 2022-06-30 DIAGNOSIS — H8113 Benign paroxysmal vertigo, bilateral: Secondary | ICD-10-CM | POA: Diagnosis not present

## 2022-06-30 DIAGNOSIS — M65312 Trigger thumb, left thumb: Secondary | ICD-10-CM | POA: Diagnosis not present

## 2022-06-30 DIAGNOSIS — E785 Hyperlipidemia, unspecified: Secondary | ICD-10-CM

## 2022-06-30 DIAGNOSIS — E559 Vitamin D deficiency, unspecified: Secondary | ICD-10-CM | POA: Diagnosis not present

## 2022-06-30 DIAGNOSIS — E039 Hypothyroidism, unspecified: Secondary | ICD-10-CM

## 2022-06-30 MED ORDER — PREDNISONE 50 MG PO TABS: ORAL_TABLET | ORAL | 0 refills | Status: DC

## 2022-06-30 MED ORDER — AZITHROMYCIN 250 MG PO TABS: ORAL_TABLET | ORAL | 0 refills | Status: DC

## 2022-06-30 MED ORDER — MECLIZINE HCL 25 MG PO TABS: 25.0000 mg | ORAL_TABLET | Freq: Three times a day (TID) | ORAL | 3 refills | Status: AC | PRN

## 2022-06-30 MED ORDER — MECLIZINE HCL 25 MG PO TABS: 25.0000 mg | ORAL_TABLET | Freq: Three times a day (TID) | ORAL | 3 refills | Status: DC | PRN

## 2022-06-30 NOTE — Progress Notes (Signed)
    Procedures performed today:    None.  Independent interpretation of notes and tests performed by another provider:   None.  Brief History, Exam, Impression, and Recommendations:    Benign positional vertigo Pleasant 89 year old male, 3-week history of fullness in his ears and feeling off balance. The dizziness occurs usually when he changes position in his head. Head and neck exam is unrevealing, hearing is unchanged, suspect benign positional vertigo. I explained the anatomy and pathophysiology, we will do steroids, antibiotics, meclizine, vestibular rehabilitation. Return to see me in about 4 to 6 weeks.  Trigger thumb of left hand Trigger thumb continues to be resolved.  Hypothyroidism TSH elevated increasing levothyroxine to 88 mcg with a 6-week recheck.    ____________________________________________ Gwen Her. Dianah Field, M.D., ABFM., CAQSM., AME. Primary Care and Sports Medicine [facility]  Adjunct Professor of [place] [employer] [facility] of Medicine  Risk manager

## 2022-06-30 NOTE — Assessment & Plan Note (Signed)
Trigger thumb continues to be resolved.

## 2022-06-30 NOTE — Assessment & Plan Note (Signed)
Pleasant 89 year old male, 3-week history of fullness in his ears and feeling off balance. The dizziness occurs usually when he changes position in his head. Head and neck exam is unrevealing, hearing is unchanged, suspect benign positional vertigo. I explained the anatomy and pathophysiology, we will do steroids, antibiotics, meclizine, vestibular rehabilitation. Return to see me in about 4 to 6 weeks.

## 2022-06-30 NOTE — Addendum Note (Signed)
Addended by: Silverio Decamp on: 06/30/2022 02:51 PM   Modules accepted: Orders

## 2022-07-01 MED ORDER — LEVOTHYROXINE SODIUM 88 MCG PO TABS: 88.0000 ug | ORAL_TABLET | Freq: Every day | ORAL | 11 refills | Status: DC

## 2022-07-01 MED ORDER — VITAMIN D (ERGOCALCIFEROL) 1.25 MG (50000 UNIT) PO CAPS: 50000.0000 [IU] | ORAL_CAPSULE | ORAL | 0 refills | Status: DC

## 2022-07-01 NOTE — Assessment & Plan Note (Signed)
TSH elevated increasing levothyroxine to 88 mcg with a 6-week recheck.

## 2022-07-01 NOTE — Addendum Note (Signed)
Addended by: Silverio Decamp on: 07/01/2022 09:51 AM   Modules accepted: Orders

## 2022-07-02 LAB — VITAMIN D 25 HYDROXY (VIT D DEFICIENCY, FRACTURES): Vit D, 25-Hydroxy: 26 ng/mL — ABNORMAL LOW (ref 30–100)

## 2022-07-02 LAB — LIPID PANEL
Cholesterol: 134 mg/dL (ref <200)
HDL: 46 mg/dL (ref >=40)
LDL Cholesterol (Calc): 68 mg/dL (calc)
Non-HDL Cholesterol (Calc): 88 mg/dL (calc) (ref <130)
Total CHOL/HDL Ratio: 2.9 (calc) (ref <5.0)
Triglycerides: 112 mg/dL (ref <150)

## 2022-07-02 LAB — COMPREHENSIVE METABOLIC PANEL
AG Ratio: 1.5 (calc) (ref 1.0–2.5)
ALT: 15 U/L (ref 9–46)
AST: 20 U/L (ref 10–35)
Albumin: 4.4 g/dL (ref 3.6–5.1)
Alkaline phosphatase (APISO): 71 U/L (ref 35–144)
BUN/Creatinine Ratio: 16 (calc) (ref 6–22)
BUN: 25 mg/dL (ref 7–25)
CO2: 27 mmol/L (ref 20–32)
Calcium: 9.5 mg/dL (ref 8.6–10.3)
Chloride: 105 mmol/L (ref 98–110)
Creat: 1.55 mg/dL — ABNORMAL HIGH (ref 0.70–1.22)
Globulin: 3 g/dL (calc) (ref 1.9–3.7)
Glucose, Bld: 91 mg/dL (ref 65–99)
Potassium: 4.4 mmol/L (ref 3.5–5.3)
Sodium: 141 mmol/L (ref 135–146)
Total Bilirubin: 0.5 mg/dL (ref 0.2–1.2)
Total Protein: 7.4 g/dL (ref 6.1–8.1)

## 2022-07-02 LAB — TSH: TSH: 8.71 mIU/L — ABNORMAL HIGH (ref 0.40–4.50)

## 2022-07-02 LAB — CBC
HCT: 35.7 % — ABNORMAL LOW (ref 38.5–50.0)
Hemoglobin: 11.7 g/dL — ABNORMAL LOW (ref 13.2–17.1)
MCH: 28.9 pg (ref 27.0–33.0)
MCHC: 32.8 g/dL (ref 32.0–36.0)
MCV: 88.1 fL (ref 80.0–100.0)
MPV: 10.1 fL (ref 7.5–12.5)
Platelets: 301 10*3/uL (ref 140–400)
RBC: 4.05 10*6/uL — ABNORMAL LOW (ref 4.20–5.80)
RDW: 13.3 % (ref 11.0–15.0)
WBC: 6.8 10*3/uL (ref 3.8–10.8)

## 2022-07-02 LAB — HEMOGLOBIN A1C
Hgb A1c MFr Bld: 6 % of total Hgb — ABNORMAL HIGH (ref <5.7)
Mean Plasma Glucose: 126 mg/dL
eAG (mmol/L): 7 mmol/L

## 2022-07-02 LAB — VITAMIN B12: Vitamin B-12: 1302 pg/mL — ABNORMAL HIGH (ref 200–1100)

## 2022-07-02 LAB — PSA, TOTAL AND FREE
PSA, % Free: 38 % (calc) (ref >25)
PSA, Free: 1.1 ng/mL
PSA, Total: 2.9 ng/mL (ref <=4.0)

## 2022-07-03 ENCOUNTER — Encounter: Payer: Self-pay | Admitting: Sports Medicine

## 2022-07-07 DIAGNOSIS — I350 Nonrheumatic aortic (valve) stenosis: Secondary | ICD-10-CM | POA: Diagnosis not present

## 2022-07-14 ENCOUNTER — Encounter: Payer: Self-pay | Admitting: Rehabilitative and Restorative Service Providers"

## 2022-07-14 ENCOUNTER — Other Ambulatory Visit: Payer: Self-pay

## 2022-07-14 ENCOUNTER — Ambulatory Visit: Payer: Medicare Other | Attending: Sports Medicine | Admitting: Rehabilitative and Restorative Service Providers"

## 2022-07-14 DIAGNOSIS — R42 Dizziness and giddiness: Secondary | ICD-10-CM | POA: Insufficient documentation

## 2022-07-14 DIAGNOSIS — H8113 Benign paroxysmal vertigo, bilateral: Secondary | ICD-10-CM | POA: Insufficient documentation

## 2022-07-14 DIAGNOSIS — R2689 Other abnormalities of gait and mobility: Secondary | ICD-10-CM | POA: Diagnosis not present

## 2022-07-14 NOTE — Therapy (Signed)
OUTPATIENT PHYSICAL THERAPY VESTIBULAR EVALUATION   Patient Name: Marcus TALLY Sr. MRN: [id number] DOB:1933-12-03, 89 y.o., male Today's Date: 07/14/2022  END OF SESSION:  PT End of Session - 07/14/22 1501     Visit Number 1    Number of Visits 8    Date for PT Re-Evaluation 08/13/22    Authorization Type Medicare    PT Start Time [id number]    PT Stop Time 1532    PT Time Calculation (min) 34 min    Activity Tolerance Patient tolerated treatment well    Behavior During Therapy [facility] for tasks assessed/performed             Past Medical History:  Diagnosis Date   Aortic stenosis    mild AS 09/2015 echo   Aortic valve disease    mild   Asthma    as child   Basal ganglia hemorrhage    left basal ganlia intraparenchymal hemoorhage 06/17/16 (in the setting of HTN emergency)   Cataract 2009   Chronic kidney disease 2015   Colon cancer 10/10/2015   COPD (chronic obstructive pulmonary disease) 2018   GERD (gastroesophageal reflux disease)    Glaucoma 2010   Hyperlipidemia    Hypertension    Hypothyroidism    Iron deficiency anemia due to chronic blood loss 10/02/2015   Lower GI bleed 10/02/2015   Skin cancer    Stroke 2021   Thyroid disease    TIA (transient ischemic attack)    Past Surgical History:  Procedure Laterality Date   COLON RESECTION     ca   COLON SURGERY  2018   EYE SURGERY     JOINT REPLACEMENT     bil   REPLACEMENT TOTAL KNEE     TOTAL HIP ARTHROPLASTY Right 10/03/2016   TOTAL HIP ARTHROPLASTY Right 10/03/2016   Procedure: TOTAL HIP ARTHROPLASTY ANTERIOR APPROACH;  Surgeon: Dorna Leitz, MD;  Location: [place];  Service: Orthopedics;  Laterality: Right;   Patient Active Problem List   Diagnosis Date Noted   Benign positional vertigo 06/30/2022   Trigger thumb of left hand 11/26/2021   Elevated troponin 11/26/2021   Dysphagia 03/13/2021   Obstructive sleep apnea 01/13/2020   Bilateral hearing loss due to cerumen impaction 07/26/2019   History of  stroke 01/24/2019   Erectile dysfunction 04/19/2018   Male hypogonadism 11/16/2017   Status post total hip replacement, right 10/03/2016   Primary osteoarthritis of right hip 06/16/2016   Asthmatic bronchitis with bronchiectasis 06/16/2016   Colon cancer 10/10/2015   Iron deficiency anemia due to chronic blood loss 10/02/2015   Lower GI bleed 10/02/2015   Pulmonary nodules 10/02/2015   History of bilateral knee arthroplasty 03/06/2015   Actinic keratosis 01/13/2013   Renal insufficiency 07/14/2012   BPH (benign prostatic hyperplasia) 06/16/2012   Essential hypertension, benign 06/16/2012   Hyperlipidemia LDL goal <100 06/16/2012   Hypothyroidism 06/16/2012   Glaucoma 06/16/2012   Laryngopharyngeal reflux 06/16/2012   Perennial allergic rhinitis 06/16/2012   Annual physical exam 06/16/2012    PCP: Aundria Mems, MD REFERRING PROVIDER: Aundria Mems, MD REFERRING DIAG: H81.13 (ICD-10-CM) - Benign paroxysmal positional vertigo due to bilateral vestibular disorder   THERAPY DIAG:  Other abnormalities of gait and mobility  Dizziness and giddiness  ONSET DATE: 06/30/22  Rationale for Evaluation and Treatment: Rehabilitation  SUBJECTIVE:   SUBJECTIVE STATEMENT: The patient reports a recent h/o dizziness x months described as an unsteadiness when on his feet. He denies sensations of room spinning with  positional changes and notes imbalance when walking that is spontaneous in nature.  PERTINENT HISTORY:   PMH significant for HTN, mild aortic stenosis 3-week history of fullness in his ears and feeling off balance. The dizziness occurs usually when he changes position in his head. Head and neck exam is unrevealing, hearing is unchanged, suspect benign positional vertigo. I explained the anatomy and pathophysiology, we will do steroids, antibiotics, meclizine, vestibular rehabilitation.  PAIN:  Are you having pain? No  PRECAUTIONS: None  WEIGHT BEARING  RESTRICTIONS: No  FALLS: Has patient fallen in last 6 months? No  LIVING ENVIRONMENT: Lives with: lives with their spouse Lives in: House/apartment Stairs: No  PLOF: Independent  PATIENT GOALS: reduce dizziness  OBJECTIVE:   Cervical ROM:  Mild decrease with R cervical rotation as compared to the L side  STRENGTH: not tested at eval  VESTIBULAR ASSESSMENT:  GENERAL OBSERVATION: Walks independently into the clinic without a device   SYMPTOM BEHAVIOR:  Subjective history: Patient reports gradual onset of dizziness over the past few months  Non-Vestibular symptoms:  unsteadiness  Type of dizziness: Imbalance (Disequilibrium) and Unsteady with head/body turns  Frequency: daily   Duration: seconds  Aggravating factors: Spontaneous and Induced by motion: turning body quickly and turning head quickly  Relieving factors: head stationary  Progression of symptoms: unchanged  OCULOMOTOR EXAM:  Ocular Alignment: normal  Ocular ROM: No Limitations  Spontaneous Nystagmus: absent  Gaze-Induced Nystagmus: absent  Smooth Pursuits: intact  Saccades: intact   VESTIBULAR - OCULAR REFLEX:   Slow VOR: Normal provokes a mild sensation of dizziness  Head-Impulse Test: HIT Right: positive HIT Left: positive + bilat for mild refixation saccade to target    POSITIONAL TESTING: Right Dix-Hallpike: no nystagmus Left Dix-Hallpike: no nystagmus Right Roll Test: no nystagmus Left Roll Test: no nystagmus Right SL Left SL: no nystagmus   OTHOSTATICS:  Supine x 4 minutes  174/87, HR 50  Standing x 1 minute  125/83, HR 63 Standing x 3 minutes  143/84, HR=63  BALANCE: Steady standing eyes open x 30 seconds, eyes closed x 30 seconds (no significant sway noted, but does feel this replicates unsteady sensation) Foam standing eyes open x 30 seconds, eyes closed x 30 seconds with minimal sway  Single limb stance L side= 8 seconds, R side=1 second notes R groin pain (had R THR 2-3 years  ago)  [facility] Adult PT Treatment:                                                DATE: 07/14/22 Neuromuscular re-ed: Single leg standing near support surface for HEP Seated VOR x 1 viewing x 30 seconds at self regulated pace with minimal symptoms Self Care: Discussed starting with one home exercise and then waiting to see if there are any recommendations based on today's BP measures when he sees cardiology on Friday 07/18/22 Provided STEADI toolkit patient handout for postural hypotension and reviewed with the patient  PATIENT EDUCATION: Education details: HEP Person educated: Patient Education method: Consulting civil engineer, Media planner, and Handouts Education comprehension: verbalized understanding, returned demonstration, and needs further education  HOME EXERCISE PROGRAM: Access Code: [email or web address] URL: [email or web address] Date: 07/14/2022 Prepared by: Rudell Cobb Exercises - Single Leg Stance with Support  - 2 x daily - 7 x weekly - 1 sets - 3 reps - 10 seconds hold  GOALS: Goals reviewed with  patient? Yes LONG TERM GOALS: Target date: 08/11/22  The patient will be indep with HEP for balance and VOR. Baseline:  initiated at HEP Goal status: INITIAL  2.  The patient will subjectively report sensation of dizziness diminished by 50% since evaluation. Baseline:  Notes "lightheaded" sensation when on his feet Goal status: INITIAL  ASSESSMENT:  CLINICAL IMPRESSION: Patient is a 89 y.o. male who was seen today for physical therapy evaluation and treatment for vertigo. At today's evaluation, he presents with impairments in gaze adaptation with positive head impulse test bilaterally, mild unsteadiness with eyes closed on foam, dec'd single limb stance on the R LE (with groin discomfort), and a lightheaded sensation when on his feet. Due to subjective reports, PT also assessed orthostatics and patient had a 49 point drop on systolic reading when moving from supine to standing. PT  provided one HEP today and recommended he discuss with MD at next session (sees cardiology on 07/18/22). Plan to f/u in 2 weeks to progress HEP activities.  OBJECTIVE IMPAIRMENTS: decreased activity tolerance, decreased balance, difficulty walking, and dizziness.   ACTIVITY LIMITATIONS: locomotion level  PARTICIPATION LIMITATIONS: community activity  PERSONAL FACTORS: 3+ comorbidities: HTN, aortic stenosis, and h/o bilat hip replacements  are also affecting patient's functional outcome.   REHAB POTENTIAL: Good  CLINICAL DECISION MAKING: Stable/uncomplicated  EVALUATION COMPLEXITY: Low   PLAN:  PT FREQUENCY: 1x/week  PT DURATION: 4 weeks  PLANNED INTERVENTIONS: Therapeutic exercises, Therapeutic activity, Neuromuscular re-education, Balance training, Gait training, Patient/Family education, Self Care, Vestibular training, Canalith repositioning, and Manual therapy  PLAN FOR NEXT SESSION: Check if patient discussed orthostatic measurements with MD-- any recommendations? Progress HEP, check on symptoms and reassess as needed.   Connell, PT 07/14/2022, 4:25 PM

## 2022-07-18 DIAGNOSIS — I350 Nonrheumatic aortic (valve) stenosis: Secondary | ICD-10-CM | POA: Diagnosis not present

## 2022-07-18 DIAGNOSIS — E785 Hyperlipidemia, unspecified: Secondary | ICD-10-CM | POA: Diagnosis not present

## 2022-07-18 DIAGNOSIS — I10 Essential (primary) hypertension: Secondary | ICD-10-CM | POA: Diagnosis not present

## 2022-08-04 ENCOUNTER — Ambulatory Visit: Payer: Medicare Other | Admitting: Rehabilitative and Restorative Service Providers"

## 2022-08-11 ENCOUNTER — Ambulatory Visit: Payer: Medicare Other | Admitting: Sports Medicine

## 2022-08-14 ENCOUNTER — Ambulatory Visit: Payer: Medicare Other | Admitting: Sports Medicine

## 2022-08-15 ENCOUNTER — Encounter: Payer: Self-pay | Admitting: Sports Medicine

## 2022-08-15 ENCOUNTER — Ambulatory Visit (INDEPENDENT_AMBULATORY_CARE_PROVIDER_SITE_OTHER): Payer: Medicare Other | Admitting: Sports Medicine

## 2022-08-15 VITALS — BP 165/81 | HR 86

## 2022-08-15 DIAGNOSIS — I10 Essential (primary) hypertension: Secondary | ICD-10-CM

## 2022-08-15 DIAGNOSIS — E039 Hypothyroidism, unspecified: Secondary | ICD-10-CM | POA: Diagnosis not present

## 2022-08-15 NOTE — Progress Notes (Signed)
    Procedures performed today:    None.  Independent interpretation of notes and tests performed by another provider:   None.  Brief History, Exam, Impression, and Recommendations:    Essential hypertension, benign Typically elevated in the office but usually between 1 20-1 40 systolic at home, no changes in blood pressure medicine for now.  Hypothyroidism Increase levothyroxine to 88 mcg 6 weeks ago, rechecking TSH, T3, T4 today.    ____________________________________________ Ihor Austin. Benjamin Stain, M.D., ABFM., CAQSM., AME. Primary Care and Sports Medicine [facility]  Adjunct Professor of Family Medicine  [place] [employer] [facility] of Medicine  Restaurant manager, fast food

## 2022-08-15 NOTE — Assessment & Plan Note (Signed)
Typically elevated in the office but usually between 1 20-1 40 systolic at home, no changes in blood pressure medicine for now.

## 2022-08-15 NOTE — Assessment & Plan Note (Signed)
Increase levothyroxine to 88 mcg 6 weeks ago, rechecking TSH, T3, T4 today.

## 2022-08-16 LAB — T3, FREE: T3, Free: 2.4 pg/mL (ref 2.3–4.2)

## 2022-08-16 LAB — TSH: TSH: 3.35 mIU/L (ref 0.40–4.50)

## 2022-08-16 LAB — T4, FREE: Free T4: 1.3 ng/dL (ref 0.8–1.8)

## 2022-08-18 ENCOUNTER — Encounter: Payer: Self-pay | Admitting: Sports Medicine

## 2022-08-20 ENCOUNTER — Encounter: Payer: Self-pay | Admitting: Sports Medicine

## 2022-08-21 MED ORDER — OMEPRAZOLE 40 MG PO CPDR: 40.0000 mg | DELAYED_RELEASE_CAPSULE | Freq: Every day | ORAL | 3 refills | Status: DC

## 2022-08-26 ENCOUNTER — Ambulatory Visit (INDEPENDENT_AMBULATORY_CARE_PROVIDER_SITE_OTHER): Payer: Medicare Other | Admitting: Sports Medicine

## 2022-08-26 DIAGNOSIS — Z0000 Encounter for general adult medical examination without abnormal findings: Secondary | ICD-10-CM

## 2022-08-26 NOTE — Progress Notes (Signed)
MEDICARE ANNUAL WELLNESS VISIT  08/26/2022  Telephone Visit Disclaimer This Medicare AWV was conducted by telephone due to national recommendations for restrictions regarding the COVID-19 Pandemic (e.g. social distancing).  I verified, using two identifiers, that I am speaking with Marcus Clever Sr. or their authorized healthcare agent. I discussed the limitations, risks, security, and privacy concerns of performing an evaluation and management service by telephone and the potential availability of an in-person appointment in the future. The patient expressed understanding and agreed to proceed.  Location of Patient: Home Location of Provider (nurse):  In the office.  Subjective:    Marcus BECHEN Sr. is a 89 y.o. male patient of Thekkekandam, Marcus Austin, MD who had a Medicare Annual Wellness Visit today via telephone. [name] is Retired and lives with their spouse. he has 3 children. he reports that he is socially active and does interact with friends/family regularly. he is moderately physically active and enjoys travelling.  Patient Care Team: Monica Becton, MD as PCP - General (Family Medicine)     08/26/2022    3:58 PM 07/14/2022    4:25 PM 10/03/2016    5:46 PM 09/29/2016    3:04 PM 10/12/2015   10:21 AM 10/10/2015   11:16 AM 08/30/2013    3:21 PM  Advanced Directives  Does Patient Have a Medical Advance Directive? No Yes No No No No Patient does not have advance directive;Patient would like information  Would patient like information on creating a medical advance directive? No - Patient declined  No - Patient declined  No - patient declined information  Advance directive packet given    Hospital Utilization Over the Past 12 Months: # of hospitalizations or ER visits: 0 # of surgeries: 0  Review of Systems    Patient reports that his overall health is unchanged compared to last year.  History obtained from chart review and the patient  Patient Reported Readings (BP,  Pulse, CBG, Weight, etc) none  Pain Assessment Pain : No/denies pain     Current Medications & Allergies (verified) Allergies as of 08/26/2022       Reactions   Tamiflu [oseltamivir] Shortness Of Breath   Actual allergic reaction to Tamiflu        Medication List        Accurate as of Aug 26, 2022  4:06 PM. If you have any questions, ask your nurse or doctor.          STOP taking these medications    azithromycin 250 MG tablet Commonly known as: Zithromax Z-Pak   B-D 3CC LUER-LOK SYR 18GX1-1/2 18G X 1-1/2" 3 ML Misc Generic drug: SYRINGE-NEEDLE (DISP) 3 ML   NEEDLE (DISP) 22 G 22G X 1-1/2" Misc   predniSONE 50 MG tablet Commonly known as: DELTASONE       TAKE these medications    albuterol 108 (90 Base) MCG/ACT inhaler Commonly known as: VENTOLIN HFA Inhale 2 puffs into the lungs every 6 (six) hours as needed for wheezing.   amLODipine 5 MG tablet Commonly known as: NORVASC Take 1 tablet (5 mg total) by mouth in the morning and at bedtime.   Cosopt 2-0.5 % ophthalmic solution Generic drug: dorzolamide-timolol INSTILL 1 DROP INTO BOTH   EYES TWICE DAILY.   cyanocobalamin 1000 MCG tablet Commonly known as: VITAMIN B12 Take 1 tablet (1,000 mcg total) by mouth daily.   ferrous sulfate 325 (65 FE) MG EC tablet Take 1 tablet (325 mg total) by mouth 3 (  three) times daily with meals.   finasteride 5 MG tablet Commonly known as: PROSCAR Take 1 tablet (5 mg total) by mouth every evening.   fluticasone 50 MCG/ACT nasal spray Commonly known as: FLONASE Place 2 sprays into both nostrils daily.   Folic + B12 [phone] MCG Tabs Take 1 tablet by mouth daily.   hydrochlorothiazide 25 MG tablet Commonly known as: HYDRODIURIL Take 0.5 tablets (12.5 mg total) by mouth 2 (two) times daily.   lisinopril 40 MG tablet Commonly known as: ZESTRIL Take 0.5 tablets (20 mg total) by mouth in the morning and at bedtime.   loratadine 10 MG tablet Commonly known as:  CLARITIN Take 1 tablet (10 mg total) by mouth daily.   meclizine 25 MG tablet Commonly known as: ANTIVERT Take 1 tablet (25 mg total) by mouth 3 (three) times daily as needed for dizziness or nausea.   montelukast 10 MG tablet Commonly known as: SINGULAIR TAKE 1 TABLET BY MOUTH EVERYDAY AT BEDTIME   nystatin 100000 UNIT/ML suspension Commonly known as: MYCOSTATIN Take 5 mLs (500,000 Units total) by mouth 4 (four) times daily. Swish for 30 seconds and spit out.   omeprazole 40 MG capsule Commonly known as: PRILOSEC Take 1 capsule (40 mg total) by mouth at bedtime.   simvastatin 40 MG tablet Commonly known as: ZOCOR TAKE 1 TABLET EVERY EVENING   Synthroid 75 MCG tablet Generic drug: levothyroxine Take 75 mcg by mouth daily. What changed: Another medication with the same name was removed. Continue taking this medication, and follow the directions you see here.   tadalafil 10 MG tablet Commonly known as: Cialis Take 1 tablet (10 mg total) by mouth daily as needed.   terazosin 5 MG capsule Commonly known as: HYTRIN Take 1 capsule (5 mg total) by mouth at bedtime.   Trelegy Ellipta 200-62.5-25 MCG/ACT Aepb Generic drug: Fluticasone-Umeclidin-Vilant Inhale 1 puff into the lungs daily.   triamcinolone cream 0.1 % Commonly known as: KENALOG Apply topically 2 (two) times daily.   valACYclovir 1000 MG tablet Commonly known as: VALTREX Take 1,000 mg by mouth 3 (three) times daily.   Vitamin D (Ergocalciferol) 1.25 MG (50000 UNIT) Caps capsule Commonly known as: DRISDOL Take 1 capsule (50,000 Units total) by mouth every 7 (seven) days. Take for 8 total doses(weeks)        History (reviewed): Past Medical History:  Diagnosis Date   Aortic stenosis    mild AS 09/2015 echo   Aortic valve disease    mild   Asthma    as child   Basal ganglia hemorrhage (HCC)    left basal ganlia intraparenchymal hemoorhage 06/17/16 (in the setting of HTN emergency)   Cataract 2009    Chronic kidney disease 2015   Colon cancer (HCC) 10/10/2015   COPD (chronic obstructive pulmonary disease) (HCC) 2018   GERD (gastroesophageal reflux disease)    Glaucoma 2010   Hyperlipidemia    Hypertension    Hypothyroidism    Iron deficiency anemia due to chronic blood loss 10/02/2015   Lower GI bleed 10/02/2015   Skin cancer    Stroke [facility]) 2021   Thyroid disease    TIA (transient ischemic attack)    Past Surgical History:  Procedure Laterality Date   COLON RESECTION     ca   COLON SURGERY  2018   EYE SURGERY     JOINT REPLACEMENT     bil   REPLACEMENT TOTAL KNEE     TOTAL HIP ARTHROPLASTY Right 10/03/2016  TOTAL HIP ARTHROPLASTY Right 10/03/2016   Procedure: TOTAL HIP ARTHROPLASTY ANTERIOR APPROACH;  Surgeon: Jodi Geralds, MD;  Location: [facility] OR;  Service: Orthopedics;  Laterality: Right;   History reviewed. No pertinent family history. Social History   Socioeconomic History   Marital status: Married    Spouse name: Dois Davenport   Number of children: 3   Years of education: 16   Highest education level: Some college, no degree  Occupational History   Occupation: Retired  Tobacco Use   Smoking status: Never   Smokeless tobacco: Never  Vaping Use   Vaping Use: Never used  Substance and Sexual Activity   Alcohol use: Never   Drug use: Never   Sexual activity: Yes  Other Topics Concern   Not on file  Social History Narrative   Lives with spouse. He has three children. He enjoys travelling.   Social Determinants of Health   Financial Resource Strain: Low Risk  (08/25/2022)   Overall Financial Resource Strain (CARDIA)    Difficulty of Paying Living Expenses: Not hard at all  Food Insecurity: No Food Insecurity (08/25/2022)   Hunger Vital Sign    Worried About Running Out of Food in the Last Year: Never true    Ran Out of Food in the Last Year: Never true  Transportation Needs: No Transportation Needs (08/25/2022)   PRAPARE - Scientist, research (physical sciences) (Medical): No    Lack of Transportation (Non-Medical): No  Physical Activity: Sufficiently Active (08/26/2022)   Exercise Vital Sign    Days of Exercise per Week: 7 days    Minutes of Exercise per Session: 30 min  Stress: No Stress Concern Present (08/25/2022)   [employer] of Occupational Health - Occupational Stress Questionnaire    Feeling of Stress : Not at all  Social Connections: Moderately Integrated (08/26/2022)   Social Connection and Isolation Panel [NHANES]    Frequency of Communication with Friends and Family: More than three times a week    Frequency of Social Gatherings with Friends and Family: Once a week    Attends Religious Services: More than 4 times per year    Active Member of [employer] or Organizations: No    Attends Banker Meetings: Never    Marital Status: Married    Activities of Daily Living    08/25/2022    3:22 PM  In your present state of health, do you have any difficulty performing the following activities:  Hearing? 0  Vision? 0  Difficulty concentrating or making decisions? 0  Walking or climbing stairs? 0  Dressing or bathing? 0  Doing errands, shopping? 0  Preparing Food and eating ? N  Using the Toilet? N  In the past six months, have you accidently leaked urine? N  Do you have problems with loss of bowel control? N  Managing your Medications? N  Managing your Finances? N  Housekeeping or managing your Housekeeping? N    Patient Education/ Literacy How often do you need to have someone help you when you read instructions, pamphlets, or other written materials from your doctor or pharmacy?: 1 - Never What is the last grade level you completed in school?: Masters degree  Exercise Current Exercise Habits: Home exercise routine, Type of exercise: stretching, Time (Minutes): 30, Frequency (Times/Week): 7, Weekly Exercise (Minutes/Week): 210, Intensity: Moderate, Exercise limited by: orthopedic  condition(s)  Diet Patient reports consuming  4-5 small  meals a day and 0 snack(s) a day Patient reports that his  primary diet is: Regular Patient reports that she does have regular access to food.   Depression Screen    08/26/2022    3:58 PM 08/15/2022    1:59 PM 07/31/2021   11:07 AM 03/13/2021    2:28 PM 10/19/2020    2:43 PM 11/02/2017   11:27 AM 01/04/2015    2:33 PM  PHQ 2/9 Scores  PHQ - 2 Score 0 0 0 0 0 0 0  PHQ- 9 Score      3      Fall Risk    08/26/2022    3:58 PM 08/25/2022    3:22 PM 08/15/2022    1:59 PM 07/31/2021   11:07 AM 07/26/2021   11:57 AM  Fall Risk   Falls in the past year? 0 0 0 0 0  Number falls in past yr: 0  0 0   Injury with Fall? 0  0 0   Risk for fall due to : Impaired mobility   No Fall Risks   Follow up Falls evaluation completed  Falls evaluation completed Falls evaluation completed      Objective:  Marcus Clever Sr. seemed alert and oriented and he participated appropriately during our telephone visit.  Blood Pressure Weight BMI  BP Readings from Last 3 Encounters:  08/15/22 (!) 165/81  11/26/21 130/78  08/28/21 130/80   Wt Readings from Last 3 Encounters:  11/26/21 175 lb (79.4 kg)  08/28/21 176 lb (79.8 kg)  08/21/21 175 lb (79.4 kg)   BMI Readings from Last 1 Encounters:  11/26/21 25.11 kg/m    *Unable to obtain current vital signs, weight, and BMI due to telephone visit type  Hearing/Vision  Elizer did not seem to have difficulty with hearing/understanding during the telephone conversation Reports that he has had a formal eye exam by an eye care professional within the past year Reports that he has not had a formal hearing evaluation within the past year *Unable to fully assess hearing and vision during telephone visit type  Cognitive Function:    08/26/2022    4:01 PM 07/31/2021   11:11 AM  6CIT Screen  What Year? 0 points 0 points  What month? 3 points 0 points  What time? 0 points 0 points  Count back from 20 0 points  0 points  Months in reverse 0 points 0 points  Repeat phrase 0 points 2 points  Total Score 3 points 2 points   (Normal:0-7, Significant for Dysfunction: >8)  Normal Cognitive Function Screening: Yes   Immunization & Health Maintenance Record Immunization History  Administered Date(s) Administered   Fluad Quad(high Dose 65+) 01/24/2019, 01/13/2020, 01/16/2022   Influenza Whole 04/15/2011   Influenza, High Dose Seasonal PF 01/01/2017, 01/24/2018, 02/22/2021   Influenza,inj,Quad PF,6+ Mos 01/13/2013, 01/13/2013, 01/04/2015, 01/04/2015   Influenza-Unspecified 01/24/2019, 01/13/2020   PFIZER Comirnaty(Gray Top)Covid-19 Tri-Sucrose Vaccine 01/16/2022   PFIZER(Purple Top)SARS-COV-2 Vaccination 05/12/2019, 06/07/2019   Pfizer Covid-19 Vaccine Bivalent Booster 2yrs & up 02/15/2021   Pneumococcal Conjugate-13 01/04/2015   Pneumococcal Polysaccharide-23 06/20/2016   Pneumococcal-Unspecified 06/20/2016   Td 06/07/2020   Td (Adult), 2 Lf Tetanus Toxid, Preservative Free 06/07/2020   Td (Adult),5 Lf Tetanus Toxid, Preservative Free 06/07/2020   Tdap 06/07/2020   Zoster Recombinat (Shingrix) 01/30/2022    Health Maintenance  Topic Date Due   COVID-19 Vaccine (5 - 2023-24 season) 09/11/2022 (Originally 03/13/2022)   Zoster Vaccines- Shingrix (2 of 2) 11/26/2022 (Originally 03/27/2022)   INFLUENZA VACCINE  11/13/2022   Medicare Annual  Wellness (AWV)  08/26/2023   DTaP/Tdap/Td (4 - Td or Tdap) 06/07/2030   Pneumonia Vaccine 26+ Years old  Completed   HPV VACCINES  Aged Out       Assessment  This is a routine wellness examination for [employer] OrrMarland Kitchen  Health Maintenance: Due or Overdue There are no preventive care reminders to display for this patient.   Marcus Clever Sr. does not need a referral for Community Assistance: Care Management:   no Social Work:    no Prescription Assistance:  no Nutrition/Diabetes Education:  no   Plan:  Personalized Goals  Goals Addressed                This Visit's Progress     Patient Stated (pt-stated)        Patient stated that he would like to be able to walk better.       Personalized Health Maintenance & Screening Recommendations  Shingles vaccine- 2nd dose  Lung Cancer Screening Recommended: no (Low Dose CT Chest recommended if Age 53-80 years, 30 pack-year currently smoking OR have quit w/in past 15 years) Hepatitis C Screening recommended: no HIV Screening recommended: no  Advanced Directives: Written information was not prepared per patient's request.  Referrals & Orders No orders of the defined types were placed in this encounter.   Follow-up Plan Follow-up with Monica Becton, MD as planned Schedule 2nd dose of shingles vaccine at the pharmacy. Medicare wellness visit in one year.  Patient will access AVS on my chart.   I have personally reviewed and noted the following in the patient's chart:   Medical and social history Use of alcohol, tobacco or illicit drugs  Current medications and supplements Functional ability and status Nutritional status Physical activity Advanced directives List of other physicians Hospitalizations, surgeries, and ER visits in previous 12 months Vitals Screenings to include cognitive, depression, and falls Referrals and appointments  In addition, I have reviewed and discussed with Marcus Clever Sr. certain preventive protocols, quality metrics, and best practice recommendations. A written personalized care plan for preventive services as well as general preventive health recommendations is available and can be mailed to the patient at his request.      Modesto Charon, RN BSN  08/26/2022

## 2022-08-26 NOTE — Patient Instructions (Addendum)
MEDICARE ANNUAL WELLNESS VISIT Health Maintenance Summary and Written Plan of Care  [name] ,  Thank you for allowing me to perform your Medicare Annual Wellness Visit and for your ongoing commitment to your health.   Health Maintenance & Immunization History Health Maintenance  Topic Date Due   COVID-19 Vaccine (5 - 2023-24 season) 09/11/2022 (Originally 03/13/2022)   Zoster Vaccines- Shingrix (2 of 2) 11/26/2022 (Originally 03/27/2022)   INFLUENZA VACCINE  11/13/2022   Medicare Annual Wellness (AWV)  08/26/2023   DTaP/Tdap/Td (4 - Td or Tdap) 06/07/2030   Pneumonia Vaccine 18+ Years old  Completed   HPV VACCINES  Aged Out   Immunization History  Administered Date(s) Administered   Fluad Quad(high Dose 65+) 01/24/2019, 01/13/2020, 01/16/2022   Influenza Whole 04/15/2011   Influenza, High Dose Seasonal PF 01/01/2017, 01/24/2018, 02/22/2021   Influenza,inj,Quad PF,6+ Mos 01/13/2013, 01/13/2013, 01/04/2015, 01/04/2015   Influenza-Unspecified 01/24/2019, 01/13/2020   PFIZER Comirnaty(Gray Top)Covid-19 Tri-Sucrose Vaccine 01/16/2022   PFIZER(Purple Top)SARS-COV-2 Vaccination 05/12/2019, 06/07/2019   Pfizer Covid-19 Vaccine Bivalent Booster 83yrs & up 02/15/2021   Pneumococcal Conjugate-13 01/04/2015   Pneumococcal Polysaccharide-23 06/20/2016   Pneumococcal-Unspecified 06/20/2016   Td 06/07/2020   Td (Adult), 2 Lf Tetanus Toxid, Preservative Free 06/07/2020   Td (Adult),5 Lf Tetanus Toxid, Preservative Free 06/07/2020   Tdap 06/07/2020   Zoster Recombinat (Shingrix) 01/30/2022    These are the patient goals that we discussed:  Goals Addressed               This Visit's Progress     Patient Stated (pt-stated)        Patient stated that he would like to be able to walk better.         This is a list of Health Maintenance Items that are overdue or due now: Shingles vaccine- 2nd dose    Orders/Referrals Placed Today: No orders of the defined types were placed  in this encounter.  (Contact our referral department at [phone] if you have not spoken with someone about your referral appointment within the next 5 days)    Follow-up Plan Follow-up with Marcus Becton, MD as planned Schedule 2nd dose of shingles vaccine at the pharmacy. Medicare wellness visit in one year.  Patient will access AVS on my chart.      Health Maintenance, Male Adopting a healthy lifestyle and getting preventive care are important in promoting health and wellness. Ask your health care provider about: The right schedule for you to have regular tests and exams. Things you can do on your own to prevent diseases and keep yourself healthy. What should I know about diet, weight, and exercise? Eat a healthy diet  Eat a diet that includes plenty of vegetables, fruits, low-fat dairy products, and lean protein. Do not eat a lot of foods that are high in solid fats, added sugars, or sodium. Maintain a healthy weight Body mass index (BMI) is a measurement that can be used to identify possible weight problems. It estimates body fat based on height and weight. Your health care provider can help determine your BMI and help you achieve or maintain a healthy weight. Get regular exercise Get regular exercise. This is one of the most important things you can do for your health. Most adults should: Exercise for at least 150 minutes each week. The exercise should increase your heart rate and make you sweat (moderate-intensity exercise). Do strengthening exercises at least twice a week. This is in addition to the moderate-intensity exercise. Spend  less time sitting. Even light physical activity can be beneficial. Watch cholesterol and blood lipids Have your blood tested for lipids and cholesterol at 87 years of age, then have this test every 5 years. You may need to have your cholesterol levels checked more often if: Your lipid or cholesterol levels are high. You are older  than 87 years of age. You are at high risk for heart disease. What should I know about cancer screening? Many types of cancers can be detected early and may often be prevented. Depending on your health history and family history, you may need to have cancer screening at various ages. This may include screening for: Colorectal cancer. Prostate cancer. Skin cancer. Lung cancer. What should I know about heart disease, diabetes, and high blood pressure? Blood pressure and heart disease High blood pressure causes heart disease and increases the risk of stroke. This is more likely to develop in people who have high blood pressure readings or are overweight. Talk with your health care provider about your target blood pressure readings. Have your blood pressure checked: Every 3-5 years if you are 22-69 years of age. Every year if you are 89 years old or older. If you are between the ages of 79 and 70 and are a current or former smoker, ask your health care provider if you should have a one-time screening for abdominal aortic aneurysm (AAA). Diabetes Have regular diabetes screenings. This checks your fasting blood sugar level. Have the screening done: Once every three years after age 23 if you are at a normal weight and have a low risk for diabetes. More often and at a younger age if you are overweight or have a high risk for diabetes. What should I know about preventing infection? Hepatitis B If you have a higher risk for hepatitis B, you should be screened for this virus. Talk with your health care provider to find out if you are at risk for hepatitis B infection. Hepatitis C Blood testing is recommended for: Everyone born from [birth year] through 1963. Anyone with known risk factors for hepatitis C. Sexually transmitted infections (STIs) You should be screened each year for STIs, including gonorrhea and chlamydia, if: You are sexually active and are younger than 87 years of age. You are older than  87 years of age and your health care provider tells you that you are at risk for this type of infection. Your sexual activity has changed since you were last screened, and you are at increased risk for chlamydia or gonorrhea. Ask your health care provider if you are at risk. Ask your health care provider about whether you are at high risk for HIV. Your health care provider may recommend a prescription medicine to help prevent HIV infection. If you choose to take medicine to prevent HIV, you should first get tested for HIV. You should then be tested every 3 months for as long as you are taking the medicine. Follow these instructions at home: Alcohol use Do not drink alcohol if your health care provider tells you not to drink. If you drink alcohol: Limit how much you have to 0-2 drinks a day. Know how much alcohol is in your drink. In the [place] one drink equals one 12 oz bottle of beer (355 mL), one 5 oz glass of wine (148 mL), or one 1 oz glass of hard liquor (44 mL). Lifestyle Do not use any products that contain nicotine or tobacco. These products include cigarettes, chewing tobacco, and vaping devices, such  as e-cigarettes. If you need help quitting, ask your health care provider. Do not use street drugs. Do not share needles. Ask your health care provider for help if you need support or information about quitting drugs. General instructions Schedule regular health, dental, and eye exams. Stay current with your vaccines. Tell your health care provider if: You often feel depressed. You have ever been abused or do not feel safe at home. Summary Adopting a healthy lifestyle and getting preventive care are important in promoting health and wellness. Follow your health care provider's instructions about healthy diet, exercising, and getting tested or screened for diseases. Follow your health care provider's instructions on monitoring your cholesterol and blood pressure. This information is not  intended to replace advice given to you by your health care provider. Make sure you discuss any questions you have with your health care provider. Document Revised: 08/20/2020 Document Reviewed: 08/20/2020 Elsevier Patient Education  2023 [employer].

## 2022-09-02 ENCOUNTER — Encounter: Payer: Self-pay | Admitting: Sports Medicine

## 2022-09-02 MED ORDER — SIMVASTATIN 40 MG PO TABS: 40.0000 mg | ORAL_TABLET | Freq: Every evening | ORAL | 3 refills | Status: DC

## 2022-10-22 DIAGNOSIS — L821 Other seborrheic keratosis: Secondary | ICD-10-CM | POA: Diagnosis not present

## 2022-10-22 DIAGNOSIS — D0462 Carcinoma in situ of skin of left upper limb, including shoulder: Secondary | ICD-10-CM | POA: Diagnosis not present

## 2022-10-22 DIAGNOSIS — D045 Carcinoma in situ of skin of trunk: Secondary | ICD-10-CM | POA: Diagnosis not present

## 2022-10-22 DIAGNOSIS — D485 Neoplasm of uncertain behavior of skin: Secondary | ICD-10-CM | POA: Diagnosis not present

## 2022-10-22 DIAGNOSIS — L814 Other melanin hyperpigmentation: Secondary | ICD-10-CM | POA: Diagnosis not present

## 2022-10-22 DIAGNOSIS — D235 Other benign neoplasm of skin of trunk: Secondary | ICD-10-CM | POA: Diagnosis not present

## 2022-10-22 DIAGNOSIS — L538 Other specified erythematous conditions: Secondary | ICD-10-CM | POA: Diagnosis not present

## 2022-10-22 DIAGNOSIS — Z85828 Personal history of other malignant neoplasm of skin: Secondary | ICD-10-CM | POA: Diagnosis not present

## 2022-10-22 DIAGNOSIS — C44529 Squamous cell carcinoma of skin of other part of trunk: Secondary | ICD-10-CM | POA: Diagnosis not present

## 2022-10-22 DIAGNOSIS — Z08 Encounter for follow-up examination after completed treatment for malignant neoplasm: Secondary | ICD-10-CM | POA: Diagnosis not present

## 2022-10-22 DIAGNOSIS — L820 Inflamed seborrheic keratosis: Secondary | ICD-10-CM | POA: Diagnosis not present

## 2022-10-31 DIAGNOSIS — H0102B Squamous blepharitis left eye, upper and lower eyelids: Secondary | ICD-10-CM | POA: Diagnosis not present

## 2022-10-31 DIAGNOSIS — H401132 Primary open-angle glaucoma, bilateral, moderate stage: Secondary | ICD-10-CM | POA: Diagnosis not present

## 2022-10-31 DIAGNOSIS — H0102A Squamous blepharitis right eye, upper and lower eyelids: Secondary | ICD-10-CM | POA: Diagnosis not present

## 2022-11-10 DIAGNOSIS — C44529 Squamous cell carcinoma of skin of other part of trunk: Secondary | ICD-10-CM | POA: Diagnosis not present

## 2022-11-10 DIAGNOSIS — D045 Carcinoma in situ of skin of trunk: Secondary | ICD-10-CM | POA: Diagnosis not present

## 2022-11-10 DIAGNOSIS — D485 Neoplasm of uncertain behavior of skin: Secondary | ICD-10-CM | POA: Diagnosis not present

## 2022-11-10 DIAGNOSIS — D0462 Carcinoma in situ of skin of left upper limb, including shoulder: Secondary | ICD-10-CM | POA: Diagnosis not present

## 2022-11-10 DIAGNOSIS — L820 Inflamed seborrheic keratosis: Secondary | ICD-10-CM | POA: Diagnosis not present

## 2022-11-10 DIAGNOSIS — L538 Other specified erythematous conditions: Secondary | ICD-10-CM | POA: Diagnosis not present

## 2023-01-01 ENCOUNTER — Other Ambulatory Visit: Payer: Self-pay | Admitting: Sports Medicine

## 2023-01-01 DIAGNOSIS — J4541 Moderate persistent asthma with (acute) exacerbation: Secondary | ICD-10-CM

## 2023-01-20 ENCOUNTER — Ambulatory Visit: Payer: Medicare Other

## 2023-01-20 ENCOUNTER — Encounter: Payer: Self-pay | Admitting: Sports Medicine

## 2023-01-20 ENCOUNTER — Ambulatory Visit (INDEPENDENT_AMBULATORY_CARE_PROVIDER_SITE_OTHER): Payer: Medicare Other | Admitting: Sports Medicine

## 2023-01-20 VITALS — BP 166/82 | HR 67 | Temp 97.8°F

## 2023-01-20 DIAGNOSIS — K922 Gastrointestinal hemorrhage, unspecified: Secondary | ICD-10-CM | POA: Diagnosis not present

## 2023-01-20 DIAGNOSIS — J4541 Moderate persistent asthma with (acute) exacerbation: Secondary | ICD-10-CM

## 2023-01-20 DIAGNOSIS — M7541 Impingement syndrome of right shoulder: Secondary | ICD-10-CM

## 2023-01-20 DIAGNOSIS — M25511 Pain in right shoulder: Secondary | ICD-10-CM | POA: Diagnosis not present

## 2023-01-20 DIAGNOSIS — I700 Atherosclerosis of aorta: Secondary | ICD-10-CM | POA: Diagnosis not present

## 2023-01-20 DIAGNOSIS — M19011 Primary osteoarthritis, right shoulder: Secondary | ICD-10-CM | POA: Diagnosis not present

## 2023-01-20 MED ORDER — PREDNISONE 50 MG PO TABS: ORAL_TABLET | ORAL | 0 refills | Status: DC

## 2023-01-20 MED ORDER — AZITHROMYCIN 250 MG PO TABS: ORAL_TABLET | ORAL | 0 refills | Status: DC

## 2023-01-20 NOTE — Assessment & Plan Note (Signed)
Pain right shoulder with impingement signs, I discussed the anatomy, pathophysiology and force coupling functions of the rotator cuff, we will get x-rays, home PT, return to see me in 6 weeks, injection if not better.

## 2023-01-20 NOTE — Progress Notes (Addendum)
    Procedures performed today:    None.  Independent interpretation of notes and tests performed by another provider:   None.  Brief History, Exam, Impression, and Recommendations:    Asthmatic bronchitis with bronchiectasis Pleasant 89 year old male, he has asthmatic bronchitis with bronchiectasis on CT scans, Trelegy historically has done well, he does have occasional albuterol, he went to [place], 2 weeks ago and since then has had some chest tightness and productive cough. On exam he has bronchitic sounds but is moving good air, no accessory muscle use or signs of respiratory distress. Adding a chest x-ray, azithromycin, prednisone.  Update:  CXR does confirm pneumonia.  Will definitely need a followup chest xray to ensure it clears  Impingement syndrome, shoulder, right Pain right shoulder with impingement signs, I discussed the anatomy, pathophysiology and force coupling functions of the rotator cuff, we will get x-rays, home PT, return to see me in 6 weeks, injection if not better.    ____________________________________________ Ihor Austin. Benjamin Stain, M.D., ABFM., CAQSM., AME. Primary Care and Sports Medicine [facility]  Adjunct Professor of Family Medicine  [employer] [facility] [employer]  Restaurant manager, fast food

## 2023-01-20 NOTE — Assessment & Plan Note (Addendum)
Pleasant 89 year old male, he has asthmatic bronchitis with bronchiectasis on CT scans, Trelegy historically has done well, he does have occasional albuterol, he went to [place], 2 weeks ago and since then has had some chest tightness and productive cough. On exam he has bronchitic sounds but is moving good air, no accessory muscle use or signs of respiratory distress. Adding a chest x-ray, azithromycin, prednisone.  Update:  CXR does confirm pneumonia.  Will definitely need a followup chest xray to ensure it clears

## 2023-01-21 LAB — CBC WITH DIFFERENTIAL/PLATELET
Basophils Absolute: 0.1 10*3/uL (ref 0.0–0.2)
Basos: 1 %
EOS (ABSOLUTE): 0.9 10*3/uL — ABNORMAL HIGH (ref 0.0–0.4)
Eos: 10 %
Hematocrit: 34.8 % — ABNORMAL LOW (ref 37.5–51.0)
Hemoglobin: 11.4 g/dL — ABNORMAL LOW (ref 13.0–17.7)
Immature Grans (Abs): 0 10*3/uL (ref 0.0–0.1)
Immature Granulocytes: 0 %
Lymphocytes Absolute: 1.3 10*3/uL (ref 0.7–3.1)
Lymphs: 15 %
MCH: 29.6 pg (ref 26.6–33.0)
MCHC: 32.8 g/dL (ref 31.5–35.7)
MCV: 90 fL (ref 79–97)
Monocytes Absolute: 0.8 10*3/uL (ref 0.1–0.9)
Monocytes: 9 %
Neutrophils Absolute: 5.7 10*3/uL (ref 1.4–7.0)
Neutrophils: 65 %
Platelets: 350 10*3/uL (ref 150–450)
RBC: 3.85 x10E6/uL — ABNORMAL LOW (ref 4.14–5.80)
RDW: 13.2 % (ref 11.6–15.4)
WBC: 8.8 10*3/uL (ref 3.4–10.8)

## 2023-01-21 LAB — COMPREHENSIVE METABOLIC PANEL
ALT: 14 [IU]/L (ref 0–44)
AST: 19 [IU]/L (ref 0–40)
Albumin: 3.9 g/dL (ref 3.7–4.7)
Alkaline Phosphatase: 88 [IU]/L (ref 44–121)
BUN/Creatinine Ratio: 15 (ref 10–24)
BUN: 20 mg/dL (ref 8–27)
Bilirubin Total: 0.3 mg/dL (ref 0.0–1.2)
CO2: 23 mmol/L (ref 20–29)
Calcium: 9.1 mg/dL (ref 8.6–10.2)
Chloride: 103 mmol/L (ref 96–106)
Creatinine, Ser: 1.33 mg/dL — ABNORMAL HIGH (ref 0.76–1.27)
Globulin, Total: 2.7 g/dL (ref 1.5–4.5)
Glucose: 92 mg/dL (ref 70–99)
Potassium: 4.6 mmol/L (ref 3.5–5.2)
Sodium: 141 mmol/L (ref 134–144)
Total Protein: 6.6 g/dL (ref 6.0–8.5)
eGFR: 52 mL/min/{1.73_m2} — ABNORMAL LOW (ref >59)

## 2023-01-27 ENCOUNTER — Encounter: Payer: Self-pay | Admitting: Sports Medicine

## 2023-01-27 DIAGNOSIS — E039 Hypothyroidism, unspecified: Secondary | ICD-10-CM

## 2023-01-27 MED ORDER — SYNTHROID 75 MCG PO TABS: 75.0000 ug | ORAL_TABLET | Freq: Every day | ORAL | 3 refills | Status: DC

## 2023-01-29 MED ORDER — LEVOTHYROXINE SODIUM 75 MCG PO TABS
75.0000 ug | ORAL_TABLET | Freq: Every day | ORAL | 3 refills | Status: DC
Start: 2023-01-29 — End: 2023-06-29

## 2023-01-29 NOTE — Addendum Note (Signed)
Addended by: Monica Becton on: 01/29/2023 10:22 AM   Modules accepted: Orders

## 2023-02-02 DIAGNOSIS — I2510 Atherosclerotic heart disease of native coronary artery without angina pectoris: Secondary | ICD-10-CM | POA: Diagnosis not present

## 2023-02-02 DIAGNOSIS — I951 Orthostatic hypotension: Secondary | ICD-10-CM | POA: Diagnosis not present

## 2023-02-02 DIAGNOSIS — E785 Hyperlipidemia, unspecified: Secondary | ICD-10-CM | POA: Diagnosis not present

## 2023-02-02 DIAGNOSIS — I359 Nonrheumatic aortic valve disorder, unspecified: Secondary | ICD-10-CM | POA: Diagnosis not present

## 2023-02-02 DIAGNOSIS — N183 Chronic kidney disease, stage 3 unspecified: Secondary | ICD-10-CM | POA: Diagnosis not present

## 2023-02-02 DIAGNOSIS — I10 Essential (primary) hypertension: Secondary | ICD-10-CM | POA: Diagnosis not present

## 2023-02-11 DIAGNOSIS — L859 Epidermal thickening, unspecified: Secondary | ICD-10-CM | POA: Diagnosis not present

## 2023-02-11 DIAGNOSIS — D485 Neoplasm of uncertain behavior of skin: Secondary | ICD-10-CM | POA: Diagnosis not present

## 2023-02-11 DIAGNOSIS — Z08 Encounter for follow-up examination after completed treatment for malignant neoplasm: Secondary | ICD-10-CM | POA: Diagnosis not present

## 2023-02-11 DIAGNOSIS — D045 Carcinoma in situ of skin of trunk: Secondary | ICD-10-CM | POA: Diagnosis not present

## 2023-02-11 DIAGNOSIS — L821 Other seborrheic keratosis: Secondary | ICD-10-CM | POA: Diagnosis not present

## 2023-02-11 DIAGNOSIS — D235 Other benign neoplasm of skin of trunk: Secondary | ICD-10-CM | POA: Diagnosis not present

## 2023-02-11 DIAGNOSIS — Z85828 Personal history of other malignant neoplasm of skin: Secondary | ICD-10-CM | POA: Diagnosis not present

## 2023-02-11 DIAGNOSIS — L814 Other melanin hyperpigmentation: Secondary | ICD-10-CM | POA: Diagnosis not present

## 2023-02-18 DIAGNOSIS — D045 Carcinoma in situ of skin of trunk: Secondary | ICD-10-CM | POA: Diagnosis not present

## 2023-02-19 ENCOUNTER — Encounter: Payer: Self-pay | Admitting: Sports Medicine

## 2023-02-19 DIAGNOSIS — J4541 Moderate persistent asthma with (acute) exacerbation: Secondary | ICD-10-CM

## 2023-02-19 MED ORDER — TRELEGY ELLIPTA 200-62.5-25 MCG/ACT IN AEPB: 1.0000 | INHALATION_SPRAY | Freq: Every day | RESPIRATORY_TRACT | 3 refills | Status: DC

## 2023-02-24 ENCOUNTER — Other Ambulatory Visit: Payer: Self-pay | Admitting: Sports Medicine

## 2023-03-03 ENCOUNTER — Ambulatory Visit: Payer: Medicare Other

## 2023-03-03 ENCOUNTER — Ambulatory Visit (INDEPENDENT_AMBULATORY_CARE_PROVIDER_SITE_OTHER): Payer: Medicare Other | Admitting: Sports Medicine

## 2023-03-03 ENCOUNTER — Encounter: Payer: Self-pay | Admitting: Sports Medicine

## 2023-03-03 DIAGNOSIS — J4541 Moderate persistent asthma with (acute) exacerbation: Secondary | ICD-10-CM | POA: Diagnosis not present

## 2023-03-03 DIAGNOSIS — M7541 Impingement syndrome of right shoulder: Secondary | ICD-10-CM

## 2023-03-03 DIAGNOSIS — J984 Other disorders of lung: Secondary | ICD-10-CM

## 2023-03-03 MED ORDER — FLUTICASONE PROPIONATE 50 MCG/ACT NA SUSP: NASAL | 3 refills | Status: AC

## 2023-03-03 MED ORDER — DOXYCYCLINE HYCLATE 100 MG PO TABS
100.0000 mg | ORAL_TABLET | Freq: Two times a day (BID) | ORAL | 0 refills | Status: AC
Start: 2023-03-03 — End: 2023-03-10

## 2023-03-03 NOTE — Assessment & Plan Note (Signed)
Impingement syndrome diagnosed approximately 6 weeks ago, doing a lot better with home PT, continues to improve, we will hold off on additional intervention unless he plateaus and pain is intractable.

## 2023-03-03 NOTE — Progress Notes (Signed)
    Procedures performed today:    None.  Independent interpretation of notes and tests performed by another provider:   None.  Brief History, Exam, Impression, and Recommendations:    Asthmatic bronchitis with bronchiectasis This pleasant 89 year old male returns, we treated him about 2-1/2 weeks ago for cough and shortness of breath, he does have a history of asthmatic bronchitis with bronchiectasis on CT scans well-controlled with Trelegy Occasional use of albuterol. Ultimately we treated with azithromycin, prednisone, chest x-ray did reveal pneumonia. He is doing much better today, he still has some postnasal drip and a bit of cough, adding Flonase. I do still hear a right lower lobe wheeze, I do think we need a chest x-ray and another course of antibiotics for him. Adding doxycycline.  Impingement syndrome, shoulder, right Impingement syndrome diagnosed approximately 6 weeks ago, doing a lot better with home PT, continues to improve, we will hold off on additional intervention unless he plateaus and pain is intractable.    ____________________________________________ Ihor Austin. Benjamin Stain, M.D., ABFM., CAQSM., AME. Primary Care and Sports Medicine [facility]  Adjunct Professor of Family Medicine  [employer] [facility] [employer]  Restaurant manager, fast food

## 2023-03-03 NOTE — Assessment & Plan Note (Signed)
This pleasant 89 year old male returns, we treated him about 2-1/2 weeks ago for cough and shortness of breath, he does have a history of asthmatic bronchitis with bronchiectasis on CT scans well-controlled with Trelegy Occasional use of albuterol. Ultimately we treated with azithromycin, prednisone, chest x-ray did reveal pneumonia. He is doing much better today, he still has some postnasal drip and a bit of cough, adding Flonase. I do still hear a right lower lobe wheeze, I do think we need a chest x-ray and another course of antibiotics for him. Adding doxycycline.

## 2023-03-20 DIAGNOSIS — Z23 Encounter for immunization: Secondary | ICD-10-CM | POA: Diagnosis not present

## 2023-05-06 DIAGNOSIS — H401132 Primary open-angle glaucoma, bilateral, moderate stage: Secondary | ICD-10-CM | POA: Diagnosis not present

## 2023-05-13 DIAGNOSIS — Z08 Encounter for follow-up examination after completed treatment for malignant neoplasm: Secondary | ICD-10-CM | POA: Diagnosis not present

## 2023-05-13 DIAGNOSIS — L821 Other seborrheic keratosis: Secondary | ICD-10-CM | POA: Diagnosis not present

## 2023-05-13 DIAGNOSIS — D0461 Carcinoma in situ of skin of right upper limb, including shoulder: Secondary | ICD-10-CM | POA: Diagnosis not present

## 2023-05-13 DIAGNOSIS — L814 Other melanin hyperpigmentation: Secondary | ICD-10-CM | POA: Diagnosis not present

## 2023-05-13 DIAGNOSIS — D235 Other benign neoplasm of skin of trunk: Secondary | ICD-10-CM | POA: Diagnosis not present

## 2023-05-13 DIAGNOSIS — D045 Carcinoma in situ of skin of trunk: Secondary | ICD-10-CM | POA: Diagnosis not present

## 2023-05-13 DIAGNOSIS — X32XXXA Exposure to sunlight, initial encounter: Secondary | ICD-10-CM | POA: Diagnosis not present

## 2023-05-13 DIAGNOSIS — D485 Neoplasm of uncertain behavior of skin: Secondary | ICD-10-CM | POA: Diagnosis not present

## 2023-05-13 DIAGNOSIS — Z85828 Personal history of other malignant neoplasm of skin: Secondary | ICD-10-CM | POA: Diagnosis not present

## 2023-05-13 DIAGNOSIS — L570 Actinic keratosis: Secondary | ICD-10-CM | POA: Diagnosis not present

## 2023-05-13 DIAGNOSIS — D0439 Carcinoma in situ of skin of other parts of face: Secondary | ICD-10-CM | POA: Diagnosis not present

## 2023-05-17 ENCOUNTER — Encounter: Payer: Self-pay | Admitting: Sports Medicine

## 2023-05-17 DIAGNOSIS — E039 Hypothyroidism, unspecified: Secondary | ICD-10-CM

## 2023-05-17 DIAGNOSIS — J4541 Moderate persistent asthma with (acute) exacerbation: Secondary | ICD-10-CM

## 2023-05-18 MED ORDER — OMEPRAZOLE 40 MG PO CPDR: 40.0000 mg | DELAYED_RELEASE_CAPSULE | Freq: Every day | ORAL | 3 refills | Status: AC

## 2023-05-18 MED ORDER — TRELEGY ELLIPTA 200-62.5-25 MCG/ACT IN AEPB: 1.0000 | INHALATION_SPRAY | Freq: Every day | RESPIRATORY_TRACT | 3 refills | Status: AC

## 2023-05-18 NOTE — Telephone Encounter (Signed)
Trelelgy - last written 02/19/2023 Omeprazole 40mg   last written 08/21/2022 Last OV 11/1/92024 Upcoming appt schld 09/01/23 AWV

## 2023-05-18 NOTE — Telephone Encounter (Signed)
Requesting rx rf of  Trelegy last written

## 2023-06-03 DIAGNOSIS — D0439 Carcinoma in situ of skin of other parts of face: Secondary | ICD-10-CM | POA: Diagnosis not present

## 2023-06-03 DIAGNOSIS — D045 Carcinoma in situ of skin of trunk: Secondary | ICD-10-CM | POA: Diagnosis not present

## 2023-06-03 DIAGNOSIS — D0461 Carcinoma in situ of skin of right upper limb, including shoulder: Secondary | ICD-10-CM | POA: Diagnosis not present

## 2023-06-17 MED ORDER — SIMVASTATIN 40 MG PO TABS: 40.0000 mg | ORAL_TABLET | Freq: Every evening | ORAL | 3 refills | Status: AC

## 2023-06-17 MED ORDER — TERAZOSIN HCL 5 MG PO CAPS: 5.0000 mg | ORAL_CAPSULE | Freq: Every day | ORAL | 3 refills | Status: DC

## 2023-06-17 NOTE — Addendum Note (Signed)
 Addended by: Chalmers Cater on: 06/17/2023 09:18 AM   Modules accepted: Orders

## 2023-06-29 MED ORDER — LEVOTHYROXINE SODIUM 75 MCG PO TABS: 75.0000 ug | ORAL_TABLET | Freq: Every day | ORAL | 1 refills | Status: DC

## 2023-06-29 NOTE — Addendum Note (Signed)
 Addended by: Elizabeth Palau on: 06/29/2023 01:56 PM   Modules accepted: Orders

## 2023-07-02 ENCOUNTER — Encounter: Payer: Self-pay | Admitting: Family

## 2023-07-02 NOTE — Telephone Encounter (Signed)
 He has been scheduled for tomorrow. He did not have shortness of breath while on the phone.

## 2023-07-03 ENCOUNTER — Ambulatory Visit

## 2023-07-03 ENCOUNTER — Encounter: Payer: Self-pay | Admitting: Family

## 2023-07-03 ENCOUNTER — Ambulatory Visit (INDEPENDENT_AMBULATORY_CARE_PROVIDER_SITE_OTHER): Admitting: Medical-Surgical

## 2023-07-03 VITALS — BP 169/82 | HR 57 | Temp 98.3°F | Ht 70.0 in | Wt 170.0 lb

## 2023-07-03 DIAGNOSIS — J4541 Moderate persistent asthma with (acute) exacerbation: Secondary | ICD-10-CM

## 2023-07-03 DIAGNOSIS — R0602 Shortness of breath: Secondary | ICD-10-CM | POA: Diagnosis not present

## 2023-07-03 DIAGNOSIS — R0989 Other specified symptoms and signs involving the circulatory and respiratory systems: Secondary | ICD-10-CM

## 2023-07-03 DIAGNOSIS — I700 Atherosclerosis of aorta: Secondary | ICD-10-CM | POA: Diagnosis not present

## 2023-07-03 DIAGNOSIS — R918 Other nonspecific abnormal finding of lung field: Secondary | ICD-10-CM | POA: Diagnosis not present

## 2023-07-03 MED ORDER — PREDNISONE 50 MG PO TABS: 50.0000 mg | ORAL_TABLET | Freq: Every day | ORAL | 0 refills | Status: DC

## 2023-07-03 MED ORDER — AZITHROMYCIN 250 MG PO TABS: ORAL_TABLET | ORAL | 0 refills | Status: AC

## 2023-07-05 NOTE — Progress Notes (Signed)
        Established patient visit  History, exam, impression, and plan:  1. Moderate persistent asthmatic bronchitis with acute exacerbation (Primary) Very pleasant 89 year old gentleman presenting today with a history of moderate persistent asthma.  He reports that he is having trouble taking a deep breath.  He has been short of breath for the last 2 weeks and his symptoms continue to worsen.  He also has sinus congestion with postnasal drip as well as chest congestion.  Notes that the postnasal drip has been going on for at least a month although it has gotten a little bit better.  Denies fever, chills, sore throat, chest pain, nausea, vomiting, and diarrhea.  He does admit to some ear pressure and popping.  Currently using Trelegy for maintenance inhaler.  Has his albuterol inhaler that he has used twice in the last week.  Taking Singulair 10 mg nightly.  Has Flonase on his chart but admits that he has not been using this.  Also has Claritin on his file but he has not been taking a daily antihistamine either.  On exam he does have some postnasal drip as well as a deep wet nonproductive cough.  Intermittent scattered wheezing noted throughout all lobes.  Rales/rhonchi noted mostly in the bilateral bases.  Recommend continuing Trelegy, Singulair, and the albuterol inhaler as prescribed.  Would like for him to start a daily antihistamine as this seems to be allergy mediated.  For now we will treat him with a burst of steroids for 5 days as well as azithromycin.  Getting a chest x-ray today to rule out pneumonia. - predniSONE (DELTASONE) 50 MG tablet; Take 1 tablet (50 mg total) by mouth daily with breakfast.  Dispense: 5 tablet; Refill: 0 - azithromycin (ZITHROMAX) 250 MG tablet; Take 2 tablets on day 1, then 1 tablet daily on days 2 through 5  Dispense: 6 tablet; Refill: 0 - DG Chest 2 View; Future   Procedures performed this visit: None.  Return if symptoms worsen or fail to  improve.  __________________________________ Thayer Ohm, DNP, APRN, FNP-BC Primary Care and Sports Medicine [facility]

## 2023-07-06 ENCOUNTER — Ambulatory Visit: Admitting: Sports Medicine

## 2023-07-09 ENCOUNTER — Encounter: Payer: Self-pay | Admitting: Medical-Surgical

## 2023-07-09 ENCOUNTER — Other Ambulatory Visit: Payer: Self-pay | Admitting: Medical-Surgical

## 2023-07-09 MED ORDER — AMOXICILLIN-POT CLAVULANATE 875-125 MG PO TABS
1.0000 | ORAL_TABLET | Freq: Two times a day (BID) | ORAL | 0 refills | Status: DC
Start: 2023-07-09 — End: 2023-09-01

## 2023-07-09 NOTE — Telephone Encounter (Signed)
 [facility] Radiology.

## 2023-08-05 ENCOUNTER — Other Ambulatory Visit: Payer: Self-pay | Admitting: Sports Medicine

## 2023-08-05 DIAGNOSIS — J4541 Moderate persistent asthma with (acute) exacerbation: Secondary | ICD-10-CM

## 2023-08-05 MED ORDER — MONTELUKAST SODIUM 10 MG PO TABS: 10.0000 mg | ORAL_TABLET | Freq: Every day | ORAL | 3 refills | Status: DC

## 2023-09-01 ENCOUNTER — Ambulatory Visit (INDEPENDENT_AMBULATORY_CARE_PROVIDER_SITE_OTHER): Payer: Medicare Other

## 2023-09-01 VITALS — Ht 70.0 in | Wt 160.0 lb

## 2023-09-01 DIAGNOSIS — Z0000 Encounter for general adult medical examination without abnormal findings: Secondary | ICD-10-CM | POA: Diagnosis not present

## 2023-09-01 NOTE — Progress Notes (Signed)
 Subjective:   Marcus JANOSKI Sr. is a 89 y.o. male who presents for Medicare Annual/Subsequent preventive examination.  Visit Complete: Virtual I connected with  Marcus Mighty Sr. on 09/01/23 by a audio enabled telemedicine application and verified that I am speaking with the correct person using two identifiers.  Patient Location: Home  Provider Location: Office/Clinic  I discussed the limitations of evaluation and management by telemedicine. The patient expressed understanding and agreed to proceed.  Vital Signs: Because this visit was a virtual/telehealth visit, some criteria may be missing or patient reported. Any vitals not documented were not able to be obtained and vitals that have been documented are patient reported.  Patient Medicare AWV questionnaire was completed by the patient on 08/26/2023; I have confirmed that all information answered by patient is correct and no changes since this date.  Cardiac Risk Factors include: advanced age (>28men, >38 women);male gender;hypertension;dyslipidemia;family history of premature cardiovascular disease     Objective:    Today's Vitals   09/01/23 1505  Weight: 160 lb (72.6 kg)  Height: 5\' 10"  (1.778 m)   Body mass index is 22.96 kg/m.     09/01/2023    3:16 PM 08/26/2022    3:58 PM 07/14/2022    4:25 PM 10/03/2016    5:46 PM 09/29/2016    3:04 PM 10/12/2015   10:21 AM 10/10/2015   11:16 AM  Advanced Directives  Does Patient Have a Medical Advance Directive? Yes No Yes No No No No  Type of Estate agent of Marcus Orr;Living will        Does patient want to make changes to medical advance directive? No - Patient declined        Copy of Healthcare Power of Attorney in Chart? No - copy requested        Would patient like information on creating a medical advance directive?  No - Patient declined  No - Patient declined  No - patient declined information     Current Medications (verified) Outpatient Encounter  Medications as of 09/01/2023  Medication Sig   albuterol  (VENTOLIN  HFA) 108 (90 Base) MCG/ACT inhaler Inhale 2 puffs into the lungs every 6 (six) hours as needed for wheezing.   amLODipine  (NORVASC ) 5 MG tablet TAKE 1 TABLET IN THE       MORNING AND AT BEDTIME   Cobalamine Combinations (FOLIC + B12) [phone] MCG TABS Take 1 tablet by mouth daily.   COSOPT  22.3-6.8 MG/ML ophthalmic solution INSTILL 1 DROP INTO BOTH   EYES TWICE DAILY.   finasteride  (PROSCAR ) 5 MG tablet Take 1 tablet (5 mg total) by mouth every evening.   fluticasone  (FLONASE ) 50 MCG/ACT nasal spray One spray in each nostril twice a day   Fluticasone -Umeclidin-Vilant (TRELEGY ELLIPTA ) 200-62.5-25 MCG/ACT AEPB Inhale 1 puff into the lungs daily.   levothyroxine  (SYNTHROID ) 75 MCG tablet Take 1 tablet (75 mcg total) by mouth daily.   lisinopril  (ZESTRIL ) 40 MG tablet Take 0.5 tablets (20 mg total) by mouth in the morning and at bedtime.   loratadine  (CLARITIN ) 10 MG tablet Take 1 tablet (10 mg total) by mouth daily.   meclizine  (ANTIVERT ) 25 MG tablet Take 1 tablet (25 mg total) by mouth 3 (three) times daily as needed for dizziness or nausea.   montelukast  (SINGULAIR ) 10 MG tablet Take 1 tablet (10 mg total) by mouth at bedtime.   omeprazole  (PRILOSEC) 40 MG capsule Take 1 capsule (40 mg total) by mouth at bedtime.   simvastatin  (  ZOCOR ) 40 MG tablet Take 1 tablet (40 mg total) by mouth every evening.   tadalafil  (CIALIS ) 10 MG tablet Take 1 tablet (10 mg total) by mouth daily as needed.   triamcinolone  cream (KENALOG) 0.1 % Apply topically 2 (two) times daily.   vitamin B-12 (CYANOCOBALAMIN ) 1000 MCG tablet Take 1 tablet (1,000 mcg total) by mouth daily.   ferrous sulfate  325 (65 FE) MG EC tablet Take 1 tablet (325 mg total) by mouth 3 (three) times daily with meals. (Patient not taking: Reported on 09/01/2023)   hydrochlorothiazide  (HYDRODIURIL ) 25 MG tablet Take 0.5 tablets (12.5 mg total) by mouth 2 (two) times daily. (Patient not  taking: Reported on 09/01/2023)   terazosin  (HYTRIN ) 5 MG capsule Take 1 capsule (5 mg total) by mouth at bedtime. (Patient not taking: Reported on 09/01/2023)   valACYclovir (VALTREX) 1000 MG tablet Take 1,000 mg by mouth 3 (three) times daily. (Patient not taking: Reported on 09/01/2023)   [DISCONTINUED] amoxicillin -clavulanate (AUGMENTIN ) 875-125 MG tablet Take 1 tablet by mouth 2 (two) times daily.   [DISCONTINUED] nystatin  (MYCOSTATIN ) 100000 UNIT/ML suspension Take 5 mLs (500,000 Units total) by mouth 4 (four) times daily. Swish for 30 seconds and spit out.   [DISCONTINUED] predniSONE  (DELTASONE ) 50 MG tablet Take 1 tablet (50 mg total) by mouth daily with breakfast.   [DISCONTINUED] Vitamin D , Ergocalciferol , (DRISDOL ) 1.25 MG (50000 UNIT) CAPS capsule Take 1 capsule (50,000 Units total) by mouth every 7 (seven) days. Take for 8 total doses(weeks)   No facility-administered encounter medications on file as of 09/01/2023.    Allergies (verified) Tamiflu  [oseltamivir ]   History: Past Medical History:  Diagnosis Date   Aortic stenosis    mild AS 09/2015 echo   Aortic valve disease    mild   Asthma    as child   Basal ganglia hemorrhage (HCC)    left basal ganlia intraparenchymal hemoorhage 06/17/16 (in the setting of HTN emergency)   Cataract 2009   Chronic kidney disease 2015   Colon cancer (HCC) 10/10/2015   COPD (chronic obstructive pulmonary disease) (HCC) 2018   GERD (gastroesophageal reflux disease)    Glaucoma 2010   Hyperlipidemia    Hypertension    Hypothyroidism    Iron deficiency anemia due to chronic blood loss 10/02/2015   Lower GI bleed 10/02/2015   Skin cancer    Stroke (HCC) 2021   Thyroid  disease    TIA (transient ischemic attack)    Past Surgical History:  Procedure Laterality Date   COLON RESECTION     ca   COLON SURGERY  2018   EYE SURGERY     JOINT REPLACEMENT     bil   REPLACEMENT TOTAL KNEE     TOTAL HIP ARTHROPLASTY Right 10/03/2016   TOTAL HIP  ARTHROPLASTY Right 10/03/2016   Procedure: TOTAL HIP ARTHROPLASTY ANTERIOR APPROACH;  Surgeon: Neil Balls, MD;  Location: [facility] OR;  Service: Orthopedics;  Laterality: Right;   History reviewed. No pertinent family history. Social History   Socioeconomic History   Marital status: Married    Spouse name: Adell Hones   Number of children: 3   Years of education: 16   Highest education level: Bachelor's degree (e.g., BA, AB, BS)  Occupational History   Occupation: Retired  Tobacco Use   Smoking status: Never   Smokeless tobacco: Never  Vaping Use   Vaping status: Never Used  Substance and Sexual Activity   Alcohol use: Never   Drug use: Never   Sexual activity: Yes  Other Topics Concern   Not on file  Social History Narrative   Lives with spouse. He has three children. He enjoys travelling.    Social Drivers of Corporate investment banker Strain: Low Risk  (09/01/2023)   Overall Financial Resource Strain (CARDIA)    Difficulty of Paying Living Expenses: Not hard at all  Food Insecurity: No Food Insecurity (09/01/2023)   Hunger Vital Sign    Worried About Running Out of Food in the Last Year: Never true    Ran Out of Food in the Last Year: Never true  Transportation Needs: No Transportation Needs (09/01/2023)   PRAPARE - Administrator, Civil Service (Medical): No    Lack of Transportation (Non-Medical): No  Physical Activity: Sufficiently Active (09/01/2023)   Exercise Vital Sign    Days of Exercise per Week: 7 days    Minutes of Exercise per Session: 30 min  Recent Concern: Physical Activity - Insufficiently Active (07/03/2023)   Exercise Vital Sign    Days of Exercise per Week: 2 days    Minutes of Exercise per Session: 20 min  Stress: No Stress Concern Present (09/01/2023)   [employer] of Occupational Health - Occupational Stress Questionnaire    Feeling of Stress : Not at all  Social Connections: Moderately Integrated (09/01/2023)   Social Connection and  Isolation Panel [NHANES]    Frequency of Communication with Friends and Family: More than three times a week    Frequency of Social Gatherings with Friends and Family: Twice a week    Attends Religious Services: More than 4 times per year    Active Member of [employer] or Organizations: No    Attends Engineer, structural: Never    Marital Status: Married    Tobacco Counseling Counseling given: Not Answered   Clinical Intake:  Pre-visit preparation completed: Yes  Pain : No/denies pain     BMI - recorded: 22.96 Nutritional Status: BMI of 19-24  Normal Nutritional Risks: None Diabetes: No  How often do you need to have someone help you when you read instructions, pamphlets, or other written materials from your doctor or pharmacy?: 1 - Never What is the last grade level you completed in school?: 17  Interpreter Needed?: No      Activities of Daily Living    09/01/2023    3:06 PM 08/25/2023    8:04 PM  In your present state of health, do you have any difficulty performing the following activities:  Hearing? 1 1  Vision? 0 0  Difficulty concentrating or making decisions? 0 0  Walking or climbing stairs? 0 0  Dressing or bathing? 0 0  Doing errands, shopping? 0 0  Preparing Food and eating ? N N  Using the Toilet? N N  In the past six months, have you accidently leaked urine? N N  Do you have problems with loss of bowel control? N N  Managing your Medications? N N  Managing your Finances? N N  Housekeeping or managing your Housekeeping? N N    Patient Care Team: Gean Keels, MD as PCP - General (Family Medicine) Neale Bale, OD (Optometry) Annice Barthel, MD as Referring Physician (Internal Medicine)  Indicate any recent Medical Services you may have received from other than [facility] providers in the past year (date may be approximate).     Assessment:   This is a routine wellness examination for Marcus Orr.  Hearing/Vision screen No results  found.   Goals Addressed  This Visit's Progress    Patient Stated       Patient states he would like to drink more water and exercise more.       Depression Screen    09/01/2023    3:15 PM 03/03/2023    2:12 PM 08/26/2022    3:58 PM 08/15/2022    1:59 PM 07/31/2021   11:07 AM 03/13/2021    2:28 PM 10/19/2020    2:43 PM  PHQ 2/9 Scores  PHQ - 2 Score 0 0 0 0 0 0 0    Fall Risk    09/01/2023    3:16 PM 08/25/2023    8:04 PM 03/03/2023    2:12 PM 08/26/2022    3:58 PM 08/25/2022    3:22 PM  Fall Risk   Falls in the past year? 1 0 0 0 0  Number falls in past yr: 0  0 0   Injury with Fall? 0  0 0   Risk for fall due to : No Fall Risks   Impaired mobility   Follow up Falls evaluation completed  Falls evaluation completed Falls evaluation completed     MEDICARE RISK AT HOME: Medicare Risk at Home Any stairs in or around the home?: Yes If so, are there any without handrails?: No Home free of loose throw rugs in walkways, pet beds, electrical cords, etc?: Yes Adequate lighting in your home to reduce risk of falls?: Yes Life alert?: No Use of a cane, walker or w/c?: No Grab bars in the bathroom?: Yes Shower chair or bench in shower?: Yes Elevated toilet seat or a handicapped toilet?: No  TIMED UP AND GO:  Was the test performed?  No    Cognitive Function:        09/01/2023    3:17 PM 08/26/2022    4:01 PM 07/31/2021   11:11 AM  6CIT Screen  What Year? 0 points 0 points 0 points  What month? 0 points 3 points 0 points  What time? 0 points 0 points 0 points  Count back from 20 0 points 0 points 0 points  Months in reverse 0 points 0 points 0 points  Repeat phrase 2 points 0 points 2 points  Total Score 2 points 3 points 2 points    Immunizations Immunization History  Administered Date(s) Administered   Fluad Quad(high Dose 65+) 01/24/2019, 01/13/2020, 01/16/2022   Influenza Whole 04/15/2011   Influenza, High Dose Seasonal PF 01/01/2017, 01/24/2018,  02/22/2021, 03/20/2023   Influenza,inj,Quad PF,6+ Mos 01/13/2013, 01/13/2013, 01/04/2015, 01/04/2015   Influenza-Unspecified 01/24/2019, 01/13/2020   PFIZER Comirnaty(Gray Top)Covid-19 Tri-Sucrose Vaccine 01/16/2022   PFIZER(Purple Top)SARS-COV-2 Vaccination 05/12/2019, 06/07/2019   Pfizer Covid-19 Vaccine Bivalent Booster 65yrs & up 02/15/2021   Pneumococcal Conjugate-13 01/04/2015   Pneumococcal Polysaccharide-23 06/20/2016   Pneumococcal-Unspecified 06/20/2016   Td 06/07/2020   Td (Adult), 2 Lf Tetanus Toxid, Preservative Free 06/07/2020   Td (Adult),5 Lf Tetanus Toxid, Preservative Free 06/07/2020   Tdap 06/07/2020   Zoster Recombinant(Shingrix ) 01/30/2022, 08/28/2022    TDAP status: Up to date  Flu Vaccine status: Up to date  Pneumococcal vaccine status: Up to date  Covid-19 vaccine status: Declined, Education has been provided regarding the importance of this vaccine but patient still declined. Advised may receive this vaccine at local pharmacy or Health Dept.or vaccine clinic. Aware to provide a copy of the vaccination record if obtained from local pharmacy or Health Dept. Verbalized acceptance and understanding.  Qualifies for Shingles Vaccine? Yes   Zostavax completed  No   Shingrix  Completed?: Yes  Screening Tests Health Maintenance  Topic Date Due   COVID-19 Vaccine (5 - 2024-25 season) 12/14/2022   INFLUENZA VACCINE  11/13/2023   Medicare Annual Wellness (AWV)  08/31/2024   DTaP/Tdap/Td (4 - Td or Tdap) 06/07/2030   Pneumonia Vaccine 42+ Years old  Completed   Zoster Vaccines- Shingrix   Completed   HPV VACCINES  Aged Out   Meningococcal B Vaccine  Aged Out    Health Maintenance  Health Maintenance Due  Topic Date Due   COVID-19 Vaccine (5 - 2024-25 season) 12/14/2022    Colorectal cancer screening: No longer required.   Lung Cancer Screening: (Low Dose CT Chest recommended if Age 31-80 years, 20 pack-year currently smoking OR have quit w/in 15years.) does  not qualify.   Lung Cancer Screening Referral: n/a  Additional Screening:  Hepatitis C Screening: does not qualify; Completed   Vision Screening: Recommended annual ophthalmology exams for early detection of glaucoma and other disorders of the eye. Is the patient up to date with their annual eye exam?  Yes  Who is the provider or what is the name of the office in which the patient attends annual eye exams? [facility]/Dr Ronnell Coins If pt is not established with a provider, would they like to be referred to a provider to establish care? N/a.   Dental Screening: Recommended annual dental exams for proper oral hygiene    Community Resource Referral / Chronic Care Management: CRR required this visit?  No   CCM required this visit?  No     Plan:     I have personally reviewed and noted the following in the patient's chart:   Medical and social history Use of alcohol, tobacco or illicit drugs  Current medications and supplements including opioid prescriptions. Patient is not currently taking opioid prescriptions. Functional ability and status Nutritional status Physical activity Advanced directives List of other physicians Hospitalizations, surgeries, and ER visits in previous 12 months. None Vitals Screenings to include cognitive, depression, and falls Referrals and appointments  In addition, I have reviewed and discussed with patient certain preventive protocols, quality metrics, and best practice recommendations. A written personalized care plan for preventive services as well as general preventive health recommendations were provided to patient.     Aubrey Leaf, CMA   09/01/2023   After Visit Summary: (MyChart) Due to this being a telephonic visit, the after visit summary with patients personalized plan was offered to patient via MyChart   Nurse Notes:   Marcus Mighty Sr. is a 89 y.o. male patient of Thekkekandam, Joselyn Nicely, MD who had a Medicare Annual Wellness  Visit today via telephone. Marcus Orr is Retired and lives with their spouse. He has 3 children. He reports that he is socially active and does interact with friends/family regularly. He is moderately physically active and enjoys travelling.

## 2023-09-01 NOTE — Patient Instructions (Signed)
  Mr. Smead , Thank you for taking time to come for your Medicare Wellness Visit. I appreciate your ongoing commitment to your health goals. Please review the following plan we discussed and let me know if I can assist you in the future.   These are the goals we discussed:  Goals       Patient Stated (pt-stated)      07/31/2021 AWV Goal: Exercise for General Health  Patient will verbalize understanding of the benefits of increased physical activity: Exercising regularly is important. It will improve your overall fitness, flexibility, and endurance. Regular exercise also will improve your overall health. It can help you control your weight, reduce stress, and improve your bone density. Over the next year, patient will increase physical activity as tolerated with a goal of at least 150 minutes of moderate physical activity per week.  You can tell that you are exercising at a moderate intensity if your heart starts beating faster and you start breathing faster but can still hold a conversation. Moderate-intensity exercise ideas include: Walking 1 mile (1.6 km) in about 15 minutes Biking Hiking Golfing Dancing Water aerobics Patient will verbalize understanding of everyday activities that increase physical activity by providing examples like the following: Yard work, such as: Insurance underwriter Gardening Washing windows or floors Patient will be able to explain general safety guidelines for exercising:  Before you start a new exercise program, talk with your health care provider. Do not exercise so much that you hurt yourself, feel dizzy, or get very short of breath. Wear comfortable clothes and wear shoes with good support. Drink plenty of water while you exercise to prevent dehydration or heat stroke. Work out until your breathing and your heartbeat get faster.       Patient Stated (pt-stated)       Patient stated that he would like to be able to walk better.      Patient Stated      Patient states he would like to drink more water and exercise more.         This is a list of the screening recommended for you and due dates:  Health Maintenance  Topic Date Due   COVID-19 Vaccine (5 - 2024-25 season) 12/14/2022   Flu Shot  11/13/2023   Medicare Annual Wellness Visit  08/31/2024   DTaP/Tdap/Td vaccine (4 - Td or Tdap) 06/07/2030   Pneumonia Vaccine  Completed   Zoster (Shingles) Vaccine  Completed   HPV Vaccine  Aged Out   Meningitis B Vaccine  Aged Out

## 2023-09-30 DIAGNOSIS — D0439 Carcinoma in situ of skin of other parts of face: Secondary | ICD-10-CM | POA: Diagnosis not present

## 2023-09-30 DIAGNOSIS — L821 Other seborrheic keratosis: Secondary | ICD-10-CM | POA: Diagnosis not present

## 2023-09-30 DIAGNOSIS — L570 Actinic keratosis: Secondary | ICD-10-CM | POA: Diagnosis not present

## 2023-09-30 DIAGNOSIS — Z85828 Personal history of other malignant neoplasm of skin: Secondary | ICD-10-CM | POA: Diagnosis not present

## 2023-09-30 DIAGNOSIS — Z08 Encounter for follow-up examination after completed treatment for malignant neoplasm: Secondary | ICD-10-CM | POA: Diagnosis not present

## 2023-09-30 DIAGNOSIS — X32XXXA Exposure to sunlight, initial encounter: Secondary | ICD-10-CM | POA: Diagnosis not present

## 2023-09-30 DIAGNOSIS — L814 Other melanin hyperpigmentation: Secondary | ICD-10-CM | POA: Diagnosis not present

## 2023-09-30 DIAGNOSIS — D0471 Carcinoma in situ of skin of right lower limb, including hip: Secondary | ICD-10-CM | POA: Diagnosis not present

## 2023-09-30 DIAGNOSIS — D235 Other benign neoplasm of skin of trunk: Secondary | ICD-10-CM | POA: Diagnosis not present

## 2023-09-30 DIAGNOSIS — D045 Carcinoma in situ of skin of trunk: Secondary | ICD-10-CM | POA: Diagnosis not present

## 2023-09-30 DIAGNOSIS — D485 Neoplasm of uncertain behavior of skin: Secondary | ICD-10-CM | POA: Diagnosis not present

## 2023-10-20 DIAGNOSIS — D0439 Carcinoma in situ of skin of other parts of face: Secondary | ICD-10-CM | POA: Diagnosis not present

## 2023-10-20 DIAGNOSIS — D485 Neoplasm of uncertain behavior of skin: Secondary | ICD-10-CM | POA: Diagnosis not present

## 2023-10-20 DIAGNOSIS — D0471 Carcinoma in situ of skin of right lower limb, including hip: Secondary | ICD-10-CM | POA: Diagnosis not present

## 2023-10-20 DIAGNOSIS — C44722 Squamous cell carcinoma of skin of right lower limb, including hip: Secondary | ICD-10-CM | POA: Diagnosis not present

## 2023-10-20 DIAGNOSIS — D045 Carcinoma in situ of skin of trunk: Secondary | ICD-10-CM | POA: Diagnosis not present

## 2023-10-24 ENCOUNTER — Other Ambulatory Visit: Payer: Self-pay | Admitting: Sports Medicine

## 2023-10-24 DIAGNOSIS — J4541 Moderate persistent asthma with (acute) exacerbation: Secondary | ICD-10-CM

## 2023-11-06 DIAGNOSIS — Z961 Presence of intraocular lens: Secondary | ICD-10-CM | POA: Diagnosis not present

## 2023-11-06 DIAGNOSIS — H401132 Primary open-angle glaucoma, bilateral, moderate stage: Secondary | ICD-10-CM | POA: Diagnosis not present

## 2023-11-25 ENCOUNTER — Encounter: Payer: Self-pay | Admitting: Sports Medicine

## 2023-11-25 DIAGNOSIS — I10 Essential (primary) hypertension: Secondary | ICD-10-CM

## 2023-11-26 MED ORDER — LISINOPRIL 40 MG PO TABS: 20.0000 mg | ORAL_TABLET | Freq: Two times a day (BID) | ORAL | 3 refills | Status: AC

## 2023-11-30 ENCOUNTER — Other Ambulatory Visit: Payer: Self-pay | Admitting: Sports Medicine

## 2023-11-30 DIAGNOSIS — E039 Hypothyroidism, unspecified: Secondary | ICD-10-CM

## 2023-12-01 DIAGNOSIS — D0471 Carcinoma in situ of skin of right lower limb, including hip: Secondary | ICD-10-CM | POA: Diagnosis not present

## 2023-12-15 ENCOUNTER — Encounter: Payer: Self-pay | Admitting: Sports Medicine

## 2023-12-21 ENCOUNTER — Other Ambulatory Visit: Payer: Self-pay | Admitting: Sports Medicine

## 2023-12-21 DIAGNOSIS — N401 Enlarged prostate with lower urinary tract symptoms: Secondary | ICD-10-CM

## 2023-12-21 NOTE — Telephone Encounter (Unsigned)
 Copied from CRM [id number]. Topic: Clinical - Medication Refill >> Dec 21, 2023 12:03 PM Kevelyn M wrote: Medication: finasteride  (PROSCAR ) 5 MG tablet  Has the patient contacted their pharmacy? No (Agent: If no, request that the patient contact the pharmacy for the refill. If patient does not wish to contact the pharmacy document the reason why and proceed with request.) (Agent: If yes, when and what did the pharmacy advise?)  This is the patient's preferred pharmacy:   [id number] - [place], [place] - [address] [address] [address] [place] [address] Phone: [phone] Fax: [phone]  Is this the correct pharmacy for this prescription? Yes If no, delete pharmacy and type the correct one.   Has the prescription been filled recently? No  Is the patient out of the medication? Yes  Has the patient been seen for an appointment in the last year OR does the patient have an upcoming appointment? Yes  Can we respond through MyChart? Yes  Agent: Please be advised that Rx refills may take up to 3 business days. We ask that you follow-up with your pharmacy.

## 2023-12-22 MED ORDER — FINASTERIDE 5 MG PO TABS: 5.0000 mg | ORAL_TABLET | Freq: Every evening | ORAL | 3 refills | Status: AC

## 2024-01-26 ENCOUNTER — Other Ambulatory Visit: Payer: Self-pay | Admitting: Physician Assistant

## 2024-01-26 ENCOUNTER — Ambulatory Visit: Payer: Self-pay | Admitting: Physician Assistant

## 2024-01-26 ENCOUNTER — Ambulatory Visit (INDEPENDENT_AMBULATORY_CARE_PROVIDER_SITE_OTHER): Admitting: Physician Assistant

## 2024-01-26 ENCOUNTER — Ambulatory Visit

## 2024-01-26 ENCOUNTER — Encounter: Payer: Self-pay | Admitting: Physician Assistant

## 2024-01-26 VITALS — BP 116/56 | HR 56 | Ht 70.0 in | Wt 161.0 lb

## 2024-01-26 DIAGNOSIS — T751XXD Unspecified effects of drowning and nonfatal submersion, subsequent encounter: Secondary | ICD-10-CM

## 2024-01-26 DIAGNOSIS — R058 Other specified cough: Secondary | ICD-10-CM | POA: Diagnosis not present

## 2024-01-26 DIAGNOSIS — R7989 Other specified abnormal findings of blood chemistry: Secondary | ICD-10-CM

## 2024-01-26 DIAGNOSIS — T751XXA Unspecified effects of drowning and nonfatal submersion, initial encounter: Secondary | ICD-10-CM | POA: Insufficient documentation

## 2024-01-26 DIAGNOSIS — R0602 Shortness of breath: Secondary | ICD-10-CM

## 2024-01-26 DIAGNOSIS — R0989 Other specified symptoms and signs involving the circulatory and respiratory systems: Secondary | ICD-10-CM | POA: Diagnosis not present

## 2024-01-26 DIAGNOSIS — D72829 Elevated white blood cell count, unspecified: Secondary | ICD-10-CM | POA: Diagnosis not present

## 2024-01-26 DIAGNOSIS — J811 Chronic pulmonary edema: Secondary | ICD-10-CM | POA: Diagnosis not present

## 2024-01-26 DIAGNOSIS — R55 Syncope and collapse: Secondary | ICD-10-CM

## 2024-01-26 DIAGNOSIS — Z09 Encounter for follow-up examination after completed treatment for conditions other than malignant neoplasm: Secondary | ICD-10-CM

## 2024-01-26 DIAGNOSIS — R0902 Hypoxemia: Secondary | ICD-10-CM | POA: Diagnosis not present

## 2024-01-26 DIAGNOSIS — J4540 Moderate persistent asthma, uncomplicated: Secondary | ICD-10-CM | POA: Diagnosis not present

## 2024-01-26 MED ORDER — AIRSUPRA 90-80 MCG/ACT IN AERO: 2.0000 | INHALATION_SPRAY | Freq: Four times a day (QID) | RESPIRATORY_TRACT | 1 refills | Status: AC | PRN

## 2024-01-26 NOTE — Progress Notes (Signed)
 GREAT news. No abnormal findings on chest xray.

## 2024-01-26 NOTE — Patient Instructions (Signed)
 Get labs today.  Get CXR today downstairs.
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
## 2024-01-26 NOTE — Telephone Encounter (Signed)
 A prior authorization is required for the albuterol -budesonide . Thanks in advance. The ordering provider is Vermell Bologna.

## 2024-01-27 ENCOUNTER — Encounter: Payer: Self-pay | Admitting: Family

## 2024-01-27 ENCOUNTER — Encounter: Payer: Self-pay | Admitting: Physician Assistant

## 2024-01-27 ENCOUNTER — Telehealth: Payer: Self-pay

## 2024-01-27 ENCOUNTER — Other Ambulatory Visit (HOSPITAL_COMMUNITY): Payer: Self-pay

## 2024-01-27 DIAGNOSIS — R0902 Hypoxemia: Secondary | ICD-10-CM | POA: Insufficient documentation

## 2024-01-27 DIAGNOSIS — J811 Chronic pulmonary edema: Secondary | ICD-10-CM | POA: Insufficient documentation

## 2024-01-27 DIAGNOSIS — R55 Syncope and collapse: Secondary | ICD-10-CM | POA: Insufficient documentation

## 2024-01-27 NOTE — Telephone Encounter (Signed)
 Pharmacy Patient Advocate Encounter  Received notification from [facility] that Prior Authorization for Airsupra 90-80mg  has been APPROVED from 12/28/23 to 01/26/25. Ran test claim, Copay is $108.17 for 1 inhaler. This test claim was processed through [facility]- copay amounts may vary at other pharmacies due to pharmacy/plan contracts, or as the patient moves through the different stages of their insurance plan.   PA #/Case ID/Reference #: [id number]  [facility] to notify of the approval.

## 2024-01-27 NOTE — Progress Notes (Signed)
 Henrique,   Kidney function continues to be low but better than SCr 2.05 then 1.47 at discharge  WBC decreased down from 15,940 to 11,600.  Recheck in 4 weeks.   Pending BNP.

## 2024-01-27 NOTE — Telephone Encounter (Signed)
 Pharmacy Patient Advocate Encounter   Received notification from RX Request Messages that prior authorization for Airsupra 90-80mcg is required/requested.   Insurance verification completed.   The patient is insured through [employer].   Per test claim: PA required; PA submitted to above mentioned insurance via Latent Key/confirmation #/EOC [id number] Status is pending

## 2024-01-27 NOTE — Progress Notes (Signed)
LMVM for patient to return call. 

## 2024-01-28 LAB — CBC WITH DIFFERENTIAL/PLATELET
Basophils Absolute: 0.1 x10E3/uL (ref 0.0–0.2)
Basos: 0 %
EOS (ABSOLUTE): 0.1 x10E3/uL (ref 0.0–0.4)
Eos: 1 %
Hematocrit: 33.6 % — ABNORMAL LOW (ref 37.5–51.0)
Hemoglobin: 11 g/dL — ABNORMAL LOW (ref 13.0–17.7)
Immature Grans (Abs): 0.1 x10E3/uL (ref 0.0–0.1)
Immature Granulocytes: 1 %
Lymphocytes Absolute: 0.8 x10E3/uL (ref 0.7–3.1)
Lymphs: 7 %
MCH: 29.4 pg (ref 26.6–33.0)
MCHC: 32.7 g/dL (ref 31.5–35.7)
MCV: 90 fL (ref 79–97)
Monocytes Absolute: 0.9 x10E3/uL (ref 0.1–0.9)
Monocytes: 8 %
Neutrophils Absolute: 9.7 x10E3/uL — ABNORMAL HIGH (ref 1.4–7.0)
Neutrophils: 83 %
Platelets: 368 x10E3/uL (ref 150–450)
RBC: 3.74 x10E6/uL — ABNORMAL LOW (ref 4.14–5.80)
RDW: 13.5 % (ref 11.6–15.4)
WBC: 11.6 x10E3/uL — ABNORMAL HIGH (ref 3.4–10.8)

## 2024-01-28 LAB — TSH+FREE T4
Free T4: 1.94 ng/dL — ABNORMAL HIGH (ref 0.82–1.77)
TSH: 1.76 u[IU]/mL (ref 0.450–4.500)

## 2024-01-28 LAB — BRAIN NATRIURETIC PEPTIDE: BNP: 103.2 pg/mL — AB (ref 0.0–100.0)

## 2024-01-28 LAB — IRON,TIBC AND FERRITIN PANEL
Ferritin: 317 ng/mL (ref 30–400)
Iron Saturation: 13 % — ABNORMAL LOW (ref 15–55)
Iron: 23 ug/dL — ABNORMAL LOW (ref 38–169)
Total Iron Binding Capacity: 182 ug/dL — ABNORMAL LOW (ref 250–450)
UIBC: 159 ug/dL (ref 111–343)

## 2024-01-28 LAB — CMP14+EGFR
ALT: 11 IU/L (ref 0–44)
AST: 15 IU/L (ref 0–40)
Albumin: 3.3 g/dL — ABNORMAL LOW (ref 3.7–4.7)
Alkaline Phosphatase: 67 IU/L (ref 48–129)
BUN/Creatinine Ratio: 16 (ref 10–24)
BUN: 25 mg/dL (ref 8–27)
Bilirubin Total: 0.5 mg/dL (ref 0.0–1.2)
CO2: 19 mmol/L — ABNORMAL LOW (ref 20–29)
Calcium: 8.6 mg/dL (ref 8.6–10.2)
Chloride: 99 mmol/L (ref 96–106)
Creatinine, Ser: 1.59 mg/dL — ABNORMAL HIGH (ref 0.76–1.27)
Globulin, Total: 2.8 g/dL (ref 1.5–4.5)
Glucose: 91 mg/dL (ref 70–99)
Potassium: 4.2 mmol/L (ref 3.5–5.2)
Sodium: 132 mmol/L — ABNORMAL LOW (ref 134–144)
Total Protein: 6.1 g/dL (ref 6.0–8.5)
eGFR: 41 mL/min/1.73 — ABNORMAL LOW (ref >59)

## 2024-01-29 ENCOUNTER — Telehealth: Payer: Self-pay

## 2024-01-29 NOTE — Progress Notes (Signed)
 BNP which is a measurement of pressure/fluid around heart has improved a lot! GREAT news.

## 2024-01-29 NOTE — Telephone Encounter (Signed)
 Copied from CRM #[id number]. Topic: Clinical - Medical Advice >> Jan 29, 2024 10:27 AM Anairis L wrote: Reason for CRM: Pt received a vm regarding x-ray results, would like a call back.

## 2024-01-29 NOTE — Telephone Encounter (Signed)
 Patient advised of results.

## 2024-02-08 DIAGNOSIS — R0600 Dyspnea, unspecified: Secondary | ICD-10-CM | POA: Diagnosis not present

## 2024-02-08 DIAGNOSIS — I10 Essential (primary) hypertension: Secondary | ICD-10-CM | POA: Diagnosis not present

## 2024-02-08 DIAGNOSIS — E785 Hyperlipidemia, unspecified: Secondary | ICD-10-CM | POA: Diagnosis not present

## 2024-02-08 DIAGNOSIS — I359 Nonrheumatic aortic valve disorder, unspecified: Secondary | ICD-10-CM | POA: Diagnosis not present

## 2024-02-09 DIAGNOSIS — R0600 Dyspnea, unspecified: Secondary | ICD-10-CM | POA: Diagnosis not present

## 2024-02-16 DIAGNOSIS — T751XXS Unspecified effects of drowning and nonfatal submersion, sequela: Secondary | ICD-10-CM | POA: Diagnosis not present

## 2024-02-16 DIAGNOSIS — J4540 Moderate persistent asthma, uncomplicated: Secondary | ICD-10-CM | POA: Diagnosis not present

## 2024-02-16 DIAGNOSIS — R0902 Hypoxemia: Secondary | ICD-10-CM | POA: Diagnosis not present

## 2024-02-16 DIAGNOSIS — Z1330 Encounter for screening examination for mental health and behavioral disorders, unspecified: Secondary | ICD-10-CM | POA: Diagnosis not present

## 2024-02-16 DIAGNOSIS — I38 Endocarditis, valve unspecified: Secondary | ICD-10-CM | POA: Diagnosis not present

## 2024-02-16 DIAGNOSIS — J479 Bronchiectasis, uncomplicated: Secondary | ICD-10-CM | POA: Diagnosis not present

## 2024-02-26 DIAGNOSIS — Z23 Encounter for immunization: Secondary | ICD-10-CM | POA: Diagnosis not present

## 2024-03-16 DIAGNOSIS — J479 Bronchiectasis, uncomplicated: Secondary | ICD-10-CM | POA: Diagnosis not present

## 2024-03-16 DIAGNOSIS — R0902 Hypoxemia: Secondary | ICD-10-CM | POA: Diagnosis not present

## 2024-03-16 DIAGNOSIS — J4540 Moderate persistent asthma, uncomplicated: Secondary | ICD-10-CM | POA: Diagnosis not present

## 2024-05-04 ENCOUNTER — Telehealth: Payer: Self-pay | Admitting: Physician Assistant

## 2024-05-04 NOTE — Telephone Encounter (Signed)
 Needs appt to establish care but if there is a medication he is in urgent need of we can get refills until then.

## 2024-05-04 NOTE — Telephone Encounter (Signed)
 Patient is requesting refill was Dr. Nathalie patient

## 2024-09-01 ENCOUNTER — Ambulatory Visit
